# Patient Record
Sex: Male | Born: 1935 | Race: White | Hispanic: No | Marital: Married | Smoking: Former smoker
Health system: Southern US, Community
[De-identification: ages and names within clinical notes are randomized; demographics above are authoritative.]

## PROBLEM LIST (undated history)

## (undated) DIAGNOSIS — E7800 Pure hypercholesterolemia, unspecified: Secondary | ICD-10-CM

## (undated) DIAGNOSIS — IMO0002 Reserved for concepts with insufficient information to code with codable children: Secondary | ICD-10-CM

## (undated) DIAGNOSIS — R011 Cardiac murmur, unspecified: Secondary | ICD-10-CM

## (undated) DIAGNOSIS — M109 Gout, unspecified: Secondary | ICD-10-CM

## (undated) DIAGNOSIS — I10 Essential (primary) hypertension: Secondary | ICD-10-CM

## (undated) DIAGNOSIS — I714 Abdominal aortic aneurysm, without rupture, unspecified: Secondary | ICD-10-CM

## (undated) DIAGNOSIS — I4891 Unspecified atrial fibrillation: Secondary | ICD-10-CM

## (undated) DIAGNOSIS — I4892 Unspecified atrial flutter: Secondary | ICD-10-CM

## (undated) DIAGNOSIS — D126 Benign neoplasm of colon, unspecified: Secondary | ICD-10-CM

## (undated) DIAGNOSIS — R768 Other specified abnormal immunological findings in serum: Secondary | ICD-10-CM

## (undated) DIAGNOSIS — K529 Noninfective gastroenteritis and colitis, unspecified: Secondary | ICD-10-CM

## (undated) DIAGNOSIS — Z8711 Personal history of peptic ulcer disease: Secondary | ICD-10-CM

## (undated) DIAGNOSIS — Z8719 Personal history of other diseases of the digestive system: Secondary | ICD-10-CM

## (undated) HISTORY — DX: Abdominal aortic aneurysm, without rupture, unspecified: I71.40

## (undated) HISTORY — DX: Pure hypercholesterolemia, unspecified: E78.00

## (undated) HISTORY — PX: HEMORRHOID SURGERY: SHX153

## (undated) HISTORY — DX: Gout, unspecified: M10.9

## (undated) HISTORY — PX: OTHER SURGICAL HISTORY: SHX169

## (undated) HISTORY — DX: Unspecified atrial fibrillation: I48.91

## (undated) HISTORY — DX: Other specified abnormal immunological findings in serum: R76.8

## (undated) HISTORY — DX: Reserved for concepts with insufficient information to code with codable children: IMO0002

## (undated) HISTORY — DX: Unspecified atrial flutter: I48.92

## (undated) HISTORY — DX: Essential (primary) hypertension: I10

## (undated) HISTORY — DX: Personal history of peptic ulcer disease: Z87.11

## (undated) HISTORY — DX: Cardiac murmur, unspecified: R01.1

## (undated) HISTORY — DX: Benign neoplasm of colon, unspecified: D12.6

---
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
## 1998-02-24 DIAGNOSIS — D126 Benign neoplasm of colon, unspecified: Secondary | ICD-10-CM

## 1998-02-24 HISTORY — DX: Benign neoplasm of colon, unspecified: D12.6

## 2010-07-23 ENCOUNTER — Telehealth: Payer: Self-pay

## 2010-07-23 NOTE — Telephone Encounter (Signed)
Pt called back to schedule colonoscopy. He has hx of polyps. Appt made to see Lorenza Burton, NP on 07/29/2010 @ 11:00 AM. Pt was asked to bring his insurance cards and  Medications/list and arrive 15 min early.

## 2010-07-26 HISTORY — PX: COLONOSCOPY: SHX174

## 2010-07-29 ENCOUNTER — Ambulatory Visit (INDEPENDENT_AMBULATORY_CARE_PROVIDER_SITE_OTHER): Payer: Medicare Other | Admitting: Urgent Care

## 2010-07-29 ENCOUNTER — Encounter: Payer: Self-pay | Admitting: Urgent Care

## 2010-07-29 VITALS — BP 120/68 | HR 55 | Temp 97.8°F | Ht 69.0 in | Wt 194.2 lb

## 2010-07-29 DIAGNOSIS — Z86010 Personal history of colonic polyps: Secondary | ICD-10-CM | POA: Insufficient documentation

## 2010-07-29 DIAGNOSIS — D126 Benign neoplasm of colon, unspecified: Secondary | ICD-10-CM

## 2010-07-29 NOTE — Assessment & Plan Note (Deleted)
Juan Quinn is a 77 y.o. male w/ hx adenomatous colon polyps found 12 years ago.  Due for surveillance colonoscopy.  Denies any GI concerns today.  Colonoscopy w/ Dr Rourk in the near future.     I have discussed risks & benefits which include, but are not limited to, bleeding, infection, perforation & drug reaction.  The patient agrees with this plan & written consent will be obtained.    

## 2010-07-29 NOTE — Assessment & Plan Note (Signed)
Juan Quinn is a 77 y.o. male w/ hx adenomatous colon polyps found 12 years ago.  Due for surveillance colonoscopy.  Denies any GI concerns today.  Colonoscopy w/ Dr Jena Gauss in the near future.     I have discussed risks & benefits which include, but are not limited to, bleeding, infection, perforation & drug reaction.  The patient agrees with this plan & written consent will be obtained.

## 2010-07-29 NOTE — Progress Notes (Signed)
Cc to PCP 

## 2010-07-29 NOTE — Progress Notes (Signed)
Primary Care Physician:  Isabella Stalling, MD Primary Gastroenterologist:  Dr. Jena Gauss  Chief Complaint  Patient presents with  . Colon Cancer Screening    hx polyps    HPI:  Juan Quinn is a 77 y.o. male here as a referral from Dr. Janna Arch for colonoscopy.  12 yr ago dx colon polyps & had 3-4 polyps removed.  Has had all normal colonoscopies since then about every 5 yr in [place], [place] Dr. Rubye Oaks (surgeon).  Denies abd pain, nausea, vomiting, diarrhea, or constipation.  Wt stable.  Appetite ok.  Denies heartburn or indigestion.     Past Medical History  Diagnosis Date  . HTN (hypertension)   . Hypercholesteremia   . Squamous cell carcinoma   . Adenomatous colon polyp 2000    Last colonoscopy [facility] 03/24/03 Dr. Jen Mow normal except 1 benign polyp. states 3-4 polyps removed 12  yrs ago    Past Surgical History  Procedure Date  . Shoulder   . Hemorrhoid surgery     Current Outpatient Prescriptions  Medication Sig Dispense Refill  . allopurinol (ZYLOPRIM) 100 MG tablet Take 100 mg by mouth daily.        Marland Kitchen aspirin 81 MG tablet Take 81 mg by mouth daily.        Marland Kitchen atenolol (TENORMIN) 25 MG tablet Take 25 mg by mouth daily.        . Cholecalciferol (VITAMIN D3) 1000 UNITS CAPS Take 1 capsule by mouth daily.        . Cyanocobalamin (B-12 PO) Take 500 mcg by mouth daily.        Marland Kitchen docusate sodium (COLACE) 100 MG capsule Take 100 mg by mouth 2 (two) times daily.        . fish oil-omega-3 fatty acids 1000 MG capsule Take 1 g by mouth daily.        Marland Kitchen lisinopril (PRINIVIL,ZESTRIL) 10 MG tablet Take 10 mg by mouth daily.        . simvastatin (ZOCOR) 40 MG tablet Take 40 mg by mouth at bedtime.          Allergies as of 07/29/2010  . (No Known Allergies)    Family History  Problem Relation Age of Onset  . Pancreatic cancer Sister   . Leukemia Brother   . Lung cancer Sister   . Stroke Mother   . Stroke Father     History   Social History  . Marital Status: Married   Spouse Name: N/A    Number of Children: 4  . Years of Education: N/A   Occupational History  . retired     Airline pilot   Social History Main Topics  . Smoking status: Former Smoker -- 2.0 packs/day for 50 years    Types: Cigarettes    Quit date: 07/28/2000  . Smokeless tobacco: Former Neurosurgeon    Quit date: 02/25/1999  . Alcohol Use: Yes     couple glasses wine/week, occ beer o rmixed drink  . Drug Use: 2 per week  . Sexually Active: Not on file   Other Topics Concern  . Not on file   Social History Narrative   Lives w/ wife    Review of Systems: Gen: Denies any fever, chills, sweats, anorexia, fatigue, weakness, malaise, weight loss, and sleep disorder CV: Denies chest pain, angina, palpitations, syncope, orthopnea, PND, peripheral edema, and claudication. Resp: Denies dyspnea at rest, dyspnea with exercise, cough, sputum, wheezing, coughing up blood, and pleurisy. GI: Denies vomiting blood, jaundice, and  fecal incontinence.   Denies dysphagia or odynophagia. GU : Denies urinary burning, blood in urine, urinary frequency, urinary hesitancy, nocturnal urination, and urinary incontinence. MS: Denies joint pain, limitation of movement, and swelling, stiffness, low back pain, extremity pain. Denies muscle weakness, cramps, atrophy.  Derm: Denies rash, itching, dry skin, hives, moles, warts, or unhealing ulcers.  Psych: Denies depression, anxiety, memory loss, suicidal ideation, hallucinations, paranoia, and confusion. Heme: Denies bruising, bleeding, and enlarged lymph nodes.  Physical Exam: BP 120/68  Pulse 55  Temp(Src) 97.8 F (36.6 C) (Temporal)  Ht 5\' 9"  (1.753 m)  Wt 194 lb 3.2 oz (88.089 kg)  BMI 28.68 kg/m2 General:   Alert,  Well-developed, well-nourished, pleasant and cooperative in NAD Head:  Normocephalic and atraumatic. Eyes:  Sclera clear, no icterus.   Conjunctiva pink. Ears:  Normal auditory acuity. Nose:  No deformity, discharge,  or lesions. Mouth:  No deformity  or lesions, dentition normal. Neck:  Supple; no masses or thyromegaly. Lungs:  Clear throughout to auscultation.   No wheezes, crackles, or rhonchi. No acute distress. Heart:  Regular rate and rhythm; no murmurs, clicks, rubs,  or gallops. Abdomen:  Soft, nontender and nondistended. No masses, hepatosplenomegaly or hernias noted. Normal bowel sounds, without guarding, and without rebound.   Rectal:  Deferred until time of colonoscopy.   Msk:  Symmetrical without gross deformities. Normal posture. Pulses:  Normal pulses noted. Extremities:  Without clubbing or edema. Neurologic:  Alert and  oriented x4;  grossly normal neurologically. Skin:  Intact without significant lesions or rashes. Cervical Nodes:  No significant cervical adenopathy. Psych:  Alert and cooperative. Normal mood and affect.

## 2010-08-07 ENCOUNTER — Encounter: Payer: Medicare Other | Admitting: Internal Medicine

## 2010-08-07 ENCOUNTER — Ambulatory Visit (HOSPITAL_COMMUNITY)
Admission: RE | Admit: 2010-08-07 | Discharge: 2010-08-07 | Disposition: A | Discharge status: Home / Self Care | Payer: Medicare Other | Source: Ambulatory Visit | Attending: Internal Medicine | Admitting: Internal Medicine

## 2010-08-07 ENCOUNTER — Other Ambulatory Visit: Payer: Self-pay | Admitting: Internal Medicine

## 2010-08-07 DIAGNOSIS — K5730 Diverticulosis of large intestine without perforation or abscess without bleeding: Secondary | ICD-10-CM | POA: Insufficient documentation

## 2010-08-07 DIAGNOSIS — Z860100 Personal history of colon polyps, unspecified: Secondary | ICD-10-CM

## 2010-08-07 DIAGNOSIS — D126 Benign neoplasm of colon, unspecified: Secondary | ICD-10-CM | POA: Insufficient documentation

## 2010-08-07 DIAGNOSIS — Z7982 Long term (current) use of aspirin: Secondary | ICD-10-CM | POA: Insufficient documentation

## 2010-08-07 DIAGNOSIS — Z1211 Encounter for screening for malignant neoplasm of colon: Secondary | ICD-10-CM

## 2010-08-07 DIAGNOSIS — I10 Essential (primary) hypertension: Secondary | ICD-10-CM | POA: Insufficient documentation

## 2010-08-07 DIAGNOSIS — Z79899 Other long term (current) drug therapy: Secondary | ICD-10-CM | POA: Insufficient documentation

## 2010-08-07 DIAGNOSIS — E785 Hyperlipidemia, unspecified: Secondary | ICD-10-CM | POA: Insufficient documentation

## 2010-08-07 DIAGNOSIS — Z09 Encounter for follow-up examination after completed treatment for conditions other than malignant neoplasm: Secondary | ICD-10-CM | POA: Insufficient documentation

## 2010-08-09 ENCOUNTER — Encounter: Payer: Self-pay | Admitting: Internal Medicine

## 2010-08-11 ENCOUNTER — Encounter: Payer: Self-pay | Admitting: Internal Medicine

## 2010-09-09 NOTE — Op Note (Signed)
  NAMEKORDELL, JAFRI                   ACCOUNT NO.:  [id number]  MEDICAL RECORD NO.:  [id number]  LOCATION:  [facility]                          FACILITY:  [facility]  PHYSICIAN:  R. Roetta Sessions, MD FACP FACGDATE OF BIRTH:  1930/09/23  DATE OF PROCEDURE:  08/07/2010 DATE OF DISCHARGE:                              OPERATIVE REPORT   PROCEDURE:  Colonoscopy with biopsy and snare polypectomy.  HISTORY:  A 77 year old gentleman referred by Dr. Janna Arch for colonoscopy.  The patient has had a personal history of colonic polyps. He has had multiple colonoscopies elsewhere (particularly [place]).  Last exam was about 5 years ago.  Colonoscopy is now being done as surveillance maneuver.  Currently, no lower GI tract symptoms. Risks, benefits, limitations, alternatives, imponderables have been discussed, questions answered.  Please see the documentation medical record.  PROCEDURE NOTE:  O2 saturation, blood pressure, pulse, and respirations were monitored throughout the entirety of the procedure.  CONSCIOUS SEDATION:  Versed 4 mg IV and Demerol 50 mg IV in divided doses.  INSTRUMENT:  Pentax video chip system.  FINDINGS:  Digital rectal exam revealed no abnormalities.  Endoscopic findings, prep was adequate.  Colon:  Colonic mucosa surveyed from the rectosigmoid junction through the left transverse right colon to the appendiceal orifice, ileocecal valve/cecum.  These structures were well seen and photographed for the record.  From this level, scope was slowly withdrawn and all previously mentioned mucosal surfaces were again seen. The fascia and long redundant colon requiring external abdominal pressure to reach the cecum.  The patient had pancolonic diverticula. The patient had 2 diminutive polyps in the cecum which were cold biopsied/removed.  At the hepatic flexure, descending segments over 5 mm polyps and soft stricture cold snared.  Remaining colonic mucosa appeared normal.  Scope was  pulled down in the rectum where thorough examination of the rectal mucosa including retroflex view of the anal verge demonstrated no abnormalities.  The patient tolerated the procedure well.  Cecal withdrawal time 15 minutes.  IMPRESSION: 1. Normal rectum. 2. Redundant elongated colon, pancolonic diverticular disease.  Cecal,     hepatic flexure, and descending colon polyps removed as described     above.  RECOMMENDATIONS: 1. Polyp and diverticulosis literature provided to Mr. Kollman. 2. Follow up on path. 3. Further recommendations to follow.     Jonathon Bellows, MD [facility]     RMR/MEDQ  D:  08/07/2010  T:  08/07/2010  Job:  [id number]  cc:   Melvyn Novas, MD Fax: [phone]  Electronically Signed by Lorrin Goodell M.D. on 09/09/2010 09:12:18 AM

## 2010-10-01 ENCOUNTER — Emergency Department (HOSPITAL_COMMUNITY): Payer: Medicare Other

## 2010-10-01 ENCOUNTER — Encounter (HOSPITAL_COMMUNITY): Payer: Self-pay | Admitting: *Deleted

## 2010-10-01 ENCOUNTER — Emergency Department (HOSPITAL_COMMUNITY)
Admission: EM | Admit: 2010-10-01 | Discharge: 2010-10-02 | Disposition: A | Discharge status: Home / Self Care | Payer: Medicare Other | Attending: Emergency Medicine | Admitting: Emergency Medicine

## 2010-10-01 DIAGNOSIS — Z859 Personal history of malignant neoplasm, unspecified: Secondary | ICD-10-CM | POA: Insufficient documentation

## 2010-10-01 DIAGNOSIS — E7800 Pure hypercholesterolemia, unspecified: Secondary | ICD-10-CM | POA: Insufficient documentation

## 2010-10-01 DIAGNOSIS — Z860100 Personal history of colon polyps, unspecified: Secondary | ICD-10-CM | POA: Insufficient documentation

## 2010-10-01 DIAGNOSIS — Y92009 Unspecified place in unspecified non-institutional (private) residence as the place of occurrence of the external cause: Secondary | ICD-10-CM | POA: Insufficient documentation

## 2010-10-01 DIAGNOSIS — S43016A Anterior dislocation of unspecified humerus, initial encounter: Secondary | ICD-10-CM | POA: Insufficient documentation

## 2010-10-01 DIAGNOSIS — I10 Essential (primary) hypertension: Secondary | ICD-10-CM | POA: Insufficient documentation

## 2010-10-01 DIAGNOSIS — Z87891 Personal history of nicotine dependence: Secondary | ICD-10-CM | POA: Insufficient documentation

## 2010-10-01 DIAGNOSIS — W010XXA Fall on same level from slipping, tripping and stumbling without subsequent striking against object, initial encounter: Secondary | ICD-10-CM | POA: Insufficient documentation

## 2010-10-01 DIAGNOSIS — S43005A Unspecified dislocation of left shoulder joint, initial encounter: Secondary | ICD-10-CM

## 2010-10-01 MED ORDER — ONDANSETRON HCL 4 MG/2ML IJ SOLN
4.0000 mg | Freq: Once | INTRAMUSCULAR | Status: AC
  Administered 2010-10-01: 4 mg via INTRAVENOUS

## 2010-10-01 MED ORDER — ETOMIDATE 2 MG/ML IV SOLN: 10.0000 mg | Freq: Once | INTRAVENOUS | Status: DC

## 2010-10-01 MED ORDER — ONDANSETRON HCL 4 MG/2ML IJ SOLN
INTRAMUSCULAR | Status: AC
  Administered 2010-10-01: 4 mg
  Filled 2010-10-01: qty 2

## 2010-10-01 MED ORDER — HYDROMORPHONE HCL 1 MG/ML IJ SOLN
INTRAMUSCULAR | Status: AC
  Administered 2010-10-01: 1 mg
  Filled 2010-10-01: qty 1

## 2010-10-01 MED ORDER — ETOMIDATE 2 MG/ML IV SOLN
INTRAVENOUS | Status: AC
  Administered 2010-10-01: 10 mg
  Filled 2010-10-01: qty 10

## 2010-10-01 MED ORDER — HYDROMORPHONE HCL 1 MG/ML IJ SOLN
1.0000 mg | Freq: Once | INTRAMUSCULAR | Status: AC
  Administered 2010-10-01: 1 mg via INTRAVENOUS
  Filled 2010-10-01: qty 1

## 2010-10-01 NOTE — ED Provider Notes (Signed)
History     CSN: [id number] Arrival date & time: 10/01/2010 10:05 PM  Chief Complaint  Patient presents with  . Illegal value: [    poss. dislocated shoulder   HPI Comments: Patient is a 77 year old male who presents with left shoulder injury approximately 1.5 hours prior to arrival. He states that he was pushing his garbage can when he tripped and fell on an outstretched hand with a straight elbow causing acute onset pain in his left shoulder. Pain is constant, moderate, worse with movement and associated with mild tingling of the fingers. He states that he has had this happen one time in the past. He denies other injuries at this time. He has had no medications prior to arrival  The history is provided by the patient and the spouse.    Past Medical History  Diagnosis Date  . HTN (hypertension)   . Hypercholesteremia   . Squamous cell carcinoma   . Adenomatous colon polyp 2000    Last colonoscopy [facility] 03/24/03 Dr. Jen Mow normal except 1 benign polyp. states 3-4 polyps removed 12  yrs ago    Past Surgical History  Procedure Date  . Shoulder   . Hemorrhoid surgery     Family History  Problem Relation Age of Onset  . Pancreatic cancer Sister   . Leukemia Brother   . Lung cancer Sister   . Stroke Mother   . Stroke Father     History  Substance Use Topics  . Smoking status: Former Smoker -- 2.0 packs/day for 50 years    Types: Cigarettes    Quit date: 07/28/2000  . Smokeless tobacco: Former Neurosurgeon    Quit date: 02/25/1999  . Alcohol Use: Yes     couple glasses wine/week, occ beer o rmixed drink      Review of Systems  Constitutional: Negative for fever and chills.  HENT: Negative for sore throat and neck pain.   Eyes: Negative for visual disturbance.  Respiratory: Negative for cough and shortness of breath.   Cardiovascular: Negative for chest pain.  Gastrointestinal: Negative for nausea, vomiting, abdominal pain and diarrhea.  Genitourinary: Negative for  dysuria and frequency.  Musculoskeletal: Negative for back pain.       Shoulder pain  Skin: Negative for rash.  Neurological: Positive for numbness. Negative for weakness and headaches.  Hematological: Negative for adenopathy.  Psychiatric/Behavioral: Negative for behavioral problems.    Physical Exam  BP 144/61  Pulse 50  Temp(Src) 98.2 F (36.8 C) (Oral)  Resp 20  Ht 5\' 9"  (1.753 m)  Wt 190 lb (86.183 kg)  BMI 28.06 kg/m2  SpO2 95%  Physical Exam  Nursing note and vitals reviewed. Constitutional: He appears well-developed and well-nourished. No distress.  HENT:  Head: Normocephalic and atraumatic.  Mouth/Throat: Oropharynx is clear and moist. No oropharyngeal exudate.  Eyes: Conjunctivae and EOM are normal. Pupils are equal, round, and reactive to light. Right eye exhibits no discharge. Left eye exhibits no discharge. No scleral icterus.  Neck: Normal range of motion. Neck supple. No JVD present. No thyromegaly present.  Cardiovascular: Normal rate, regular rhythm, normal heart sounds and intact distal pulses.  Exam reveals no gallop and no friction rub.   No murmur heard. Pulmonary/Chest: Effort normal and breath sounds normal. No respiratory distress. He has no wheezes. He has no rales.  Abdominal: Soft. Bowel sounds are normal. He exhibits no distension and no mass. There is no tenderness.  Musculoskeletal: Normal range of motion. He exhibits tenderness. He  exhibits no edema.       Left shoulder deformity with decreased range of motion of the left arm. Normal pulses at the left radial artery, normal grip the left hand. No other extremity injuries.  Lymphadenopathy:    He has no cervical adenopathy.  Neurological: He is alert. Coordination normal.       Sensation intact the left hand at this time.  Skin: Skin is warm and dry. No rash noted. No erythema.  Psychiatric: He has a normal mood and affect. His behavior is normal.    ED Course  Reduction of  dislocation Date/Time: 10/01/2010 11:34 PM Performed by: Eber Hong D Authorized by: Eber Hong D Consent: Verbal consent obtained. Written consent obtained. Risks and benefits: risks, benefits and alternatives were discussed Consent given by: patient Patient understanding: patient states understanding of the procedure being performed Patient consent: the patient's understanding of the procedure matches consent given Procedure consent: procedure consent matches procedure scheduled Relevant documents: relevant documents present and verified Test results: test results available and properly labeled Imaging studies: imaging studies available Patient identity confirmed: verbally with patient and arm band Time out: Immediately prior to procedure a "time out" was called to verify the correct patient, procedure, equipment, support staff and site/side marked as required. Patient sedated: yes Sedation type: moderate (conscious) sedation Sedatives: etomidate Sedation start date/time: 10/01/2010 11:20 PM Sedation end date/time: 10/01/2010 11:35 PM Vitals: Vital signs were monitored during sedation. Patient tolerance: Patient tolerated the procedure well with no immediate complications. Comments: Dislocation of left shoulder reduced with traction countertraction and manipulation of the left shoulder. The shoulder was relocated easily and without any discomfort. Patient arose from sedation at his baseline mental status. Post reduction films ordered.    MDM Apparent left shoulder dislocation, will confirm with x-ray, opiate analgesia was given intravenously. Patient moved from hallway into examination room from likely relocation procedure.  Dg Shoulder Left  10/01/2010  *RADIOLOGY REPORT*  Clinical Data: Larey Seat.  Left shoulder pain.  LEFT SHOULDER - 2+ VIEW  Comparison: None  Findings: There is an anterior subcoracoid dislocation of the left humeral head.  No definite associated fracture.  The left lung  apex is clear.  IMPRESSION: Anterior subcoracoid dislocation of the humeral head.  Original Report Authenticated By: P. Loralie Champagne, M.D.    Postreduction films reveal normal alignment without fracture. I have personally placed a shoulder immobilizer the patient tolerated without difficulty. Sensation is completely worn off the patient is ready for discharge.  Vida Roller, MD 10/02/10 [phone]

## 2010-10-01 NOTE — ED Notes (Signed)
Patient thinks he may have dislocated left shoulder, unable to move arm

## 2010-10-02 MED ORDER — ONDANSETRON HCL 4 MG/2ML IJ SOLN
INTRAMUSCULAR | Status: AC
  Administered 2010-10-02: 4 mg
  Filled 2010-10-02: qty 2

## 2010-10-02 MED ORDER — HYDROCODONE-ACETAMINOPHEN 5-500 MG PO TABS: 1.0000 | ORAL_TABLET | Freq: Four times a day (QID) | ORAL | Status: AC | PRN

## 2012-12-14 ENCOUNTER — Other Ambulatory Visit (HOSPITAL_COMMUNITY): Payer: Self-pay | Admitting: *Deleted

## 2012-12-14 ENCOUNTER — Other Ambulatory Visit (HOSPITAL_COMMUNITY): Payer: Self-pay | Admitting: Family Medicine

## 2012-12-14 DIAGNOSIS — N644 Mastodynia: Secondary | ICD-10-CM

## 2012-12-29 ENCOUNTER — Ambulatory Visit (HOSPITAL_COMMUNITY)
Admission: RE | Admit: 2012-12-29 | Discharge: 2012-12-29 | Disposition: A | Discharge status: Home / Self Care | Payer: Medicare Other | Source: Ambulatory Visit | Attending: Family Medicine | Admitting: Family Medicine

## 2012-12-29 DIAGNOSIS — N644 Mastodynia: Secondary | ICD-10-CM

## 2012-12-29 DIAGNOSIS — N62 Hypertrophy of breast: Secondary | ICD-10-CM | POA: Insufficient documentation

## 2013-02-24 DIAGNOSIS — R768 Other specified abnormal immunological findings in serum: Secondary | ICD-10-CM

## 2013-02-24 HISTORY — DX: Other specified abnormal immunological findings in serum: R76.8

## 2013-05-23 ENCOUNTER — Inpatient Hospital Stay (HOSPITAL_COMMUNITY)
Admission: EM | Admit: 2013-05-23 | Discharge: 2013-05-27 | DRG: 378 | Disposition: A | Discharge status: Home / Self Care | Payer: Medicare Other | Attending: Family Medicine | Admitting: Family Medicine

## 2013-05-23 ENCOUNTER — Encounter (HOSPITAL_COMMUNITY): Payer: Self-pay | Admitting: Emergency Medicine

## 2013-05-23 ENCOUNTER — Encounter (HOSPITAL_COMMUNITY)
Admission: EM | Disposition: A | Discharge status: Home / Self Care | Payer: Self-pay | Source: Home / Self Care | Attending: Family Medicine

## 2013-05-23 DIAGNOSIS — K279 Peptic ulcer, site unspecified, unspecified as acute or chronic, without hemorrhage or perforation: Secondary | ICD-10-CM

## 2013-05-23 DIAGNOSIS — Z66 Do not resuscitate: Secondary | ICD-10-CM | POA: Diagnosis present

## 2013-05-23 DIAGNOSIS — T3995XA Adverse effect of unspecified nonopioid analgesic, antipyretic and antirheumatic, initial encounter: Secondary | ICD-10-CM | POA: Diagnosis present

## 2013-05-23 DIAGNOSIS — I959 Hypotension, unspecified: Secondary | ICD-10-CM | POA: Diagnosis present

## 2013-05-23 DIAGNOSIS — Z79899 Other long term (current) drug therapy: Secondary | ICD-10-CM

## 2013-05-23 DIAGNOSIS — K259 Gastric ulcer, unspecified as acute or chronic, without hemorrhage or perforation: Secondary | ICD-10-CM | POA: Diagnosis present

## 2013-05-23 DIAGNOSIS — K254 Chronic or unspecified gastric ulcer with hemorrhage: Principal | ICD-10-CM | POA: Diagnosis present

## 2013-05-23 DIAGNOSIS — Z7982 Long term (current) use of aspirin: Secondary | ICD-10-CM

## 2013-05-23 DIAGNOSIS — E7800 Pure hypercholesterolemia, unspecified: Secondary | ICD-10-CM | POA: Diagnosis present

## 2013-05-23 DIAGNOSIS — K922 Gastrointestinal hemorrhage, unspecified: Secondary | ICD-10-CM

## 2013-05-23 DIAGNOSIS — K920 Hematemesis: Secondary | ICD-10-CM | POA: Diagnosis present

## 2013-05-23 DIAGNOSIS — N400 Enlarged prostate without lower urinary tract symptoms: Secondary | ICD-10-CM | POA: Diagnosis present

## 2013-05-23 DIAGNOSIS — D126 Benign neoplasm of colon, unspecified: Secondary | ICD-10-CM

## 2013-05-23 DIAGNOSIS — I10 Essential (primary) hypertension: Secondary | ICD-10-CM | POA: Diagnosis present

## 2013-05-23 DIAGNOSIS — Z806 Family history of leukemia: Secondary | ICD-10-CM

## 2013-05-23 DIAGNOSIS — Z801 Family history of malignant neoplasm of trachea, bronchus and lung: Secondary | ICD-10-CM

## 2013-05-23 DIAGNOSIS — K449 Diaphragmatic hernia without obstruction or gangrene: Secondary | ICD-10-CM | POA: Diagnosis present

## 2013-05-23 DIAGNOSIS — E785 Hyperlipidemia, unspecified: Secondary | ICD-10-CM | POA: Diagnosis present

## 2013-05-23 DIAGNOSIS — Z87891 Personal history of nicotine dependence: Secondary | ICD-10-CM

## 2013-05-23 DIAGNOSIS — Z800 Family history of malignant neoplasm of digestive organs: Secondary | ICD-10-CM

## 2013-05-23 DIAGNOSIS — Z823 Family history of stroke: Secondary | ICD-10-CM

## 2013-05-23 DIAGNOSIS — D62 Acute posthemorrhagic anemia: Secondary | ICD-10-CM | POA: Diagnosis present

## 2013-05-23 HISTORY — PX: ESOPHAGOGASTRODUODENOSCOPY: SHX5428

## 2013-05-23 LAB — COMPREHENSIVE METABOLIC PANEL
ALT: 7 U/L (ref 0–53)
AST: 12 U/L (ref 0–37)
Albumin: 2.6 g/dL — ABNORMAL LOW (ref 3.5–5.2)
Alkaline Phosphatase: 35 U/L — ABNORMAL LOW (ref 39–117)
BUN: 57 mg/dL — ABNORMAL HIGH (ref 6–23)
CALCIUM: 8.7 mg/dL (ref 8.4–10.5)
CO2: 26 mEq/L (ref 19–32)
CREATININE: 1.18 mg/dL (ref 0.50–1.35)
Chloride: 109 mEq/L (ref 96–112)
GFR calc non Af Amer: 56 mL/min — ABNORMAL LOW (ref >90)
GFR, EST AFRICAN AMERICAN: 65 mL/min — AB (ref >90)
GLUCOSE: 179 mg/dL — AB (ref 70–99)
Potassium: 5 mEq/L (ref 3.7–5.3)
SODIUM: 144 meq/L (ref 137–147)
TOTAL PROTEIN: 4.8 g/dL — AB (ref 6.0–8.3)
Total Bilirubin: 0.3 mg/dL (ref 0.3–1.2)

## 2013-05-23 LAB — CBC WITH DIFFERENTIAL/PLATELET
BASOS ABS: 0 10*3/uL (ref 0.0–0.1)
Basophils Relative: 0 % (ref 0–1)
EOS ABS: 0.2 10*3/uL (ref 0.0–0.7)
EOS PCT: 2 % (ref 0–5)
HCT: 27.4 % — ABNORMAL LOW (ref 39.0–52.0)
Hemoglobin: 9.5 g/dL — ABNORMAL LOW (ref 13.0–17.0)
LYMPHS ABS: 2.2 10*3/uL (ref 0.7–4.0)
Lymphocytes Relative: 25 % (ref 12–46)
MCH: 36.1 pg — AB (ref 26.0–34.0)
MCHC: 34.7 g/dL (ref 30.0–36.0)
MCV: 104.2 fL — AB (ref 78.0–100.0)
MONO ABS: 0.4 10*3/uL (ref 0.1–1.0)
Monocytes Relative: 4 % (ref 3–12)
Neutro Abs: 5.9 10*3/uL (ref 1.7–7.7)
Neutrophils Relative %: 69 % (ref 43–77)
PLATELETS: 119 10*3/uL — AB (ref 150–400)
RBC: 2.63 MIL/uL — ABNORMAL LOW (ref 4.22–5.81)
RDW: 12.7 % (ref 11.5–15.5)
WBC: 8.6 10*3/uL (ref 4.0–10.5)

## 2013-05-23 LAB — CBC
HEMATOCRIT: 26.2 % — AB (ref 39.0–52.0)
HEMATOCRIT: 25.8 % — AB (ref 39.0–52.0)
HEMOGLOBIN: 9.1 g/dL — AB (ref 13.0–17.0)
Hemoglobin: 9 g/dL — ABNORMAL LOW (ref 13.0–17.0)
MCH: 36 pg — ABNORMAL HIGH (ref 26.0–34.0)
MCH: 34.1 pg — AB (ref 26.0–34.0)
MCHC: 34.7 g/dL (ref 30.0–36.0)
MCHC: 34.9 g/dL (ref 30.0–36.0)
MCV: 103.6 fL — ABNORMAL HIGH (ref 78.0–100.0)
MCV: 97.7 fL (ref 78.0–100.0)
Platelets: 116 10*3/uL — ABNORMAL LOW (ref 150–400)
Platelets: 100 10*3/uL — ABNORMAL LOW (ref 150–400)
RBC: 2.53 MIL/uL — ABNORMAL LOW (ref 4.22–5.81)
RBC: 2.64 MIL/uL — AB (ref 4.22–5.81)
RDW: 12.7 % (ref 11.5–15.5)
RDW: 17.2 % — ABNORMAL HIGH (ref 11.5–15.5)
WBC: 9.8 10*3/uL (ref 4.0–10.5)
WBC: 7.8 10*3/uL (ref 4.0–10.5)

## 2013-05-23 LAB — LIPASE, BLOOD: LIPASE: 32 U/L (ref 11–59)

## 2013-05-23 LAB — APTT: APTT: 26 s (ref 24–37)

## 2013-05-23 LAB — LACTIC ACID, PLASMA: Lactic Acid, Venous: 4.2 mmol/L — ABNORMAL HIGH (ref 0.5–2.2)

## 2013-05-23 LAB — MRSA PCR SCREENING: MRSA by PCR: NEGATIVE

## 2013-05-23 LAB — PROTIME-INR
INR: 1.17 (ref 0.00–1.49)
PROTHROMBIN TIME: 14.7 s (ref 11.6–15.2)

## 2013-05-23 LAB — ABO/RH: ABO/RH(D): A POS

## 2013-05-23 LAB — POC OCCULT BLOOD, ED: FECAL OCCULT BLD: POSITIVE — AB

## 2013-05-23 SURGERY — EGD (ESOPHAGOGASTRODUODENOSCOPY)
Anesthesia: Moderate Sedation

## 2013-05-23 MED ORDER — METOCLOPRAMIDE HCL 5 MG/ML IJ SOLN
INTRAMUSCULAR | Status: AC
  Filled 2013-05-23: qty 2

## 2013-05-23 MED ORDER — SODIUM CHLORIDE 0.9 % IV SOLN
INTRAVENOUS | Status: AC
  Administered 2013-05-23: 12:00:00 via INTRAVENOUS

## 2013-05-23 MED ORDER — PANTOPRAZOLE SODIUM 40 MG IV SOLR
40.0000 mg | Freq: Once | INTRAVENOUS | Status: AC
  Administered 2013-05-23: 40 mg via INTRAVENOUS

## 2013-05-23 MED ORDER — MIDAZOLAM HCL 5 MG/5ML IJ SOLN
INTRAMUSCULAR | Status: AC
  Filled 2013-05-23: qty 5

## 2013-05-23 MED ORDER — SODIUM CHLORIDE 0.9 % IV SOLN
INTRAVENOUS | Status: DC
Start: 2013-05-23 — End: 2013-05-27
  Administered 2013-05-24 (×2): via INTRAVENOUS

## 2013-05-23 MED ORDER — SODIUM CHLORIDE 0.9 % IV SOLN
8.0000 mg/h | INTRAVENOUS | Status: DC
  Filled 2013-05-23 (×3): qty 80

## 2013-05-23 MED ORDER — PANTOPRAZOLE SODIUM 40 MG IV SOLR: 40.0000 mg | Freq: Two times a day (BID) | INTRAVENOUS | Status: DC

## 2013-05-23 MED ORDER — IPRATROPIUM BROMIDE 0.03 % NA SOLN
2.0000 | Freq: Three times a day (TID) | NASAL | Status: DC
  Administered 2013-05-24 – 2013-05-26 (×5): 2 via NASAL
  Filled 2013-05-23: qty 15

## 2013-05-23 MED ORDER — LIDOCAINE VISCOUS 2 % MT SOLN
OROMUCOSAL | Status: AC
  Filled 2013-05-23: qty 15

## 2013-05-23 MED ORDER — PANTOPRAZOLE SODIUM 40 MG IV SOLR
40.0000 mg | Freq: Once | INTRAVENOUS | Status: AC
  Administered 2013-05-23: 40 mg via INTRAVENOUS
  Filled 2013-05-23: qty 40

## 2013-05-23 MED ORDER — SODIUM CHLORIDE 0.9 % IV BOLUS (SEPSIS)
1000.0000 mL | Freq: Once | INTRAVENOUS | Status: AC
  Administered 2013-05-23: 1000 mL via INTRAVENOUS

## 2013-05-23 MED ORDER — ALLOPURINOL 100 MG PO TABS
100.0000 mg | ORAL_TABLET | Freq: Every day | ORAL | Status: DC
  Administered 2013-05-23 – 2013-05-27 (×5): 100 mg via ORAL
  Filled 2013-05-23 (×6): qty 1

## 2013-05-23 MED ORDER — PANTOPRAZOLE SODIUM 40 MG IV SOLR
40.0000 mg | Freq: Once | INTRAVENOUS | Status: AC
  Filled 2013-05-23: qty 40

## 2013-05-23 MED ORDER — TAMSULOSIN HCL 0.4 MG PO CAPS
0.4000 mg | ORAL_CAPSULE | Freq: Every day | ORAL | Status: DC
  Administered 2013-05-23 – 2013-05-27 (×5): 0.4 mg via ORAL
  Filled 2013-05-23 (×5): qty 1

## 2013-05-23 MED ORDER — ACETAMINOPHEN 325 MG PO TABS
650.0000 mg | ORAL_TABLET | Freq: Four times a day (QID) | ORAL | Status: DC | PRN
Start: 2013-05-23 — End: 2013-05-27

## 2013-05-23 MED ORDER — MIDAZOLAM HCL 5 MG/5ML IJ SOLN
INTRAMUSCULAR | Status: DC | PRN
  Administered 2013-05-23 (×4): 1 mg via INTRAVENOUS
  Administered 2013-05-23: 2 mg via INTRAVENOUS
  Administered 2013-05-23 (×5): 1 mg via INTRAVENOUS

## 2013-05-23 MED ORDER — MIDAZOLAM HCL 5 MG/5ML IJ SOLN
INTRAMUSCULAR | Status: AC
  Filled 2013-05-23: qty 10

## 2013-05-23 MED ORDER — STERILE WATER FOR IRRIGATION IR SOLN
Status: DC | PRN
  Administered 2013-05-23: 14:00:00

## 2013-05-23 MED ORDER — METOCLOPRAMIDE HCL 5 MG/ML IJ SOLN
10.0000 mg | Freq: Once | INTRAMUSCULAR | Status: AC
  Administered 2013-05-24: 10 mg via INTRAVENOUS
  Filled 2013-05-23: qty 2

## 2013-05-23 MED ORDER — MEPERIDINE HCL 100 MG/ML IJ SOLN
INTRAMUSCULAR | Status: AC
  Filled 2013-05-23: qty 2

## 2013-05-23 MED ORDER — ONDANSETRON HCL 4 MG/2ML IJ SOLN
4.0000 mg | Freq: Once | INTRAMUSCULAR | Status: AC
  Administered 2013-05-23: 4 mg via INTRAVENOUS
  Filled 2013-05-23: qty 2

## 2013-05-23 MED ORDER — ONDANSETRON HCL 4 MG/2ML IJ SOLN
INTRAMUSCULAR | Status: AC
  Filled 2013-05-23: qty 2

## 2013-05-23 MED ORDER — METOCLOPRAMIDE HCL 5 MG/ML IJ SOLN
10.0000 mg | Freq: Once | INTRAMUSCULAR | Status: AC
  Administered 2013-05-23: 10 mg via INTRAVENOUS

## 2013-05-23 MED ORDER — ONDANSETRON HCL 4 MG/2ML IJ SOLN
INTRAMUSCULAR | Status: DC | PRN
  Administered 2013-05-23: 4 mg via INTRAVENOUS

## 2013-05-23 MED ORDER — SODIUM CHLORIDE 0.9 % IV SOLN
8.0000 mg/h | INTRAVENOUS | Status: DC
  Administered 2013-05-23 – 2013-05-24 (×3): 8 mg/h via INTRAVENOUS
  Filled 2013-05-23 (×5): qty 80

## 2013-05-23 MED ORDER — MEPERIDINE HCL 100 MG/ML IJ SOLN
INTRAMUSCULAR | Status: DC | PRN
  Administered 2013-05-23: 50 mg via INTRAVENOUS
  Administered 2013-05-23 (×2): 25 mg via INTRAVENOUS

## 2013-05-23 MED ORDER — LIDOCAINE VISCOUS 2 % MT SOLN
OROMUCOSAL | Status: DC | PRN
  Administered 2013-05-23: 3 mL via OROMUCOSAL

## 2013-05-23 MED ORDER — TERAZOSIN HCL 5 MG PO CAPS
5.0000 mg | ORAL_CAPSULE | Freq: Once | ORAL | Status: DC
  Filled 2013-05-23: qty 1

## 2013-05-23 MED ORDER — SODIUM CHLORIDE 0.9 % IJ SOLN
INTRAMUSCULAR | Status: AC
Start: 2013-05-23 — End: 2013-05-24
  Filled 2013-05-23: qty 10

## 2013-05-23 MED ORDER — ONDANSETRON HCL 4 MG/2ML IJ SOLN: 4.0000 mg | Freq: Four times a day (QID) | INTRAMUSCULAR | Status: DC | PRN

## 2013-05-23 MED ORDER — PANTOPRAZOLE SODIUM 40 MG IV SOLR: 40.0000 mg | Freq: Once | INTRAVENOUS | Status: DC

## 2013-05-23 MED ORDER — SODIUM CHLORIDE 0.9 % IV SOLN
INTRAVENOUS | Status: DC
  Administered 2013-05-23: 14:00:00 via INTRAVENOUS

## 2013-05-23 MED ORDER — SIMVASTATIN 20 MG PO TABS
20.0000 mg | ORAL_TABLET | Freq: Every day | ORAL | Status: DC
  Administered 2013-05-23 – 2013-05-26 (×4): 20 mg via ORAL
  Filled 2013-05-23 (×4): qty 1

## 2013-05-23 NOTE — ED Notes (Signed)
Patient arrives via EMS from home with c/o gi bleeding. Started this morning. Patient denies recent illness. Patient arrives saturated in bright red blood. Report nausea/vomiting.

## 2013-05-23 NOTE — Consult Note (Addendum)
Referring Provider: Dr. Karle Starch Dr. Primary Care Physician:  Dr. Cindie Laroche Primary Gastroenterologist:  Dr. Gala Romney   Date of Admission: 05/23/13 Date of Consultation: 05/23/13  Reason for Consultation:  Hematemesis  HPI:  Juan Quinn is an 80 year old male presenting to the ED via EMS from home secondary to acute onset of hematemeis. Hgb 9.5 on admission. Unknown baseline. No prior history of liver disease or prior EGD. No hx of PUD. Wife at bedside. Patient hemodynamically stable now; hypotensive on admission but now improved after NS boluses. Husband woke up this morning feeling clammy, nauseated. Went to bathroom and had large amount of bright red emesis. No abdominal pain, GERD, dysphagia, loss of appetite, or any other precipitating issues. Had episode of dark black stool on stretcher before leaving house with EMS. Takes baby aspirin daily. 2-3 Naprosyn tablets last Thursday. Took Ibuprofen sporadically while in [place] a few weeks ago. Feels improved since admission but still weak. No Goodys or BC powders. No PPI.    Past Medical History  Diagnosis Date  . HTN (hypertension)   . Hypercholesteremia   . Squamous cell carcinoma   . Adenomatous colon polyp 2000    Last colonoscopy [facility] 03/24/03 Dr. Rachelle Hora normal except 1 benign polyp. states 3-4 polyps removed 12  yrs ago    Past Surgical History  Procedure Laterality Date  . Shoulder    . Hemorrhoid surgery    . Colonoscopy  June 2012    Dr. Gala Romney: adenomatous polyps, due for surveillance if health permits in 1093    Prior to Admission medications   Medication Sig Start Date End Date Taking? Authorizing Provider  allopurinol (ZYLOPRIM) 100 MG tablet Take 100 mg by mouth daily.     Yes Historical Provider, MD  aspirin 81 MG tablet Take 81 mg by mouth daily.    Yes Historical Provider, MD  atenolol (TENORMIN) 25 MG tablet Take 25 mg by mouth daily.     Yes Historical Provider, MD  Cholecalciferol (VITAMIN D3) 1000 UNITS  /APS Take 1 capsule by mouth daily.     Yes Historical Provider, MD  Cyanocobalamin (B-12 PO) Take 500 mcg by mouth daily.     Yes Historical Provider, MD  fish oil-omega-3 fatty acids 1000 MG capsule Take 1 g by mouth daily.     Yes Historical Provider, MD  HYDROcodone-acetaminophen (NORCO/VICODIN) 5-325 MG per tablet Take 1 tablet by mouth daily. 05/18/13  Yes Historical Provider, MD  ipratropium (ATROVENT) 0.06 % nasal spray Place 2-3 sprays into both nostrils daily.  04/29/13  Yes Historical Provider, MD  lisinopril (PRINIVIL,ZESTRIL) 10 MG tablet Take 10 mg by mouth daily.     Yes Historical Provider, MD  naproxen (NAPROSYN) 500 MG tablet Take 1-2 tablets by mouth daily as needed for mild pain.  05/18/13  Yes Historical Provider, MD  simvastatin (ZOCOR) 40 MG tablet Take 20 mg by mouth every morning.    Yes Historical Provider, MD  tamsulosin (FLOMAX) 0.4 MG CAPS capsule Take 1 capsule by mouth daily. 04/08/13  Yes Historical Provider, MD  terazosin (HYTRIN) 5 MG capsule Take 5 mg by mouth once.   Yes Historical Provider, MD    No current facility-administered medications for this encounter.   Current Outpatient Prescriptions  Medication Sig Dispense Refill  . allopurinol (ZYLOPRIM) 100 MG tablet Take 100 mg by mouth daily.        Marland Kitchen aspirin 81 MG tablet Take 81 mg by mouth daily.       Marland Kitchen  atenolol (TENORMIN) 25 MG tablet Take 25 mg by mouth daily.        . Cholecalciferol (VITAMIN D3) 1000 UNITS CAPS Take 1 capsule by mouth daily.        . Cyanocobalamin (B-12 PO) Take 500 mcg by mouth daily.        . fish oil-omega-3 fatty acids 1000 MG capsule Take 1 g by mouth daily.        Marland Kitchen HYDROcodone-acetaminophen (NORCO/VICODIN) 5-325 MG per tablet Take 1 tablet by mouth daily.      Marland Kitchen ipratropium (ATROVENT) 0.06 % nasal spray Place 2-3 sprays into both nostrils daily.       Marland Kitchen lisinopril (PRINIVIL,ZESTRIL) 10 MG tablet Take 10 mg by mouth daily.        . naproxen (NAPROSYN) 500 MG tablet Take 1-2  tablets by mouth daily as needed for mild pain.       . simvastatin (ZOCOR) 40 MG tablet Take 20 mg by mouth every morning.       . tamsulosin (FLOMAX) 0.4 MG CAPS capsule Take 1 capsule by mouth daily.      Marland Kitchen terazosin (HYTRIN) 5 MG capsule Take 5 mg by mouth once.        Allergies as of 05/23/2013  . (No Known Allergies)    Family History  Problem Relation Age of Onset  . Pancreatic cancer Sister   . Leukemia Brother   . Lung cancer Sister   . Stroke Mother   . Stroke Father   . Colon cancer Neg Hx     History   Social History  . Marital Status: Married    Spouse Name: N/A    Number of Children: 4  . Years of Education: N/A   Occupational History  . retired     Press photographer   Social History Main Topics  . Smoking status: Former Smoker -- 2.00 packs/day for 50 years    Types: Cigarettes    Quit date: 07/28/2000  . Smokeless tobacco: Former Systems developer    Quit date: 02/25/1999  . Alcohol Use: Yes     Comment: couple glasses wine/week, occ beer or rmixed drink  . Drug Use: No  . Sexual Activity: Not on file   Other Topics Concern  . Not on file   Social History Narrative   Lives w/ wife          Review of Systems: As mentioned in HPI  Physical Exam: Vital signs in last 24 hours: Temp:  [97.5 F (36.4 C)] 97.5 F (36.4 C) (03/30 0823) Pulse Rate:  [79-98] 79 (03/30 1030) Resp:  [17-20] 19 (03/30 1030) BP: (93-102)/(43-58) 102/48 mmHg (03/30 1030) SpO2:  [98 %-100 %] 100 % (03/30 1030) Weight:  [210 lb (95.255 kg)] 210 lb (95.255 kg) (03/30 6010)   General:   Alert,  Well-developed, well-nourished, pleasant and cooperative in NAD Head:  Normocephalic and atraumatic. Eyes:  Sclera clear, no icterus.   Conjunctiva pink. Ears:  Normal auditory acuity. Nose:  No deformity, discharge,  or lesions. Mouth:  No deformity or lesions, edentulous Neck:  Supple; no masses or thyromegaly. Lungs:  Clear throughout to auscultation.   No wheezes, crackles, or rhonchi. No acute  distress. Heart:  S1 S2 present; no murmurs, clicks, rubs,  or gallops. Abdomen:  Soft, nontender and nondistended. No masses, hepatosplenomegaly or hernias noted. Normal bowel sounds, without guarding, and without rebound.   Rectal:  Deferred  Msk:  Symmetrical without gross deformities. Normal posture. Extremities:  Without clubbing or edema. Neurologic:  Alert and  oriented x4;  grossly normal neurologically. Skin:  Intact without significant lesions or rashes. Cervical Nodes:  No significant cervical adenopathy. Psych:  Alert and cooperative. Normal mood and affect.  Intake/Output from previous day:   Intake/Output this shift:    Lab Results:  Recent Labs  05/23/13 0830  WBC 8.6  HGB 9.5*  HCT 27.4*  PLT 119*   BMET  Recent Labs  05/23/13 0830  NA 144  K 5.0  CL 109  CO2 26  GLUCOSE 179*  BUN 57*  CREATININE 1.18  CALCIUM 8.7   LFT  Recent Labs  05/23/13 0830  PROT 4.8*  ALBUMIN 2.6*  AST 12  ALT 7  ALKPHOS 35*  BILITOT 0.3   PT/INR  Recent Labs  05/23/13 0830  LABPROT 14.7  INR 1.17   Impression: 80 year old male who appears much younger than stated age, presenting with acute episodes of large volume painless hematemesis and 1 episode of melena in the setting of sporadic NSAID use over the past few months. No prior known PUD or EGD, and he denies any known liver disease. Unknown baseline Hgb but admitting Hgb 9.5; hemodynamically improved since admission with fluid resuscitation and supportive measures. No further hematemesis since presentation to ED.   Discussed with both husband and wife the need for upper endoscopy for further evaluation. Both are in agreement and understand the risks and benefits. Need to keep 2 units PRBCs on hold, remain NPO, start Protonix IV BID. EGD later today with Dr. Gala Romney. Serial H/H.  Plan: NPO PPI IV EGD with Dr. Gala Romney this afternoon Supportive measures Keep 2 units PRBCs on hold Serial H/H   Orvil Feil,  ANP-BC [facility]     LOS: 0 days    05/23/2013, 11:05 AM  Attending note:  Patient seen and examined in short stay. NSAID gastropathy/peptic ulcer disease likely. Agree with need for EGD. The risks, benefits, limitations, alternatives and imponderables have been reviewed with the patient. Potential for , biopsy, etc. have also been reviewed.  Questions have been answered. All parties agreeable.

## 2013-05-23 NOTE — Progress Notes (Signed)
Blood transfusion  Completed at 1430.  Pt with no complications

## 2013-05-23 NOTE — ED Provider Notes (Signed)
CSN: [id number]     Arrival date & time    History  This chart was scribed for Juan Santilli B. Karle Starch, MD by Roe Coombs, ED Scribe. The patient was seen in room APA02/APA02. Patient's care was started at 8:13 AM.    Chief Complaint  Patient presents with  . GI Bleeding     The history is provided by the patient. No language interpreter was used.    HPI Comments: Juan Quinn is a 80 y.o. male who presents to the Emergency Department via EMS from home complaining of sudden onset hematemesis that began less than 1 hour ago while at home. He states that he saw bright red blood in the emesis contents. Patient reports associated nausea prior to the episodes of emesis which continues now. He did not try any treatments at home. Per nursing staff, patient had bright red blood around his mouth upon arrival in the ED. He denies any recent illnesses. He has no prior history of GI bleeds. He denies abdominal pain or any other symptoms at this time. He has a medical history of HTN, hypercholesteremia, squamous cell carcinoma and colon polyp. He denies any alcohol use.  PCP - Cindie Laroche   Past Medical History  Diagnosis Date  . HTN (hypertension)   . Hypercholesteremia   . Squamous cell carcinoma   . Adenomatous colon polyp 2000    Last colonoscopy [facility] 03/24/03 Dr. Rachelle Hora normal except 1 benign polyp. states 3-4 polyps removed 12  yrs ago   Past Surgical History  Procedure Laterality Date  . Shoulder    . Hemorrhoid surgery     Family History  Problem Relation Age of Onset  . Pancreatic cancer Sister   . Leukemia Brother   . Lung cancer Sister   . Stroke Mother   . Stroke Father    History  Substance Use Topics  . Smoking status: Former Smoker -- 2.00 packs/day for 50 years    Types: Cigarettes    Quit date: 07/28/2000  . Smokeless tobacco: Former Systems developer    Quit date: 02/25/1999  . Alcohol Use: Yes     Comment: couple glasses wine/week, occ beer o rmixed drink    Review of  Systems   A complete 10 system review of systems was obtained and all systems are negative except as noted in the HPI and PMH.     Allergies  Review of patient's allergies indicates no known allergies.  Home Medications   Current Outpatient Rx  Name  Route  Sig  Dispense  Refill  . allopurinol (ZYLOPRIM) 100 MG tablet   Oral   Take 100 mg by mouth daily.           Marland Kitchen aspirin 81 MG tablet   Oral   Take 81 mg by mouth every other day.          Marland Kitchen atenolol (TENORMIN) 25 MG tablet   Oral   Take 25 mg by mouth daily.           . Cholecalciferol (VITAMIN D3) 1000 UNITS CAPS   Oral   Take 1 capsule by mouth daily.           . Cyanocobalamin (B-12 PO)   Oral   Take 500 mcg by mouth daily.           Marland Kitchen docusate sodium (COLACE) 100 MG capsule   Oral   Take 100 mg by mouth 2 (two) times daily.           Marland Kitchen  fish oil-omega-3 fatty acids 1000 MG capsule   Oral   Take 1 g by mouth daily.           Marland Kitchen lisinopril (PRINIVIL,ZESTRIL) 10 MG tablet   Oral   Take 10 mg by mouth daily.           . simvastatin (ZOCOR) 40 MG tablet   Oral   Take 20 mg by mouth every morning.           Triage Vitals: BP 97/58  Pulse 86  Resp 17  Ht 5\' 9"  (1.753 m)  Wt 210 lb (95.255 kg)  BMI 31.00 kg/m2  SpO2 98% Physical Exam  Nursing note and vitals reviewed. Constitutional: He is oriented to person, place, and time. He appears well-developed and well-nourished.  HENT:  Head: Normocephalic and atraumatic.  Eyes: EOM are normal. Pupils are equal, round, and reactive to light.  Neck: Normal range of motion. Neck supple.  Cardiovascular: Normal rate, normal heart sounds and intact distal pulses.   Pulmonary/Chest: Effort normal and breath sounds normal.  Abdominal: Bowel sounds are normal. He exhibits no distension. There is no tenderness.  Musculoskeletal: Normal range of motion. He exhibits no edema and no tenderness.  Neurological: He is alert and oriented to person, place,  and time. He has normal strength. No cranial nerve deficit or sensory deficit.  Skin: Skin is warm and dry. No rash noted.  Psychiatric: He has a normal mood and affect.    ED Course  Procedures (including critical care time) DIAGNOSTIC STUDIES: Oxygen Saturation is 98% on room air, normal by my interpretation.    COORDINATION OF CARE: 8:16 AM- Will order IV fluids, Zofran, Protonix and labs. Patient informed of current plan for treatment and evaluation and agrees with plan at this time.   9:51 AM - Discussed lab results with patient. Hgb is low at 9.5 and patient is also hypotensive. Recommended admission and patient is in agreement with this. Will order consult to hospitalist.  10:37 AM - Spoke with Dr. Domenic Polite, hospitalist on call, who agrees to admit patient.   Labs Review Labs Reviewed  CBC WITH DIFFERENTIAL - Abnormal; Notable for the following:    RBC 2.63 (*)    Hemoglobin 9.5 (*)    HCT 27.4 (*)    MCV 104.2 (*)    MCH 36.1 (*)    Platelets 119 (*)    All other components within normal limits  COMPREHENSIVE METABOLIC PANEL - Abnormal; Notable for the following:    Glucose, Bld 179 (*)    BUN 57 (*)    Total Protein 4.8 (*)    Albumin 2.6 (*)    Alkaline Phosphatase 35 (*)    GFR calc non Af Amer 56 (*)    GFR calc Af Amer 65 (*)    All other components within normal limits  LACTIC ACID, PLASMA - Abnormal; Notable for the following:    Lactic Acid, Venous 4.2 (*)    All other components within normal limits  CBC - Abnormal; Notable for the following:    RBC 2.53 (*)    Hemoglobin 9.1 (*)    HCT 26.2 (*)    MCV 103.6 (*)    MCH 36.0 (*)    Platelets 116 (*)    All other components within normal limits  POC OCCULT BLOOD, ED - Abnormal; Notable for the following:    Fecal Occult Bld POSITIVE (*)    All other components within normal limits  MRSA PCR SCREENING  LIPASE, BLOOD  PROTIME-INR  APTT  CBC  TYPE AND SCREEN  ABO/RH  PREPARE RBC (CROSSMATCH)     Imaging Review No results found.   EKG Interpretation None      MDM   Final diagnoses:  Upper GI bleed    Upper GI bleeding has stopped for now. Mild hypotension but mentating well. Anemia not yet in need of transfusion. Admit for further eval. Dr. Gala Romney consulted for GI.   I personally performed the services described in this documentation, which was scribed in my presence. The recorded information has been reviewed and is accurate.      Alainna Stawicki B. Karle Starch, MD 05/23/13 [phone]

## 2013-05-23 NOTE — Op Note (Signed)
[facility] [address] [place], [address]   ENDOSCOPY PROCEDURE REPORT  PATIENT: Juan Quinn, Juan Quinn  MR#: [id number] BIRTHDATE: [birth year] , 67  yrs. old GENDER: Male ENDOSCOPIST: R.  Garfield Cornea, MD FACP [facility] REFERRED BY:     Hospitalist PROCEDURE DATE:  05/23/2013 PROCEDURE:     diagnostic EGD  INDICATIONS:      Hematemesis;  INFORMED CONSENT:   The risks, benefits, limitations, alternatives and imponderables have been discussed.  The potential for biopsy, esophogeal dilation, etc. have also been reviewed.  Questions have been answered.  All parties agreeable.  Please see the history and physical in the medical record for more information.  MEDICATIONS:     Demerol 100 mg IV and Versed 11 mg IV. Zofran 4 mg IV Reglan 10 mg IV viscous Xylocaine gel  DESCRIPTION OF PROCEDURE:   The VA-[id number] ([id number]) and [id number] ([id number])  endoscope was introduced through the mouth and advanced to the second portion of the duodenum without difficulty or limitations.  The mucosal surfaces were surveyed very carefully during advancement of the scope and upon withdrawal.  Retroflexion view of the proximal stomach and esophagogastric junction was performed.      FINDINGS:   Grossly normal esophagus. Proximal stomach full of old blood/clot food debris. Antrum seen fairly well and mucosa appeared normal pylorus patent and easily traversed examination bulb and second portion revealed no gross abnormalities. Attention turned to the proximal stomach. I spent a long period of time with the diagnostic and therapeutic scope alongs with the Biovac trying to get the clot and food debris out the proximal stomach. Using gravity, 2 scopes, the bio back. Eventually got most of this debris out of the stomach. During this lengthy procedure, I noted there was no active bleeding seen. And I did not identify a mucosal lesion which would explain bleeding.   However, visualization  was limited.  THERAPEUTIC / DIAGNOSTIC MANEUVERS PERFORMED:  none   COMPLICATIONS:  None  IMPRESSION:  Incomplete EGD secondary to much clot and food debris in the stomach  RECOMMENDATIONS:  IV PPI infusion. Sips of clear liquids. IV Reglan. Repeat EGD tomorrow.  I have discussed plans with family members.    _______________________________ R. Garfield Cornea, MD FACP [facility] eSigned:  R. Garfield Cornea, MD FACP [facility] 05/23/2013 4:46 PM     CC:  PATIENT NAME:  Juan Quinn, Juan Quinn MR#: [id number]

## 2013-05-23 NOTE — H&P (Signed)
[facility] History and Physical  Juan Quinn JSR:[id number] DOB: 11/04/1930 DOA: 05/23/2013  Referring physician: EDP PCP: [name], MD   Chief Complaint: vomiting blood  HPI: Juan Quinn is a 80 y.o. male with PMH of HTN, dyslipidemia presents to the ER today with above complaints. He woke up nauseous and this was followed by 3 episodes of vomiting, bright red blood and called EMS and came to the ER. Denies ETOH use. Used Few ibuprofen last week and some Naproxen this week for a pulled back muscle. In ER, hb 9.5, BP in 90s on admission, TR[facility] consulted for further management   Review of Systems:  Constitutional:  No weight loss, night sweats, Fevers, chills, fatigue.  HEENT:  No headaches, Difficulty swallowing,Tooth/dental problems,Sore throat,  No sneezing, itching, ear ache, nasal congestion, post nasal drip,  Cardio-vascular:  No chest pain, Orthopnea, PND, swelling in lower extremities, anasarca, dizziness, palpitations  GI:  No heartburn, indigestion, abdominal pain, nausea, vomiting, diarrhea, change in bowel habits, loss of appetite  Resp:  No shortness of breath with exertion or at rest. No excess mucus, no productive cough, No non-productive cough, No coughing up of blood.No change in color of mucus.No wheezing.No chest wall deformity  Skin:  no rash or lesions.  GU:  no dysuria, change in color of urine, no urgency or frequency. No flank pain.  Musculoskeletal:  No joint pain or swelling. No decreased range of motion. No back pain.  Psych:  No change in mood or affect. No depression or anxiety. No memory loss.   Past Medical History  Diagnosis Date  . HTN (hypertension)   . Hypercholesteremia   . Squamous cell carcinoma   . Adenomatous colon polyp 2000    Last colonoscopy [facility] 03/24/03 Dr. Rachelle Hora normal except 1 benign polyp. states 3-4 polyps removed 12  yrs ago   Past Surgical History  Procedure Laterality Date  . Shoulder    .  Hemorrhoid surgery    . Colonoscopy  June 2012    Dr. Gala Romney: adenomatous polyps, due for surveillance if health permits in 9244   Social History:  reports that he quit smoking about 12 years ago. His smoking use included Cigarettes. He has a 100 pack-year smoking history. He quit smokeless tobacco use about 14 years ago. He reports that he drinks alcohol. He reports that he does not use illicit drugs.  No Known Allergies  Family History  Problem Relation Age of Onset  . Pancreatic cancer Sister   . Leukemia Brother   . Lung cancer Sister   . Stroke Mother   . Stroke Father   . Colon cancer Neg Hx      Prior to Admission medications   Medication Sig Start Date End Date Taking? Authorizing Provider  allopurinol (ZYLOPRIM) 100 MG tablet Take 100 mg by mouth daily.     Yes Historical Provider, MD  aspirin 81 MG tablet Take 81 mg by mouth daily.    Yes Historical Provider, MD  atenolol (TENORMIN) 25 MG tablet Take 25 mg by mouth daily.     Yes Historical Provider, MD  Cholecalciferol (VITAMIN D3) 1000 UNITS CAPS Take 1 capsule by mouth daily.     Yes Historical Provider, MD  Cyanocobalamin (B-12 PO) Take 500 mcg by mouth daily.     Yes Historical Provider, MD  fish oil-omega-3 fatty acids 1000 MG capsule Take 1 g by mouth daily.     Yes Historical Provider, MD  HYDROcodone-acetaminophen (NORCO/VICODIN) 5-325 MG per  tablet Take 1 tablet by mouth daily. 05/18/13  Yes Historical Provider, MD  ipratropium (ATROVENT) 0.06 % nasal spray Place 2-3 sprays into both nostrils daily.  04/29/13  Yes Historical Provider, MD  lisinopril (PRINIVIL,ZESTRIL) 10 MG tablet Take 10 mg by mouth daily.     Yes Historical Provider, MD  naproxen (NAPROSYN) 500 MG tablet Take 1-2 tablets by mouth daily as needed for mild pain.  05/18/13  Yes Historical Provider, MD  simvastatin (ZOCOR) 40 MG tablet Take 20 mg by mouth every morning.    Yes Historical Provider, MD  tamsulosin (FLOMAX) 0.4 MG CAPS capsule Take 1 capsule  by mouth daily. 04/08/13  Yes Historical Provider, MD  terazosin (HYTRIN) 5 MG capsule Take 5 mg by mouth once.   Yes Historical Provider, MD   Physical Exam: Filed Vitals:   05/23/13 1030  BP: 102/48  Pulse: 79  Temp:   Resp: 19    BP 102/48  Pulse 79  Temp(Src) 97.5 F (36.4 C) (Rectal)  Resp 19  Ht 5\' 9"  (1.753 m)  Wt 95.255 kg (210 lb)  BMI 31.00 kg/m2  SpO2 100%  General:  Appears comfortable, anxious, no distress Eyes: PERRL, normal lids, irises & conjunctiva ENT: grossly normal hearing, lips & tongue Neck: no LAD, masses or thyromegaly Cardiovascular: RRR, no m/r/g. No LE edema. Respiratory: CTA bilaterally, no w/r/r. Normal respiratory effort. Abdomen: soft, ntnd Skin: no rash or induration seen on limited exam Musculoskeletal: grossly normal tone BUE/BLE Psychiatric: grossly normal mood and affect, speech fluent and appropriate Neurologic: grossly non-focal.          Labs on Admission:  Basic Metabolic Panel:  Recent Labs Lab 05/23/13 0830  NA 144  K 5.0  CL 109  CO2 26  GLUCOSE 179*  BUN 57*  CREATININE 1.18  CALCIUM 8.7   Liver Function Tests:  Recent Labs Lab 05/23/13 0830  AST 12  ALT 7  ALKPHOS 35*  BILITOT 0.3  PROT 4.8*  ALBUMIN 2.6*    Recent Labs Lab 05/23/13 0830  LIPASE 32   No results found for this basename: AMMONIA,  in the last 168 hours CBC:  Recent Labs Lab 05/23/13 0830  WBC 8.6  NEUTROABS 5.9  HGB 9.5*  HCT 27.4*  MCV 104.2*  PLT 119*   Cardiac Enzymes: No results found for this basename: CKTOTAL, CKMB, CKMBINDEX, TROPONINI,  in the last 168 hours  BNP (last 3 results) No results found for this basename: PROBNP,  in the last 8760 hours CBG: No results found for this basename: GLUCAP,  in the last 168 hours  Radiological Exams on Admission: No results found.   Assessment/Plan    Upper GI bleed -suspect gastritis vs PUD from NSAID use -IV PPI -Hold ASA -EGD per Gi today  Acute blood loss  anemia -due to 1, monitor CBC Q12 -transfuse 1 unit PRBC now    HTN (hypertension) -BP soft, resume atenolol/ACE as tolerated    Hyperlipidemia -resume statin      BPH -continue Alpha-blocker  DVT proph: SCds  Code Status: DNR Family Communication:d/w wife at bedside Disposition Plan: admit to SDU  Time spent: 21min  Juan Quinn [facility] Pager [phone]

## 2013-05-24 ENCOUNTER — Encounter (HOSPITAL_COMMUNITY)
Admission: EM | Disposition: A | Discharge status: Home / Self Care | Payer: Self-pay | Source: Home / Self Care | Attending: Family Medicine

## 2013-05-24 ENCOUNTER — Encounter (HOSPITAL_COMMUNITY): Payer: Self-pay | Admitting: *Deleted

## 2013-05-24 DIAGNOSIS — K279 Peptic ulcer, site unspecified, unspecified as acute or chronic, without hemorrhage or perforation: Secondary | ICD-10-CM

## 2013-05-24 HISTORY — PX: ESOPHAGOGASTRODUODENOSCOPY: SHX5428

## 2013-05-24 LAB — CBC
HCT: 25.2 % — ABNORMAL LOW (ref 39.0–52.0)
HCT: 24.4 % — ABNORMAL LOW (ref 39.0–52.0)
Hemoglobin: 8.9 g/dL — ABNORMAL LOW (ref 13.0–17.0)
Hemoglobin: 8.2 g/dL — ABNORMAL LOW (ref 13.0–17.0)
MCH: 34.9 pg — ABNORMAL HIGH (ref 26.0–34.0)
MCH: 33.1 pg (ref 26.0–34.0)
MCHC: 35.3 g/dL (ref 30.0–36.0)
MCHC: 33.6 g/dL (ref 30.0–36.0)
MCV: 98.8 fL (ref 78.0–100.0)
MCV: 98.4 fL (ref 78.0–100.0)
Platelets: 111 10*3/uL — ABNORMAL LOW (ref 150–400)
RBC: 2.55 MIL/uL — ABNORMAL LOW (ref 4.22–5.81)
RBC: 2.48 MIL/uL — ABNORMAL LOW (ref 4.22–5.81)
RDW: 16.9 % — ABNORMAL HIGH (ref 11.5–15.5)
RDW: 16.4 % — AB (ref 11.5–15.5)
WBC: 6.7 10*3/uL (ref 4.0–10.5)
WBC: 6.1 10*3/uL (ref 4.0–10.5)

## 2013-05-24 LAB — BASIC METABOLIC PANEL
BUN: 44 mg/dL — ABNORMAL HIGH (ref 6–23)
CO2: 23 mEq/L (ref 19–32)
Calcium: 8.1 mg/dL — ABNORMAL LOW (ref 8.4–10.5)
Chloride: 112 mEq/L (ref 96–112)
Creatinine, Ser: 0.9 mg/dL (ref 0.50–1.35)
GFR calc Af Amer: >90 mL/min (ref >90)
GFR, EST NON AFRICAN AMERICAN: 78 mL/min — AB (ref >90)
Glucose, Bld: 94 mg/dL (ref 70–99)
Potassium: 3.9 mEq/L (ref 3.7–5.3)
Sodium: 144 mEq/L (ref 137–147)

## 2013-05-24 SURGERY — EGD (ESOPHAGOGASTRODUODENOSCOPY)
Anesthesia: Moderate Sedation

## 2013-05-24 MED ORDER — MIDAZOLAM HCL 5 MG/5ML IJ SOLN
INTRAMUSCULAR | Status: DC | PRN
  Administered 2013-05-24: 2 mg via INTRAVENOUS

## 2013-05-24 MED ORDER — LISINOPRIL 10 MG PO TABS
10.0000 mg | ORAL_TABLET | Freq: Every day | ORAL | Status: DC
  Administered 2013-05-26 – 2013-05-27 (×2): 10 mg via ORAL
  Filled 2013-05-24 (×4): qty 1

## 2013-05-24 MED ORDER — LIDOCAINE VISCOUS 2 % MT SOLN
OROMUCOSAL | Status: DC | PRN
  Administered 2013-05-24: 4 mL via OROMUCOSAL

## 2013-05-24 MED ORDER — MEPERIDINE HCL 100 MG/ML IJ SOLN
INTRAMUSCULAR | Status: DC | PRN
  Administered 2013-05-24: 25 mg via INTRAVENOUS

## 2013-05-24 MED ORDER — MEPERIDINE HCL 100 MG/ML IJ SOLN
INTRAMUSCULAR | Status: AC
  Filled 2013-05-24: qty 2

## 2013-05-24 MED ORDER — ONDANSETRON HCL 4 MG/2ML IJ SOLN
INTRAMUSCULAR | Status: DC | PRN
  Administered 2013-05-24: 4 mg via INTRAVENOUS

## 2013-05-24 MED ORDER — ONDANSETRON HCL 4 MG/2ML IJ SOLN
INTRAMUSCULAR | Status: AC
  Filled 2013-05-24: qty 2

## 2013-05-24 MED ORDER — STERILE WATER FOR IRRIGATION IR SOLN
Status: DC | PRN
  Administered 2013-05-24: 13:00:00

## 2013-05-24 MED ORDER — ATENOLOL 25 MG PO TABS
25.0000 mg | ORAL_TABLET | Freq: Every day | ORAL | Status: DC
  Administered 2013-05-24 – 2013-05-27 (×4): 25 mg via ORAL
  Filled 2013-05-24 (×4): qty 1

## 2013-05-24 MED ORDER — SUCRALFATE 1 GM/10ML PO SUSP
1.0000 g | Freq: Three times a day (TID) | ORAL | Status: DC
  Administered 2013-05-24 – 2013-05-27 (×11): 1 g via ORAL
  Filled 2013-05-24 (×11): qty 10

## 2013-05-24 MED ORDER — LIDOCAINE VISCOUS 2 % MT SOLN
OROMUCOSAL | Status: AC
  Filled 2013-05-24: qty 15

## 2013-05-24 MED ORDER — MIDAZOLAM HCL 5 MG/5ML IJ SOLN
INTRAMUSCULAR | Status: AC
  Filled 2013-05-24: qty 10

## 2013-05-24 NOTE — Progress Notes (Signed)
Patient sitting up in chair, no complaints currently. Wife at bedside. Aware of repeat EGD needed for today. NPO.

## 2013-05-24 NOTE — Op Note (Signed)
[facility] [address] [place], [address]   ENDOSCOPY PROCEDURE REPORT  PATIENT: Juan Quinn, Juan Quinn  MR#: [id number] BIRTHDATE: 05/08/1930 , 72  yrs. old GENDER: Male ENDOSCOPIST: R.  Garfield Cornea, MD FACP [facility] REFERRED BY:  Conni Slipper, M.D. PROCEDURE DATE:  05/24/2013 PROCEDURE:     Diagnostic EGD  INDICATIONS:     Hemodynamically significant upper GI bleed; incomplete EGD yesterday  INFORMED CONSENT:   The risks, benefits, limitations, alternatives and imponderables have been discussed.  The potential for biopsy, esophogeal dilation, etc. have also been reviewed.  Questions have been answered.  All parties agreeable.  Please see the history and physical in the medical record for more information.  MEDICATIONS:   Versed 2 mg IV and Demerol 25 mg IV ; Zofran 4 mg IV. Xylocaine gel orally  DESCRIPTION OF PROCEDURE:   The [id number] ([id number])  endoscope was introduced through the mouth and advanced to the second portion of the duodenum without difficulty or limitations.  The mucosal surfaces were surveyed very carefully during advancement of the scope and upon withdrawal.  Retroflexion view of the proximal stomach and esophagogastric junction was performed.      FINDINGS: Normal esophagus. Stomach empty today. Patient has a small hiatal hernia. Some proximal gastric excoriations. In the antrum, the patient had 3 stellate shaped prepyloric gastric ulcers. The largest and deepest was approximately 1 cm in dimension. It had a clean base. No bleeding stigmata seen today. No infiltrating process seen. Patent pylorus. Normal first and second portion of the duodenum.  THERAPEUTIC / DIAGNOSTIC MANEUVERS PERFORMED:  None   COMPLICATIONS:  None  IMPRESSION:   Prepyloric gastric ulcerations-likely source of hematemesis-likely NSAID-related. We need to rule out H. pylori.  RECOMMENDATIONS:   Absolutely refrain from taking any form of nonsteroidal agents for the  next 12 weeks (this includes even a baby aspirin daily).  Twice a day PPI therapy. Add Carafate for 10 days. Check H. pylori serologies. Office followup in 12 weeks to set up a repeat EGD to document ulcer healing. My findings and recommendations have been discussed with the patient's wife at the bedside in the endoscopy unit today. Clear liquids the remainder of today;  plan to advance diet tomorrow morning and switch his acid suppression therapy to the oral route.    _______________________________ R. Garfield Cornea, MD FACP [facility] eSigned:  R. Garfield Cornea, MD FACP [facility] 05/24/2013 1:47 PM     CC:  PATIENT NAME:  Dorion, Petillo MR#: [id number]

## 2013-05-24 NOTE — Progress Notes (Signed)
UR chart review completed.  

## 2013-05-24 NOTE — Progress Notes (Signed)
Subjective: Patient was admitted yesterday due to GI bleeding, He had EGD yesterday but the stomach of full of blood, He is planned for repeat EGD today.  Objective: Vital signs in last 24 hours: Temp:  [97.5 F (36.4 C)-98.7 F (37.1 C)] 98.4 F (36.9 C) (03/31 0400) Pulse Rate:  [69-105] 69 (03/31 0600) Resp:  [13-24] 16 (03/31 0600) BP: (75-137)/(33-118) 89/52 mmHg (03/31 0600) SpO2:  [90 %-100 %] 91 % (03/31 0600) Weight:  [97.1 kg (214 lb 1.1 oz)] 97.1 kg (214 lb 1.1 oz) (03/31 0500) Weight change:  Last BM Date: 05/23/13  Intake/Output from previous day: 03/30 0701 - 03/31 0700 In: 2451.3 [I.V.:2451.3] Out: 1250 [Urine:1250]  PHYSICAL EXAM General appearance: alert and no distress Resp: clear to auscultation bilaterally Cardio: S1, S2 normal GI: soft, non-tender; bowel sounds normal; no masses,  no organomegaly Extremities: extremities normal, atraumatic, no cyanosis or edema  Lab Results:    @labtest @ ABGS No results found for this basename: PHART, PCO2, PO2ART, TCO2, HCO3,  in the last 72 hours CULTURES Recent Results (from the past 240 hour(s))  MRSA PCR SCREENING     Status: None   Collection Time    05/23/13 12:00 PM      Result Value Ref Range Status   MRSA by PCR NEGATIVE  NEGATIVE Final   Comment:            The GeneXpert MRSA Assay (FDA     approved for NASAL specimens     only), is one component of a     comprehensive MRSA colonization     surveillance program. It is not     intended to diagnose MRSA     infection nor to guide or     monitor treatment for     MRSA infections.   Studies/Results: No results found.  Medications: I have reviewed the patient's current medications.  Assesment: Principal Problem:   Upper GI bleed Active Problems:   HTN (hypertension)   Hyperlipidemia   Anemia due to acute blood loss   Hematemesis    Plan: Medications reviewed Will continue to monitor CBC As per GI plan.    LOS: 1 day    Armondo Cech 05/24/2013, 8:21 AM

## 2013-05-24 NOTE — Progress Notes (Signed)
Pt heart rate changed to sinus tachycardia rate 150's. Dr. Legrand Rams paged to make aware. Home medications Atenolol and Lisinopril re-ordered. Dr. Gala Romney Made aware as well since pt is scheduled for EGD this evening. Will cont to monitor.

## 2013-05-25 ENCOUNTER — Encounter (HOSPITAL_COMMUNITY): Payer: Self-pay | Admitting: Internal Medicine

## 2013-05-25 DIAGNOSIS — D62 Acute posthemorrhagic anemia: Secondary | ICD-10-CM

## 2013-05-25 LAB — CBC
HCT: 21.3 % — ABNORMAL LOW (ref 39.0–52.0)
HEMATOCRIT: 25.7 % — AB (ref 39.0–52.0)
Hemoglobin: 8.8 g/dL — ABNORMAL LOW (ref 13.0–17.0)
Hemoglobin: 7.5 g/dL — ABNORMAL LOW (ref 13.0–17.0)
MCH: 34.4 pg — AB (ref 26.0–34.0)
MCH: 34.7 pg — AB (ref 26.0–34.0)
MCHC: 34.2 g/dL (ref 30.0–36.0)
MCHC: 35.2 g/dL (ref 30.0–36.0)
MCV: 100.4 fL — AB (ref 78.0–100.0)
MCV: 98.6 fL (ref 78.0–100.0)
Platelets: 114 10*3/uL — ABNORMAL LOW (ref 150–400)
Platelets: 101 10*3/uL — ABNORMAL LOW (ref 150–400)
RBC: 2.56 MIL/uL — ABNORMAL LOW (ref 4.22–5.81)
RBC: 2.16 MIL/uL — ABNORMAL LOW (ref 4.22–5.81)
RDW: 16 % — AB (ref 11.5–15.5)
RDW: 15.7 % — AB (ref 11.5–15.5)
WBC: 5.7 10*3/uL (ref 4.0–10.5)
WBC: 4.9 10*3/uL (ref 4.0–10.5)

## 2013-05-25 LAB — HEMOGLOBIN AND HEMATOCRIT, BLOOD
HCT: 25.8 % — ABNORMAL LOW (ref 39.0–52.0)
Hemoglobin: 8.7 g/dL — ABNORMAL LOW (ref 13.0–17.0)

## 2013-05-25 MED ORDER — PANTOPRAZOLE SODIUM 40 MG PO TBEC
40.0000 mg | DELAYED_RELEASE_TABLET | Freq: Two times a day (BID) | ORAL | Status: DC
  Administered 2013-05-25 – 2013-05-27 (×4): 40 mg via ORAL
  Filled 2013-05-25 (×4): qty 1

## 2013-05-25 NOTE — Progress Notes (Signed)
Report called to RN for room 304  Family updated.

## 2013-05-25 NOTE — Progress Notes (Signed)
    Subjective: Denies abdominal pain, N/V. Wants to go home. No overt signs of bleeding. Tolerating liquid diet. Would like to advance diet.   Objective: Vital signs in last 24 hours: Temp:  [97.8 F (36.6 C)-99.2 F (37.3 C)] 98 F (36.7 C) (04/01 0755) Pulse Rate:  [42-157] 50 (04/01 0800) Resp:  [12-28] 13 (04/01 0800) BP: (79-132)/(33-68) 114/49 mmHg (04/01 0800) SpO2:  [88 %-100 %] 98 % (04/01 0800) Weight:  [214 lb (97.07 kg)-215 lb 9.8 oz (97.8 kg)] 215 lb 9.8 oz (97.8 kg) (04/01 0500) Last BM Date: 05/23/13 General:   Alert and oriented, pleasant Head:  Normocephalic and atraumatic. Abdomen:  Bowel sounds present, soft, non-tender, non-distended. No HSM or hernias noted. No rebound or guarding. No masses appreciated  Extremities:  Without clubbing or edema. Neurologic:  Alert and  oriented x4;  grossly normal neurologically.  Intake/Output from previous day: 03/31 0701 - 04/01 0700 In: 2400 [I.V.:2400] Out: 3900 [Urine:3900] Intake/Output this shift: Total I/O In: 200 [I.V.:200] Out: 475 [Urine:475]  Lab Results:  Recent Labs  05/23/13 2257 05/24/13 1137 05/24/13 2303  WBC 7.8 6.7 6.1  HGB 9.0* 8.9* 8.2*  HCT 25.8* 25.2* 24.4*  PLT 100* 111* CONSISTENT WITH PREVIOUS RESULT   BMET  Recent Labs  05/23/13 0830 05/24/13 0431  NA 144 144  K 5.0 3.9  CL 109 112  CO2 26 23  GLUCOSE 179* 94  BUN 57* 44*  CREATININE 1.18 0.90  CALCIUM 8.7 8.1*   LFT  Recent Labs  05/23/13 0830  PROT 4.8*  ALBUMIN 2.6*  AST 12  ALT 7  ALKPHOS 35*  BILITOT 0.3   PT/INR  Recent Labs  05/23/13 0830  LABPROT 14.7  INR 1.17   Assessment: 80 year old male with UGI bleed and acute blood loss anemia secondary to prepyloric gastric ulcerations in the setting of NSAID use. Needs H.pylori serology and treatment if positive. Clinically stable; will recheck H/H again this morning and advance diet. If he does well with this, recommend discharge this afternoon.     Plan: D/C Protonix drip Start Protonix 40 mg po BID Carafate for 10 days H.pylori serology Absolute avoidance of NSAIDs to include baby aspirin for 12 weeks  Repeat EGD in 12 weeks; our office to arrange Hopeful discharge this afternoon. I have discussed this with Mikki Santee, primary RN for patient.   Orvil Feil, ANP-BC [facility]    LOS: 2 days    05/25/2013, 9:09 AM   Attending note:  Patient seen and examined in the ICU. Agree with above assessment and plan. We'll follow up on H. pylori serologies. Discussed importance of avoidance of all NSAIDs with patient and family members today.

## 2013-05-25 NOTE — Care Management Note (Addendum)
    Page 1 of 1   05/27/2013     9:07:52 AM   CARE MANAGEMENT NOTE 05/27/2013  Patient:  MARSHAUN, LORTIE   Account Number:  [id number]  Date Initiated:  05/25/2013  Documentation initiated by:  Theophilus Kinds  Subjective/Objective Assessment:   Pt admitted from home with gi bleeding. Pt lives with his wife and will return home at discharge. Pt is independent with ADL's.     Action/Plan:   No CM needs noted.   Anticipated DC Date:  05/26/2013   Anticipated DC Plan:  [place]  CM consult      Choice offered to / List presented to:             Status of service:  Completed, signed off Medicare Important Message given?  YES (If response is "NO", the following Medicare IM given date fields will be blank) Date Medicare IM given:  05/27/2013 Date Additional Medicare IM given:    Discharge Disposition:  HOME/SELF CARE  Per UR Regulation:    If discussed at Long Length of Stay Meetings, dates discussed:    Comments:  05/27/13 [place], RN BSN CM Pt discharged home today. No CM needs noted.  05/25/13 [place], RN BSN CM

## 2013-05-25 NOTE — Progress Notes (Signed)
Subjective: Patient feels better today. No hematemesis,m nausea or vomiting. He had a repeat EDG which showed prepyloric gastric ulcerations.  Objective: Vital signs in last 24 hours: Temp:  [97.8 F (36.6 C)-99.2 F (37.3 C)] 98 F (36.7 C) (04/01 0755) Pulse Rate:  [42-157] 59 (04/01 0600) Resp:  [13-28] 16 (04/01 0600) BP: (79-132)/(33-68) 132/55 mmHg (04/01 0600) SpO2:  [88 %-100 %] 93 % (04/01 0600) Weight:  [97.07 kg (214 lb)-97.8 kg (215 lb 9.8 oz)] 97.8 kg (215 lb 9.8 oz) (04/01 0500) Weight change: 1.814 kg (4 lb) Last BM Date: 05/23/13  Intake/Output from previous day: 03/31 0701 - 04/01 0700 In: 2400 [I.V.:2400] Out: 3900 [Urine:3900]  PHYSICAL EXAM General appearance: alert and no distress Resp: clear to auscultation bilaterally Cardio: S1, S2 normal GI: soft, non-tender; bowel sounds normal; no masses,  no organomegaly Extremities: extremities normal, atraumatic, no cyanosis or edema  Lab Results:    @labtest @ [facility] No results found for this basename: PHART, PCO2, PO2ART, TCO2, HCO3,  in the last 72 hours CULTURES Recent Results (from the past 240 hour(s))  MRSA PCR SCREENING     Status: None   Collection Time    05/23/13 12:00 PM      Result Value Ref Range Status   MRSA by PCR NEGATIVE  NEGATIVE Final   Comment:            The GeneXpert MRSA Assay (FDA     approved for NASAL specimens     only), is one component of a     comprehensive MRSA colonization     surveillance program. It is not     intended to diagnose MRSA     infection nor to guide or     monitor treatment for     MRSA infections.   Studies/Results: No results found.  Medications: I have reviewed the patient's current medications.  Assesment: Principal Problem:   Upper GI bleed Active Problems:   HTN (hypertension)   Hyperlipidemia   Anemia due to acute blood loss   Hematemesis    Plan: Medications reviewed Will continue to monitor CBC Continue BID PPI    LOS: 2  days   Ricardo Schubach 05/25/2013, 7:57 AM

## 2013-05-26 ENCOUNTER — Other Ambulatory Visit: Payer: Self-pay

## 2013-05-26 ENCOUNTER — Telehealth: Payer: Self-pay | Admitting: Gastroenterology

## 2013-05-26 ENCOUNTER — Encounter: Payer: Self-pay | Admitting: Gastroenterology

## 2013-05-26 ENCOUNTER — Other Ambulatory Visit: Payer: Self-pay | Admitting: Gastroenterology

## 2013-05-26 DIAGNOSIS — D62 Acute posthemorrhagic anemia: Secondary | ICD-10-CM

## 2013-05-26 LAB — CBC
HCT: 27.3 % — ABNORMAL LOW (ref 39.0–52.0)
HEMATOCRIT: 22.6 % — AB (ref 39.0–52.0)
HEMATOCRIT: 26.4 % — AB (ref 39.0–52.0)
Hemoglobin: 7.7 g/dL — ABNORMAL LOW (ref 13.0–17.0)
Hemoglobin: 9.3 g/dL — ABNORMAL LOW (ref 13.0–17.0)
Hemoglobin: 9.4 g/dL — ABNORMAL LOW (ref 13.0–17.0)
MCH: 33.6 pg (ref 26.0–34.0)
MCH: 33.8 pg (ref 26.0–34.0)
MCH: 32.8 pg (ref 26.0–34.0)
MCHC: 34.1 g/dL (ref 30.0–36.0)
MCHC: 35.2 g/dL (ref 30.0–36.0)
MCHC: 34.4 g/dL (ref 30.0–36.0)
MCV: 98.7 fL (ref 78.0–100.0)
MCV: 96 fL (ref 78.0–100.0)
MCV: 95.1 fL (ref 78.0–100.0)
PLATELETS: 91 10*3/uL — AB (ref 150–400)
Platelets: 100 10*3/uL — ABNORMAL LOW (ref 150–400)
Platelets: 96 10*3/uL — ABNORMAL LOW (ref 150–400)
RBC: 2.29 MIL/uL — AB (ref 4.22–5.81)
RBC: 2.75 MIL/uL — ABNORMAL LOW (ref 4.22–5.81)
RBC: 2.87 MIL/uL — ABNORMAL LOW (ref 4.22–5.81)
RDW: 15.7 % — ABNORMAL HIGH (ref 11.5–15.5)
RDW: 16.5 % — ABNORMAL HIGH (ref 11.5–15.5)
RDW: 16.6 % — AB (ref 11.5–15.5)
WBC: 4.9 10*3/uL (ref 4.0–10.5)
WBC: 5.5 10*3/uL (ref 4.0–10.5)
WBC: 5.8 10*3/uL (ref 4.0–10.5)

## 2013-05-26 LAB — PREPARE RBC (CROSSMATCH)

## 2013-05-26 MED ORDER — DIPHENHYDRAMINE HCL 25 MG PO CAPS
25.0000 mg | ORAL_CAPSULE | Freq: Four times a day (QID) | ORAL | Status: DC | PRN
  Administered 2013-05-26 (×2): 25 mg via ORAL
  Filled 2013-05-26 (×2): qty 1

## 2013-05-26 NOTE — Progress Notes (Signed)
REVIEWED.  

## 2013-05-26 NOTE — Progress Notes (Signed)
    Subjective: BM yesterday, no melena. No hematemesis. Wants to go home. Tolerating diet. No nausea, abdominal pain. 2 units PRBCs ordered.   Objective: Vital signs in last 24 hours: Temp:  [98.1 F (36.7 C)-99 F (37.2 C)] 99 F (37.2 C) (04/02 0531) Pulse Rate:  [48-67] 67 (04/02 0531) Resp:  [15-23] 20 (04/02 0531) BP: (96-116)/(32-53) 109/50 mmHg (04/02 0550) SpO2:  [94 %-100 %] 97 % (04/02 0531) Last BM Date: 05/23/13 General:   Alert and oriented, pleasant Head:  Normocephalic and atraumatic. Eyes:  No icterus, sclera clear. Conjuctiva pink.  Abdomen:  Bowel sounds present, soft, non-tender, non-distended. Sitting up in chair, exam limited.  Extremities:  Without clubbing or edema. Neurologic:  Alert and  oriented x4;  grossly normal neurologically. Psych:  Alert and cooperative. Normal mood and affect.  Intake/Output from previous day: 04/01 0701 - 04/02 0700 In: 1856.3 [I.V.:1856.3] Out: 826 [Urine:825; Stool:1] Intake/Output this shift:    Lab Results:  Recent Labs  05/25/13 1055 05/25/13 2305 05/26/13 0553  WBC 5.7 4.9 4.9  HGB 8.8*  8.7* 7.5* 7.7*  HCT 25.7*  25.8* 21.3* 22.6*  PLT 114* 101* 100*   BMET  Recent Labs  05/23/13 0830 05/24/13 0431  NA 144 144  K 5.0 3.9  CL 109 112  CO2 26 23  GLUCOSE 179* 94  BUN 57* 44*  CREATININE 1.18 0.90  CALCIUM 8.7 8.1*   LFT  Recent Labs  05/23/13 0830  PROT 4.8*  ALBUMIN 2.6*  AST 12  ALT 7  ALKPHOS 35*  BILITOT 0.3   PT/INR  Recent Labs  05/23/13 0830  LABPROT 14.7  INR 1.17   Assessment: 80 year old male with UGI bleed and acute blood loss anemia secondary to prepyloric gastric ulcerations in the setting of NSAID use. H.pylori serology in process. Drift in Hgb noted without further evidence of overt GI bleeding. Likely multifactorial. Tolerating diet and clinically improved from admission. Hopeful discharge later today after transfusions, with repeat CBC early next week.    Plan: Protonix BID Carafate for total of 10 days Follow-up on H.pylori serology Absolute avoidance of NSAIDs, aspirin for 12 weeks Repeat EGD in 12 weeks After transfusion, hopeful discharge home with close outpatient follow-up. We can arrange for repeat CBC early next week.   Orvil Feil, ANP-BC [facility]    LOS: 3 days    05/26/2013, 8:08 AM

## 2013-05-26 NOTE — Progress Notes (Signed)
05/26/13 1804 Late entry for 1415. Patient c/o itching and rash to back this afternoon about 1200 prior to starting blood transfusion. Stated "started yesterday". Redness noted to upper back. Paged Dr. Legrand Rams x 2 to notify. Received call back at 1415. Order received benadryl 25 mg po every 6 hours PRN itching. Benadryl given as ordered. Donavan Foil, RN

## 2013-05-26 NOTE — Telephone Encounter (Signed)
Please have patient follow-up in about 8 weeks to set up repeat EGD for surveillance

## 2013-05-26 NOTE — Progress Notes (Unsigned)
   Let's have a repeat CBC next Tuesday at [facility]. Patient does not need to be seen in the office, but we can have blood work done as outpatient to make sure Hgb stable.

## 2013-05-26 NOTE — Progress Notes (Signed)
Lab orders have been mailed to the pt. 

## 2013-05-26 NOTE — Progress Notes (Signed)
Subjective: Patient feels better. No nausea or vomiting. His stool was dark yesterday. He had no bowel movement today. His his H/H has dropped to 7.5/21.   Objective: Vital signs in last 24 hours: Temp:  [98.1 F (36.7 C)-99 F (37.2 C)] 99 F (37.2 C) (04/02 0531) Pulse Rate:  [48-67] 67 (04/02 0531) Resp:  [15-23] 20 (04/02 0531) BP: (96-116)/(32-53) 109/50 mmHg (04/02 0550) SpO2:  [94 %-100 %] 97 % (04/02 0531) Weight change:  Last BM Date: 05/23/13  Intake/Output from previous day: 04/01 0701 - 04/02 0700 In: 1856.3 [I.V.:1856.3] Out: 826 [Urine:825; Stool:1]  PHYSICAL EXAM General appearance: alert and no distress Resp: clear to auscultation bilaterally Cardio: S1, S2 normal GI: soft, non-tender; bowel sounds normal; no masses,  no organomegaly Extremities: extremities normal, atraumatic, no cyanosis or edema  Lab Results:    @labtest @ [facility] No results found for this basename: PHART, PCO2, PO2ART, TCO2, HCO3,  in the last 72 hours CULTURES Recent Results (from the past 240 hour(s))  MRSA PCR SCREENING     Status: None   Collection Time    05/23/13 12:00 PM      Result Value Ref Range Status   MRSA by PCR NEGATIVE  NEGATIVE Final   Comment:            The GeneXpert MRSA Assay (FDA     approved for NASAL specimens     only), is one component of a     comprehensive MRSA colonization     surveillance program. It is not     intended to diagnose MRSA     infection nor to guide or     monitor treatment for     MRSA infections.   Studies/Results: No results found.  Medications: I have reviewed the patient's current medications.  Assesment: Principal Problem:   Upper GI bleed Active Problems:   HTN (hypertension)   Hyperlipidemia   Anemia due to acute blood loss   Hematemesis    Plan: Medications reviewed Type cross match and transfuse 2 units of packed RBC. Will repeat H/H and if stabilize  Will discharge home.    LOS: 3 days    Harm Jou 05/26/2013, 8:06 AM

## 2013-05-26 NOTE — Progress Notes (Addendum)
05/26/13 1559 Notified Dr. Legrand Rams, 2nd unit of blood transfusing as ordered. Patient would like to be discharged home after transfusion complete if hemoglobin stable. Wife at bedside, states would like him to be discharged. MD aware blood transfusion started at lunch instead of earlier this morning per patient request to wait until after shower and wife arrived. Stated okay. Check H&H 2 hours after blood transfusion complete per MD request. Nursing to notify MD of result. Donavan Foil, RN

## 2013-05-27 LAB — TYPE AND SCREEN
ABO/RH(D): A POS
Antibody Screen: NEGATIVE
UNIT DIVISION: 0
UNIT DIVISION: 0
Unit division: 0

## 2013-05-27 LAB — H. PYLORI ANTIBODY, IGG: H Pylori IgG: >8 {ISR} — ABNORMAL HIGH

## 2013-05-27 MED ORDER — PANTOPRAZOLE SODIUM 40 MG PO TBEC
40.0000 mg | DELAYED_RELEASE_TABLET | Freq: Two times a day (BID) | ORAL | Status: DC
Start: 2013-05-27 — End: 2013-07-26

## 2013-05-27 MED ORDER — SUCRALFATE 1 GM/10ML PO SUSP: 1.0000 g | Freq: Three times a day (TID) | ORAL | Status: DC

## 2013-05-27 MED ORDER — FERROUS GLUCONATE 324 (38 FE) MG PO TABS: 324.0000 mg | ORAL_TABLET | Freq: Every day | ORAL | Status: DC

## 2013-05-27 NOTE — Progress Notes (Signed)
UR chart review completed.  

## 2013-05-27 NOTE — Discharge Summary (Signed)
Physician Discharge Summary  Patient ID: Juan Quinn MRN: [id number] DOB/AGE: [birth year] 80 y.o. Primary Care Physician:DONDIEGO,RICHARD M, MD Admit date: 05/23/2013 Discharge date: 05/27/2013    Discharge Diagnoses:  Principal Problem:   Upper GI bleed Active Problems:   HTN (hypertension)   Hyperlipidemia   Anemia due to acute blood loss   Hematemesis     Medication List    STOP taking these medications       aspirin 81 MG tablet     naproxen 500 MG tablet  Commonly known as:  NAPROSYN      TAKE these medications       allopurinol 100 MG tablet  Commonly known as:  ZYLOPRIM  Take 100 mg by mouth daily.     atenolol 25 MG tablet  Commonly known as:  TENORMIN  Take 25 mg by mouth daily.     B-12 PO  Take 500 mcg by mouth daily.     ferrous gluconate 324 MG tablet  Commonly known as:  FERGON  Take 1 tablet (324 mg total) by mouth daily with breakfast.     fish oil-omega-3 fatty acids 1000 MG capsule  Take 1 g by mouth daily.     HYDROcodone-acetaminophen 5-325 MG per tablet  Commonly known as:  NORCO/VICODIN  Take 1 tablet by mouth daily.     ipratropium 0.06 % nasal spray  Commonly known as:  ATROVENT  Place 2-3 sprays into both nostrils daily.     lisinopril 10 MG tablet  Commonly known as:  PRINIVIL,ZESTRIL  Take 10 mg by mouth daily.     pantoprazole 40 MG tablet  Commonly known as:  PROTONIX  Take 1 tablet (40 mg total) by mouth 2 (two) times daily before a meal.     simvastatin 40 MG tablet  Commonly known as:  ZOCOR  Take 20 mg by mouth every morning.     sucralfate 1 GM/10ML suspension  Commonly known as:  CARAFATE  Take 10 mLs (1 g total) by mouth 4 (four) times daily -  with meals and at bedtime.     tamsulosin 0.4 MG Caps capsule  Commonly known as:  FLOMAX  Take 1 capsule by mouth daily.     terazosin 5 MG capsule  Commonly known as:  HYTRIN  Take 5 mg by mouth once.     Vitamin D3 1000 UNITS Caps  Take 1 capsule by mouth daily.         Discharged Condition: improved    Consults: GI  Significant Diagnostic Studies: No results found.  Lab Results: Basic Metabolic Panel: No results found for this basename: NA, K, CL, CO2, GLUCOSE, BUN, CREATININE, CALCIUM, MG, PHOS,  in the last 72 hours Liver Function Tests: No results found for this basename: AST, ALT, ALKPHOS, BILITOT, PROT, ALBUMIN,  in the last 72 hours   CBC:  Recent Labs  05/26/13 1945 05/26/13 2312  WBC 5.5 5.8  HGB 9.3* 9.4*  HCT 26.4* 27.3*  MCV 96.0 95.1  PLT 96* 91*    Recent Results (from the past 240 hour(s))  MRSA PCR SCREENING     Status: None   Collection Time    05/23/13 12:00 PM      Result Value Ref Range Status   MRSA by PCR NEGATIVE  NEGATIVE Final   Comment:            The GeneXpert MRSA Assay (FDA     approved for NASAL specimens     only),  is one component of a     comprehensive MRSA colonization     surveillance program. It is not     intended to diagnose MRSA     infection nor to guide or     monitor treatment for     MRSA infections.     Hospital Course:  This is an 80 years old male patient who was admitted due to gI bleed. GI consult was done and EGD was done which showed prepyloric ulcer. He is started on protonox and carafate. Patient was transfused 2 units of PRBC. Advised to avoid NSAIDS. Patient is discharged in stable condition.  Discharge Exam: Blood pressure 119/55, pulse 56, temperature 98.6 F (37 C), temperature source Oral, resp. rate 18, height 5\' 9"  (1.753 m), weight 97.8 kg (215 lb 9.8 oz), SpO2 92.00%.   Disposition:  home        Follow-up Information   Follow up with [name], MD In 1 week.   Specialty:  Internal Medicine   Contact information:   [employer] [place] [address] [phone]       Signed: Quandarius Nill   05/27/2013, 7:55 AM

## 2013-05-27 NOTE — Progress Notes (Signed)
Patient discharged with instructions, prescriptions, and carenotes. He verbalized understanding via teach back method.  The patient left the floor with staff via w/c in stable condition. 

## 2013-05-30 ENCOUNTER — Encounter: Payer: Self-pay | Admitting: Gastroenterology

## 2013-05-30 NOTE — Telephone Encounter (Signed)
Pt is aware of OV on 6/1 at 10 with AS and appt card mailed

## 2013-06-01 ENCOUNTER — Telehealth: Payer: Self-pay

## 2013-06-01 ENCOUNTER — Other Ambulatory Visit: Payer: Self-pay | Admitting: Gastroenterology

## 2013-06-01 MED ORDER — AMOXICILL-CLARITHRO-LANSOPRAZ PO MISC: Freq: Two times a day (BID) | ORAL | Status: DC

## 2013-06-01 NOTE — Progress Notes (Signed)
Quick Note:  ADDENDUM: HOLD [place].  I have sent prescription to pharmacy. ______

## 2013-06-01 NOTE — Telephone Encounter (Signed)
error 

## 2013-07-19 ENCOUNTER — Encounter: Payer: Self-pay | Admitting: Gastroenterology

## 2013-07-25 ENCOUNTER — Ambulatory Visit: Payer: Medicare Other | Admitting: Gastroenterology

## 2013-07-26 ENCOUNTER — Encounter (INDEPENDENT_AMBULATORY_CARE_PROVIDER_SITE_OTHER): Payer: Self-pay

## 2013-07-26 ENCOUNTER — Encounter: Payer: Self-pay | Admitting: Gastroenterology

## 2013-07-26 ENCOUNTER — Ambulatory Visit (INDEPENDENT_AMBULATORY_CARE_PROVIDER_SITE_OTHER): Payer: Medicare Other | Admitting: Gastroenterology

## 2013-07-26 VITALS — BP 149/71 | HR 52 | Temp 97.3°F | Resp 20 | Wt 206.4 lb

## 2013-07-26 DIAGNOSIS — R7689 Other specified abnormal immunological findings in serum: Secondary | ICD-10-CM | POA: Insufficient documentation

## 2013-07-26 DIAGNOSIS — R768 Other specified abnormal immunological findings in serum: Secondary | ICD-10-CM | POA: Insufficient documentation

## 2013-07-26 DIAGNOSIS — R894 Abnormal immunological findings in specimens from other organs, systems and tissues: Secondary | ICD-10-CM

## 2013-07-26 DIAGNOSIS — K279 Peptic ulcer, site unspecified, unspecified as acute or chronic, without hemorrhage or perforation: Secondary | ICD-10-CM

## 2013-07-26 MED ORDER — PANTOPRAZOLE SODIUM 40 MG PO TBEC: 40.0000 mg | DELAYED_RELEASE_TABLET | Freq: Two times a day (BID) | ORAL | Status: DC

## 2013-07-26 NOTE — Assessment & Plan Note (Signed)
80 year old male with PUD, +H.pylori antibody with Prevpac treatment, presenting with need for surveillance EGD now. No upper or lower GI concerning symptoms, and he is without any evidence of overt GI bleeding. Hgb has since returned to baseline since hospitalization. Instructed he may stop iron, continue Protonix BID, and continue to avoid Aspirin until after next EGD is completed.   Proceed with upper endoscopy in the near future with Dr. Gala Romney. The risks, benefits, and alternatives have been discussed in detail with patient. They have stated understanding and desire to proceed.  Protonix BID Continuation of 81 mg ASA to be determined after EGD. This has been on hold since prior EGD due to PUD.

## 2013-07-26 NOTE — Patient Instructions (Signed)
You may stop iron. Stop Carafate.  Continue Protonix twice a day, 30 minutes before breakfast and dinner.   We have scheduled you for an upper endoscopy with Dr. Gala Romney to make sure the ulcer is healed.

## 2013-07-26 NOTE — Assessment & Plan Note (Signed)
Treated with Prevpac. Surveillance EGD due now.

## 2013-07-26 NOTE — Progress Notes (Signed)
     Referring Provider: Dondiego, Richard M, MD Primary Care Physician:  DONDIEGO,RICHARD M, MD Primary GI: Dr. Rourk   Chief Complaint  Patient presents with  . Follow-up    8 week check-up (repeat egd)    HPI:   Juan Quinn is a pleasant 80-year-old male appearing younger than stated age, with a history of UGI bleed and acute blood loss anemia secondary to prepyloric gastric ulcerations in the setting of NSAID use. Hospitalized in  March 2015 with EGD at that time. Subsequent H.pylori serology positive, and he was treated with Prevpac. Completed successfully. Hgb now back to 13.9 recently. No concerning upper or lower GI symptoms concerning. Without any evidence of overt GI bleeding. Needs refill on Protonix.   Past Medical History  Diagnosis Date  . HTN (hypertension)   . Hypercholesteremia   . Squamous cell carcinoma   . Adenomatous colon polyp 2000    Surveillance due 2017  . Helicobacter pylori antibody positive 2015    treatment with Prevpac    Past Surgical History  Procedure Laterality Date  . Shoulder    . Hemorrhoid surgery    . Colonoscopy  June 2012    Dr. Rourk: adenomatous polyps, due for surveillance if health permits in 2017  . Esophagogastroduodenoscopy N/A 05/24/2013    Dr. Rourk:Prepyloric gastric ulcerations-likely source of hematemesis-likely NSAID-related. H.PYLORI SEROLOGY POSITIVE, TREATED WITH PREVPAC  . Esophagogastroduodenoscopy N/A 05/23/2013    RMR:Incomplete EGD secondary to much clot and food debris in the stomach    Current Outpatient Prescriptions  Medication Sig Dispense Refill  . allopurinol (ZYLOPRIM) 100 MG tablet Take 100 mg by mouth daily.        . atenolol (TENORMIN) 25 MG tablet Take 25 mg by mouth daily.        . Cholecalciferol (VITAMIN D3) 1000 UNITS CAPS Take 1 capsule by mouth daily.        . Cyanocobalamin (B-12 PO) Take 500 mcg by mouth daily.        . ferrous gluconate (FERGON) 324 MG tablet Take 1 tablet (324 mg total)  by mouth daily with breakfast.  30 tablet  3  . fish oil-omega-3 fatty acids 1000 MG capsule Take 1 g by mouth daily.        . HYDROcodone-acetaminophen (NORCO/VICODIN) 5-325 MG per tablet Take 1 tablet by mouth daily.      . ipratropium (ATROVENT) 0.06 % nasal spray Place 2-3 sprays into both nostrils daily.       . lisinopril (PRINIVIL,ZESTRIL) 10 MG tablet Take 10 mg by mouth daily.        . pantoprazole (PROTONIX) 40 MG tablet Take 1 tablet (40 mg total) by mouth 2 (two) times daily before a meal.  60 tablet  5  . simvastatin (ZOCOR) 40 MG tablet Take 20 mg by mouth every morning.       . tamsulosin (FLOMAX) 0.4 MG CAPS capsule Take 1 capsule by mouth daily.      . terazosin (HYTRIN) 5 MG capsule Take 5 mg by mouth once.       No current facility-administered medications for this visit.    Allergies as of 07/26/2013  . (No Known Allergies)    Family History  Problem Relation Age of Onset  . Pancreatic cancer Sister   . Leukemia Brother   . Lung cancer Sister   . Stroke Mother   . Stroke Father   . Colon cancer Neg Hx     History     Social History  . Marital Status: Married    Spouse Name: N/A    Number of Children: 4  . Years of Education: N/A   Occupational History  . retired     sales   Social History Main Topics  . Smoking status: Former Smoker -- 2.00 packs/day for 50 years    Types: Cigarettes    Quit date: 07/28/2000  . Smokeless tobacco: Former User    Quit date: 02/25/1999  . Alcohol Use: Yes     Comment: couple glasses wine/week, occ beer or rmixed drink  . Drug Use: No  . Sexual Activity: None   Other Topics Concern  . None   Social History Narrative   Lives w/ wife          Review of Systems: As mentioned in HPI.   Physical Exam: BP 149/71  Pulse 52  Temp(Src) 97.3 F (36.3 C) (Oral)  Resp 20  Wt 206 lb 6.4 oz (93.622 kg) General:   Alert and oriented. No distress noted. Pleasant and cooperative.  Head:  Normocephalic and  atraumatic. Eyes:  Conjuctiva clear without scleral icterus. Mouth:  Oral mucosa pink and moist. Good dentition. No lesions. Neck:  Supple, without mass or thyromegaly. Heart:  S1, S2 present without murmurs, rubs, or gallops. Regular rate and rhythm. Abdomen:  +BS, soft, non-tender and non-distended. No rebound or guarding. No HSM or masses noted. Msk:  Symmetrical without gross deformities. Normal posture. Extremities:  Without edema. Neurologic:  Alert and  oriented x4;  grossly normal neurologically. Skin:  Intact without significant lesions or rashes. Cervical Nodes:  No significant cervical adenopathy. Psych:  Alert and cooperative. Normal mood and affect.  Outside recent Hbg 13.9. , 

## 2013-07-27 NOTE — Progress Notes (Signed)
cc'd to pcp 

## 2013-08-01 ENCOUNTER — Encounter (HOSPITAL_COMMUNITY): Payer: Self-pay | Admitting: Pharmacy Technician

## 2013-08-16 ENCOUNTER — Encounter (HOSPITAL_COMMUNITY)
Admission: RE | Disposition: A | Discharge status: Home / Self Care | Payer: Self-pay | Source: Ambulatory Visit | Attending: Internal Medicine

## 2013-08-16 ENCOUNTER — Ambulatory Visit (HOSPITAL_COMMUNITY)
Admission: RE | Admit: 2013-08-16 | Discharge: 2013-08-16 | Disposition: A | Discharge status: Home / Self Care | Payer: Medicare Other | Source: Ambulatory Visit | Attending: Internal Medicine | Admitting: Internal Medicine

## 2013-08-16 ENCOUNTER — Other Ambulatory Visit: Payer: Self-pay | Admitting: *Deleted

## 2013-08-16 ENCOUNTER — Encounter (HOSPITAL_COMMUNITY): Payer: Self-pay | Admitting: *Deleted

## 2013-08-16 DIAGNOSIS — E7800 Pure hypercholesterolemia, unspecified: Secondary | ICD-10-CM | POA: Diagnosis not present

## 2013-08-16 DIAGNOSIS — I10 Essential (primary) hypertension: Secondary | ICD-10-CM | POA: Diagnosis not present

## 2013-08-16 DIAGNOSIS — R768 Other specified abnormal immunological findings in serum: Secondary | ICD-10-CM

## 2013-08-16 DIAGNOSIS — Z09 Encounter for follow-up examination after completed treatment for conditions other than malignant neoplasm: Secondary | ICD-10-CM | POA: Diagnosis not present

## 2013-08-16 DIAGNOSIS — K254 Chronic or unspecified gastric ulcer with hemorrhage: Secondary | ICD-10-CM | POA: Diagnosis not present

## 2013-08-16 DIAGNOSIS — Z87891 Personal history of nicotine dependence: Secondary | ICD-10-CM | POA: Insufficient documentation

## 2013-08-16 DIAGNOSIS — Z79899 Other long term (current) drug therapy: Secondary | ICD-10-CM | POA: Diagnosis not present

## 2013-08-16 DIAGNOSIS — K279 Peptic ulcer, site unspecified, unspecified as acute or chronic, without hemorrhage or perforation: Secondary | ICD-10-CM

## 2013-08-16 HISTORY — PX: ESOPHAGOGASTRODUODENOSCOPY: SHX5428

## 2013-08-16 LAB — HEMOGLOBIN
HCT: 41 %
HEMOGLOBIN OTHER: 13.9

## 2013-08-16 SURGERY — EGD (ESOPHAGOGASTRODUODENOSCOPY)
Anesthesia: Moderate Sedation

## 2013-08-16 MED ORDER — ONDANSETRON HCL 4 MG/2ML IJ SOLN
INTRAMUSCULAR | Status: AC
  Filled 2013-08-16: qty 2

## 2013-08-16 MED ORDER — MIDAZOLAM HCL 5 MG/5ML IJ SOLN
INTRAMUSCULAR | Status: AC
  Filled 2013-08-16: qty 10

## 2013-08-16 MED ORDER — SODIUM CHLORIDE 0.9 % IV SOLN
INTRAVENOUS | Status: DC
  Administered 2013-08-16: 10:00:00 via INTRAVENOUS

## 2013-08-16 MED ORDER — MIDAZOLAM HCL 5 MG/5ML IJ SOLN
INTRAMUSCULAR | Status: DC | PRN
  Administered 2013-08-16: 2 mg via INTRAVENOUS

## 2013-08-16 MED ORDER — MEPERIDINE HCL 100 MG/ML IJ SOLN
INTRAMUSCULAR | Status: DC | PRN
  Administered 2013-08-16: 50 mg via INTRAVENOUS

## 2013-08-16 MED ORDER — ONDANSETRON HCL 4 MG/2ML IJ SOLN
INTRAMUSCULAR | Status: DC | PRN
  Administered 2013-08-16: 4 mg via INTRAVENOUS

## 2013-08-16 MED ORDER — LIDOCAINE VISCOUS 2 % MT SOLN
OROMUCOSAL | Status: AC
  Filled 2013-08-16: qty 15

## 2013-08-16 MED ORDER — STERILE WATER FOR IRRIGATION IR SOLN
Status: DC | PRN
  Administered 2013-08-16: 11:00:00

## 2013-08-16 MED ORDER — SODIUM CHLORIDE 0.9 % IV SOLN: INTRAVENOUS | Status: DC

## 2013-08-16 MED ORDER — MEPERIDINE HCL 100 MG/ML IJ SOLN
INTRAMUSCULAR | Status: AC
  Filled 2013-08-16: qty 2

## 2013-08-16 NOTE — Interval H&P Note (Signed)
History and Physical Interval Note:  08/16/2013 10:48 AM  Juan Quinn  has presented today for surgery, with the diagnosis of PUD H PYLORI  The various methods of treatment have been discussed with the patient and family. After consideration of risks, benefits and other options for treatment, the patient has consented to  Procedure(s) with comments: ESOPHAGOGASTRODUODENOSCOPY (EGD) (N/A) - 10:30 as a surgical intervention .  The patient's history has been reviewed, patient examined, no change in status, stable for surgery.  I have reviewed the patient's chart and labs.  Questions were answered to the patient's satisfaction.    No change. EGD per plan.The risks, benefits, limitations, alternatives and imponderables have been reviewed with the patient. Potential for esophageal dilation, biopsy, etc. have also been reviewed.  Questions have been answered. All parties agreeable. Manus Rudd

## 2013-08-16 NOTE — H&P (View-Only) (Signed)
Referring Provider: [name], MD Primary Care Physician:  [name], MD Primary GI: Dr. Gala Romney   Chief Complaint  Patient presents with  . Follow-up    8 week check-up (repeat egd)    HPI:   [place] Ice is a pleasant 80 year old male appearing younger than stated age, with a history of UGI bleed and acute blood loss anemia secondary to prepyloric gastric ulcerations in the setting of NSAID use. Hospitalized in  March 2015 with EGD at that time. Subsequent H.pylori serology positive, and he was treated with Prevpac. Completed successfully. Hgb now back to 13.9 recently. No concerning upper or lower GI symptoms concerning. Without any evidence of overt GI bleeding. Needs refill on Protonix.   Past Medical History  Diagnosis Date  . HTN (hypertension)   . Hypercholesteremia   . Squamous cell carcinoma   . Adenomatous colon polyp 2000    Surveillance due 2017  . Helicobacter pylori antibody positive 2015    treatment with Prevpac    Past Surgical History  Procedure Laterality Date  . Shoulder    . Hemorrhoid surgery    . Colonoscopy  June 2012    Dr. Gala Romney: adenomatous polyps, due for surveillance if health permits in [id number]  . Esophagogastroduodenoscopy N/A 05/24/2013    Dr. Rourk:Prepyloric gastric ulcerations-likely source of hematemesis-likely NSAID-related. H.PYLORI SEROLOGY POSITIVE, TREATED WITH PREVPAC  . Esophagogastroduodenoscopy N/A 05/23/2013    QIO:[id number] EGD secondary to much clot and food debris in the stomach    Current Outpatient Prescriptions  Medication Sig Dispense Refill  . allopurinol (ZYLOPRIM) 100 MG tablet Take 100 mg by mouth daily.        Marland Kitchen atenolol (TENORMIN) 25 MG tablet Take 25 mg by mouth daily.        . Cholecalciferol (VITAMIN D3) 1000 UNITS CAPS Take 1 capsule by mouth daily.        . Cyanocobalamin (B-12 PO) Take 500 mcg by mouth daily.        . ferrous gluconate (FERGON) 324 MG tablet Take 1 tablet (324 mg total)  by mouth daily with breakfast.  30 tablet  3  . fish oil-omega-3 fatty acids 1000 MG capsule Take 1 g by mouth daily.        Marland Kitchen HYDROcodone-acetaminophen (NORCO/VICODIN) 5-325 MG per tablet Take 1 tablet by mouth daily.      Marland Kitchen ipratropium (ATROVENT) 0.06 % nasal spray Place 2-3 sprays into both nostrils daily.       Marland Kitchen lisinopril (PRINIVIL,ZESTRIL) 10 MG tablet Take 10 mg by mouth daily.        . pantoprazole (PROTONIX) 40 MG tablet Take 1 tablet (40 mg total) by mouth 2 (two) times daily before a meal.  60 tablet  5  . simvastatin (ZOCOR) 40 MG tablet Take 20 mg by mouth every morning.       . tamsulosin (FLOMAX) 0.4 MG CAPS capsule Take 1 capsule by mouth daily.      Marland Kitchen terazosin (HYTRIN) 5 MG capsule Take 5 mg by mouth once.       No current facility-administered medications for this visit.    Allergies as of 07/26/2013  . (No Known Allergies)    Family History  Problem Relation Age of Onset  . Pancreatic cancer Sister   . Leukemia Brother   . Lung cancer Sister   . Stroke Mother   . Stroke Father   . Colon cancer Neg Hx     History  Social History  . Marital Status: Married    Spouse Name: N/A    Number of Children: 4  . Years of Education: N/A   Occupational History  . retired     Press photographer   Social History Main Topics  . Smoking status: Former Smoker -- 2.00 packs/day for 50 years    Types: Cigarettes    Quit date: 07/28/2000  . Smokeless tobacco: Former Systems developer    Quit date: 02/25/1999  . Alcohol Use: Yes     Comment: couple glasses wine/week, occ beer or rmixed drink  . Drug Use: No  . Sexual Activity: None   Other Topics Concern  . None   Social History Narrative   Lives w/ wife          Review of Systems: As mentioned in HPI.   Physical Exam: BP 149/71  Pulse 52  Temp(Src) 97.3 F (36.3 C) (Oral)  Resp 20  Wt 206 lb 6.4 oz (93.622 kg) General:   Alert and oriented. No distress noted. Pleasant and cooperative.  Head:  Normocephalic and  atraumatic. Eyes:  Conjuctiva clear without scleral icterus. Mouth:  Oral mucosa pink and moist. Good dentition. No lesions. Neck:  Supple, without mass or thyromegaly. Heart:  S1, S2 present without murmurs, rubs, or gallops. Regular rate and rhythm. Abdomen:  +BS, soft, non-tender and non-distended. No rebound or guarding. No HSM or masses noted. Msk:  Symmetrical without gross deformities. Normal posture. Extremities:  Without edema. Neurologic:  Alert and  oriented x4;  grossly normal neurologically. Skin:  Intact without significant lesions or rashes. Cervical Nodes:  No significant cervical adenopathy. Psych:  Alert and cooperative. Normal mood and affect.  Outside recent Hbg 13.9. ,

## 2013-08-16 NOTE — Op Note (Signed)
[facility] [address] [place], [address]   ENDOSCOPY PROCEDURE REPORT  PATIENT: Juan Quinn, Juan Quinn  MR#: [id number] BIRTHDATE: Mar 11, 1930 , 42  yrs. old GENDER: Male ENDOSCOPIST: R.  Garfield Cornea, MD FACP FACG REFERRED BY:  Lucia Gaskins, M.D. PROCEDURE DATE:  08/16/2013 PROCEDURE:     Diagnostic EGD  INDICATIONS:    Followup bleeding gastric ulcer disease; history of H. pylori-treated; history of NSAID exposure  INFORMED CONSENT:   The risks, benefits, limitations, alternatives and imponderables have been discussed.  The potential for biopsy, esophogeal dilation, etc. have also been reviewed.  Questions have been answered.  All parties agreeable.  Please see the history and physical in the medical record for more information.  MEDICATIONS: Versed 2 mg IV and Demerol 50 mg IV. Zofran 4 mg IV. Xylocaine gel orally  DESCRIPTION OF PROCEDURE:   The EG-[id number] ([id number])  endoscope was introduced through the mouth and advanced to the second portion of the duodenum without difficulty or limitations.  The mucosal surfaces were surveyed very carefully during advancement of the scope and upon withdrawal.  Retroflexion view of the proximal stomach and esophagogastric junction was performed.      FINDINGS: Normal esophagus. Stomach empty. deformity of the antrum. Scar present. Previously noted ulcers completely healed. Patent pylorus. Normal first and second portion of the duodenum.   THERAPEUTIC / DIAGNOSTIC MANEUVERS PERFORMED:  none   COMPLICATIONS:  None  IMPRESSION:  Previously noted  gastric ulcers completely healed. Antral scar and deformity as outlined above  RECOMMENDATIONS:  Continue to avoid all forms of aspirin/nonsteroidals. May stop acid suppression therapy as long as the offending agents avoided   _______________________________ R. Garfield Cornea, MD FACP [facility] eSigned:  R. Garfield Cornea, MD FACP [facility] 08/16/2013 11:13 AM     CC:

## 2013-08-16 NOTE — Discharge Instructions (Signed)
EGD Discharge instructions Please read the instructions outlined below and refer to this sheet in the next few weeks. These discharge instructions provide you with general information on caring for yourself after you leave the hospital. Your doctor may also give you specific instructions. While your treatment has been planned according to the most current medical practices available, unavoidable complications occasionally occur. If you have any problems or questions after discharge, please call your doctor. ACTIVITY  You may resume your regular activity but move at a slower pace for the next 24 hours.   Take frequent rest periods for the next 24 hours.   Walking will help expel (get rid of) the air and reduce the bloated feeling in your abdomen.   No driving for 24 hours (because of the anesthesia (medicine) used during the test).   You may shower.   Do not sign any important legal documents or operate any machinery for 24 hours (because of the anesthesia used during the test).  NUTRITION  Drink plenty of fluids.   You may resume your normal diet.   Begin with a light meal and progress to your normal diet.   Avoid alcoholic beverages for 24 hours or as instructed by your caregiver.  MEDICATIONS  You may resume your normal medications unless your caregiver tells you otherwise.  WHAT YOU CAN EXPECT TODAY  You may experience abdominal discomfort such as a feeling of fullness or gas pains.  FOLLOW-UP  Your doctor will discuss the results of your test with you.  SEEK IMMEDIATE MEDICAL ATTENTION IF ANY OF THE FOLLOWING OCCUR:  Excessive nausea (feeling sick to your stomach) and/or vomiting.   Severe abdominal pain and distention (swelling).   Trouble swallowing.   Temperature over 101 F (37.8 C).   Rectal bleeding or vomiting of blood.    Would continue to avoid all forms of aspirin/nonsteroidal drugs  If you avoid aspirin and nonsteroidal drugs you may stop Protonix;

## 2013-08-19 ENCOUNTER — Encounter (HOSPITAL_COMMUNITY): Payer: Self-pay | Admitting: Internal Medicine

## 2014-10-26 ENCOUNTER — Other Ambulatory Visit: Payer: Self-pay | Admitting: Physician Assistant

## 2014-10-26 DIAGNOSIS — C4492 Squamous cell carcinoma of skin, unspecified: Secondary | ICD-10-CM

## 2014-10-26 DIAGNOSIS — C4491 Basal cell carcinoma of skin, unspecified: Secondary | ICD-10-CM

## 2014-10-26 HISTORY — DX: Squamous cell carcinoma of skin, unspecified: C44.92

## 2014-10-26 HISTORY — DX: Basal cell carcinoma of skin, unspecified: C44.91

## 2015-10-02 ENCOUNTER — Other Ambulatory Visit (HOSPITAL_COMMUNITY): Payer: Self-pay | Admitting: Family Medicine

## 2015-10-02 ENCOUNTER — Ambulatory Visit (HOSPITAL_COMMUNITY)
Admission: RE | Admit: 2015-10-02 | Discharge: 2015-10-02 | Disposition: A | Discharge status: Home / Self Care | Payer: Medicare HMO | Source: Ambulatory Visit | Attending: Family Medicine | Admitting: Family Medicine

## 2015-10-02 DIAGNOSIS — R053 Chronic cough: Secondary | ICD-10-CM

## 2015-10-02 DIAGNOSIS — J9811 Atelectasis: Secondary | ICD-10-CM | POA: Diagnosis not present

## 2015-10-02 DIAGNOSIS — I517 Cardiomegaly: Secondary | ICD-10-CM | POA: Insufficient documentation

## 2015-10-02 DIAGNOSIS — R05 Cough: Secondary | ICD-10-CM | POA: Insufficient documentation

## 2016-07-17 ENCOUNTER — Other Ambulatory Visit: Payer: Self-pay | Admitting: Physician Assistant

## 2016-07-17 DIAGNOSIS — C4491 Basal cell carcinoma of skin, unspecified: Secondary | ICD-10-CM

## 2016-07-17 HISTORY — DX: Basal cell carcinoma of skin, unspecified: C44.91

## 2016-08-14 ENCOUNTER — Other Ambulatory Visit: Payer: Self-pay | Admitting: Physician Assistant

## 2016-10-06 ENCOUNTER — Telehealth: Payer: Self-pay | Admitting: Internal Medicine

## 2016-10-06 ENCOUNTER — Other Ambulatory Visit (HOSPITAL_COMMUNITY): Payer: Self-pay | Admitting: Family Medicine

## 2016-10-06 DIAGNOSIS — R197 Diarrhea, unspecified: Secondary | ICD-10-CM

## 2016-10-06 NOTE — Telephone Encounter (Signed)
[phone]  DONDIEGOS OFFICE [phone]) CALLED AND STATED THIS PATIENT HAS HAD DIARRHEA FOR 4 WEEKS AND NEEDS TO BE SEEN IN A FEW DAYS HERE. I EXPLAINED TO HER THAT OUR NEXT IS IN October AND SHE SAID DONDIEGO SAID IT WAS URGENT.  PATIENT WAS IN THEIR OFFICE AT TIME OF CALL AND WAS GOING HOME, PLEASE CALL DR. DONDIEGO'S OFFICE

## 2016-10-06 NOTE — Telephone Encounter (Signed)
Routing to Stacey  

## 2016-10-07 ENCOUNTER — Encounter: Payer: Self-pay | Admitting: Gastroenterology

## 2016-10-07 ENCOUNTER — Telehealth: Payer: Self-pay

## 2016-10-07 ENCOUNTER — Ambulatory Visit (INDEPENDENT_AMBULATORY_CARE_PROVIDER_SITE_OTHER): Payer: Medicare HMO | Admitting: Gastroenterology

## 2016-10-07 VITALS — BP 134/70 | HR 61 | Temp 97.2°F | Ht 69.0 in | Wt 208.2 lb

## 2016-10-07 DIAGNOSIS — R197 Diarrhea, unspecified: Secondary | ICD-10-CM | POA: Diagnosis not present

## 2016-10-07 NOTE — Patient Instructions (Addendum)
Start taking a probiotic once daily.   We are seeing if we can add the stool studies to the lab.   Keep the appointment for the CT scan that is upcoming.   For now, stay hydrated and follow a low-fiber diet.   Do not take any more antibiotics until we know what is going on.   Low-Fiber Diet Fiber is found in fruits, vegetables, and whole grains. A low-fiber diet restricts fibrous foods that are not digested in the small intestine. A diet containing about 10-15 grams of fiber per day is considered low fiber. Low-fiber diets may be used to:  Promote healing and rest the bowel during intestinal flare-ups.  Prevent blockage of a partially obstructed or narrowed gastrointestinal tract.  Reduce fecal weight and volume.  Slow the movement of feces.  You may be on a low-fiber diet as a transitional diet following surgery, after an injury (trauma), or because of a short (acute) or lifelong (chronic) illness. Your health care provider will determine the length of time you need to stay on this diet. What do I need to know about a low-fiber diet? Always check the fiber content on the packaging's Nutrition Facts label, especially on foods from the grains list. Ask your dietitian if you have questions about specific foods that are related to your condition, especially if the food is not listed below. In general, a low-fiber food will have less than 2 g of fiber. What foods can I eat? Grains All breads and crackers made with white flour. Sweet rolls, doughnuts, waffles, pancakes, [place] toast, bagels. Pretzels, Melba toast, zwieback. Well-cooked cereals, such as cornmeal, farina, or cream cereals. Dry cereals that do not contain whole grains, fruit, or nuts, such as refined corn, wheat, rice, and oat cereals. Potatoes prepared any way without skins, plain pastas and noodles, refined white rice. Use white flour for baking and making sauces. Use allowed list of grains for casseroles, dumplings, and  puddings. Vegetables Strained tomato and vegetable juices. Fresh lettuce, cucumber, spinach. Well-cooked (no skin or pulp) or canned vegetables, such as asparagus, bean sprouts, beets, carrots, green beans, mushrooms, potatoes, pumpkin, spinach, yellow squash, tomato sauce/puree, turnips, yams, and zucchini. Keep servings limited to  cup. Fruits All fruit juices except prune juice. Cooked or canned fruits without skin and seeds, such as applesauce, apricots, cherries, fruit cocktail, grapefruit, grapes, mandarin oranges, melons, peaches, pears, pineapple, and plums. Fresh fruits without skin, such as apricots, avocados, bananas, melons, pineapple, nectarines, and peaches. Keep servings limited to  cup or 1 piece. Meat and Other Protein Sources Ground or well-cooked tender beef, ham, veal, lamb, pork, or poultry. Eggs, plain cheese. Fish, oysters, shrimp, lobster, and other seafood. Liver, organ meats. Smooth nut butters. Dairy All milk products and alternative dairy substitutes, such as soy, rice, almond, and coconut, not containing added whole nuts, seeds, or added fruit. Beverages Decaf coffee, fruit, and vegetable juices or smoothies (small amounts, with no pulp or skins, and with fruits from allowed list), sports drinks, herbal tea. Condiments Ketchup, mustard, vinegar, cream sauce, cheese sauce, cocoa powder. Spices in moderation, such as allspice, basil, bay leaves, celery powder or leaves, cinnamon, cumin powder, curry powder, ginger, mace, marjoram, onion or garlic powder, oregano, paprika, parsley flakes, ground pepper, rosemary, sage, savory, tarragon, thyme, and turmeric. Sweets and Desserts Plain cakes and cookies, pie made with allowed fruit, pudding, custard, cream pie. Gelatin, fruit, ice, sherbet, frozen ice pops. Ice cream, ice milk without nuts. Plain hard candy, honey, jelly,  molasses, syrup, sugar, chocolate syrup, gumdrops, marshmallows. Limit overall sugar intake. Fats and  Oil Margarine, butter, cream, mayonnaise, salad oils, plain salad dressings made from allowed foods. Choose healthy fats such as olive oil, canola oil, and omega-3 fatty acids (such as found in salmon or tuna) when possible. Other Bouillon, broth, or cream soups made from allowed foods. Any strained soup. Casseroles or mixed dishes made with allowed foods. The items listed above may not be a complete list of recommended foods or beverages. Contact your dietitian for more options. What foods are not recommended? Grains All whole wheat and whole grain breads and crackers. Multigrains, rye, bran seeds, nuts, or coconut. Cereals containing whole grains, multigrains, bran, coconut, nuts, raisins. Cooked or dry oatmeal, steel-cut oats. Coarse wheat cereals, granola. Cereals advertised as high fiber. Potato skins. Whole grain pasta, wild or brown rice. Popcorn. Coconut flour. Bran, buckwheat, corn bread, multigrains, rye, wheat germ. Vegetables Fresh, cooked or canned vegetables, such as artichokes, asparagus, beet greens, broccoli, Brussels sprouts, cabbage, celery, cauliflower, corn, eggplant, kale, legumes or beans, okra, peas, and tomatoes. Avoid large servings of any vegetables, especially raw vegetables. Fruits Fresh fruits, such as apples with or without skin, berries, cherries, figs, grapes, grapefruit, guavas, kiwis, mangoes, oranges, papayas, pears, persimmons, pineapple, and pomegranate. Prune juice and juices with pulp, stewed or dried prunes. Dried fruits, dates, raisins. Fruit seeds or skins. Avoid large servings of all fresh fruits. Meats and Other Protein Sources Tough, fibrous meats with gristle. Chunky nut butter. Cheese made with seeds, nuts, or other foods not recommended. Nuts, seeds, legumes (beans, including baked beans), dried peas, beans, lentils. Dairy Yogurt or cheese that contains nuts, seeds, or added fruit. Beverages Fruit juices with high pulp, prune juice. Caffeinated coffee  and teas. Condiments Coconut, maple syrup, pickles, olives. Sweets and Desserts Desserts, cookies, or candies that contain nuts or coconut, chunky peanut butter, dried fruits. Jams, preserves with seeds, marmalade. Large amounts of sugar and sweets. Any other dessert made with fruits from the not recommended list. Other Soups made from vegetables that are not recommended or that contain other foods not recommended. The items listed above may not be a complete list of foods and beverages to avoid. Contact your dietitian for more information. This information is not intended to replace advice given to you by your health care provider. Make sure you discuss any questions you have with your health care provider. Document Released: 08/02/2001 Document Revised: 07/19/2015 Document Reviewed: 01/03/2013 Elsevier Interactive Patient Education  2017 [employer].

## 2016-10-07 NOTE — Telephone Encounter (Signed)
Thank you :)

## 2016-10-07 NOTE — Telephone Encounter (Signed)
Juan Quinn, I spoke with [name] at [facility] and she said the pt needs to complete the yellow and orange top bottles with stool samples to complete the orders.  Pt's wife is aware.

## 2016-10-07 NOTE — Telephone Encounter (Signed)
Patient coming for appointment 10/07/16

## 2016-10-07 NOTE — Progress Notes (Signed)
    Primary Care Physician:  Dondiego, Richard, MD Primary Gastroenterologist:  Dr. Rourk   Chief Complaint  Patient presents with  . Diarrhea    x4 wks  . Abdominal Pain  . Nausea  . Weight Loss    lost approx 10 lbs in past month  . little appetite    HPI:   Juan Quinn is a 83 y.o. male presenting today at the request of Dr. DonDiego secondary to diarrhea. He was last seen in June 2015 with history of PUD in setting of NSAID use. Surveillance EGD in June 2015 with healed site of previously noted ulcers. Last colonoscopy in 2012 with adenomatous polyps and due for surveillance in 5 years if health permits.   CT scheduled for tomorrow. Started having loose stool on July 23rd. Having at least 3 episodes daily. Sunday had 4 watery bowel movements. Some mucus in stool. Watery stool about 530 am this morning. Yesterday had some mushy stool that was not liquid. Diffuse abdominal discomfort. Feels like he has bad gas and can't get rid of it. No fever. Stays cold. No rectal bleeding. Some weight loss of about 10 lbs since acute illness started. Appetite is not very good. Nothing tastes good. Some nausea, no vomiting. No recent antibiotics prior to illness. Was given Flagyl 2 weeks ago, with Cipro added last Monday. Has been on Flagyl for a total of 2 weeks and Cipro a total of 1 week. Dropped off stool to lab this morning.   Took some Lomotil and got better and then quit taking it.   Past Medical History:  Diagnosis Date  . Adenomatous colon polyp 2000   Surveillance due 2017  . Helicobacter pylori antibody positive 2015   treatment with Prevpac  . HTN (hypertension)   . Hypercholesteremia   . Squamous cell carcinoma     Past Surgical History:  Procedure Laterality Date  . COLONOSCOPY  June 2012   Dr. Rourk: adenomatous polyps, due for surveillance if health permits in 2017  . ESOPHAGOGASTRODUODENOSCOPY N/A 05/24/2013   Dr. Rourk:Prepyloric gastric ulcerations-likely source of  hematemesis-likely NSAID-related. H.PYLORI SEROLOGY POSITIVE, TREATED WITH PREVPAC  . ESOPHAGOGASTRODUODENOSCOPY N/A 05/23/2013   RMR:Incomplete EGD secondary to much clot and food debris in the stomach  . ESOPHAGOGASTRODUODENOSCOPY N/A 08/16/2013   Dr. Rourk: normal esophagus, stomach empty, deformity of antrum. scar present. Previously noted ulcers completed healed. Patent pylorus, normal D1 and D2  . HEMORRHOID SURGERY    . shoulder      Current Outpatient Prescriptions  Medication Sig Dispense Refill  . allopurinol (ZYLOPRIM) 100 MG tablet Take 300 mg by mouth daily.     . amLODipine (NORVASC) 5 MG tablet Take 5 mg by mouth daily.  3  . Cholecalciferol (VITAMIN D3) 1000 UNITS CAPS Take 1 capsule by mouth daily.      . ciprofloxacin (CIPRO) 500 MG tablet Take 500 mg by mouth 2 (two) times daily.  0  . Cyanocobalamin (B-12 PO) Take 1,000 mcg by mouth daily.     . fish oil-omega-3 fatty acids 1000 MG capsule Take 1 g by mouth daily.      . ipratropium (ATROVENT) 0.06 % nasal spray Place 2-3 sprays into both nostrils daily.     . LORazepam (ATIVAN) 1 MG tablet Take 1 mg by mouth at bedtime.  0  . metroNIDAZOLE (FLAGYL) 500 MG tablet Take 500 mg by mouth 3 (three) times daily.  0  . simvastatin (ZOCOR) 40 MG tablet Take 40   mg by mouth every morning.     . terazosin (HYTRIN) 5 MG capsule Take 5 mg by mouth once.     No current facility-administered medications for this visit.     Allergies as of 10/07/2016  . (No Known Allergies)    Family History  Problem Relation Age of Onset  . Stroke Mother   . Stroke Father   . Pancreatic cancer Sister   . Leukemia Brother   . Lung cancer Sister   . Colon cancer Neg Hx     Social History   Social History  . Marital status: Married    Spouse name: N/A  . Number of children: 4  . Years of education: N/A   Occupational History  . retired     sales   Social History Main Topics  . Smoking status: Former Smoker    Packs/day: 2.00     Years: 50.00    Types: Cigarettes    Quit date: 07/28/2000  . Smokeless tobacco: Former User    Quit date: 02/25/1999  . Alcohol use Yes     Comment: couple glasses wine/week, occ beer or rmixed drink  . Drug use: No  . Sexual activity: Not on file   Other Topics Concern  . Not on file   Social History Narrative   Lives w/ wife          Review of Systems: As mentioned in HPI   Physical Exam: BP 134/70   Pulse 61   Temp (!) 97.2 F (36.2 C) (Oral)   Ht 5' 9" (1.753 m)   Wt 208 lb 3.2 oz (94.4 kg)   BMI 30.75 kg/m  General:   Alert and oriented. Pleasant and cooperative. Well-nourished and well-developed.  Head:  Normocephalic and atraumatic. Eyes:  Without icterus, sclera clear and conjunctiva pink.  Ears:  Normal auditory acuity. Nose:  No deformity, discharge,  or lesions. Mouth:  No deformity or lesions, oral mucosa pink.  Lungs:  Clear to auscultation bilaterally. No wheezes, rales, or rhonchi. No distress.  Heart:  S1, S2 present without murmurs appreciated.  Abdomen:  +BS, soft, TTP lower abdomen and non-distended. No HSM noted. No guarding or rebound. No masses appreciated.  Rectal:  Deferred  Msk:  Symmetrical without gross deformities. Normal posture. Extremities:  Without  edema. Neurologic:  Alert and  oriented x4 Psych:  Alert and cooperative. Normal mood and affect.   

## 2016-10-08 ENCOUNTER — Ambulatory Visit (HOSPITAL_COMMUNITY)
Admission: RE | Admit: 2016-10-08 | Discharge: 2016-10-08 | Disposition: A | Discharge status: Home / Self Care | Payer: Medicare HMO | Source: Ambulatory Visit | Attending: Family Medicine | Admitting: Family Medicine

## 2016-10-08 DIAGNOSIS — K5730 Diverticulosis of large intestine without perforation or abscess without bleeding: Secondary | ICD-10-CM | POA: Insufficient documentation

## 2016-10-08 DIAGNOSIS — R197 Diarrhea, unspecified: Secondary | ICD-10-CM | POA: Insufficient documentation

## 2016-10-08 NOTE — Assessment & Plan Note (Addendum)
83 year old male presenting with several week history of loose stool, associated weight loss, poor appetite, vague abdominal pain. Evaluated by PCP and empirically started on Flagyl 2 weeks ago and added Cipro last week (total of Flagyl 2 weeks and Cipro 1 week). No improvement in symptoms. I called PCP's office to see what stool study had already been ordered, and it appears something for Cdiff was ordered. CT scheduled for tomorrow, which I have advised patient to keep this appointment due to his constellation of symptoms.   Although he has not had any recent antibiotic exposure prior to this bout of acute diarrheal illness, need to rule out CDI. I have ordered additional stool studies as well. Will add probiotic, discussed cleaning of house. I discussed that if stool studies and CT are negative, he will need a colonoscopy with colonic biopsies likely. Follow low residue diet for now. Stop antibiotics for now, as he has not had any improvement with this and would need vancomycin as first line treatment if dealing with CDI.

## 2016-10-08 NOTE — Telephone Encounter (Signed)
Can we find out what is going on with the stool studies? Looks like cdiff is not being processed.

## 2016-10-08 NOTE — Progress Notes (Signed)
cc'ed to pcp °

## 2016-10-09 ENCOUNTER — Telehealth: Payer: Self-pay

## 2016-10-09 ENCOUNTER — Telehealth: Payer: Self-pay | Admitting: Internal Medicine

## 2016-10-09 ENCOUNTER — Other Ambulatory Visit: Payer: Self-pay

## 2016-10-09 DIAGNOSIS — R197 Diarrhea, unspecified: Secondary | ICD-10-CM

## 2016-10-09 MED ORDER — PEG 3350-KCL-NA BICARB-NACL 420 G PO SOLR: 4000.0000 mL | ORAL | 0 refills | Status: DC

## 2016-10-09 NOTE — Progress Notes (Signed)
Called and spoke to pt's wife. Informed her of AB's recommendation. She was concerned for pt to have Colonoscopy at his age and stool results pending. Informed her that the stool test takes a few days and nurse would call her after we receive the results. She said he is still having diarrhea and abdominal pain. She is ok to go ahead and schedule the colonoscopy. Colonoscopy with RMR scheduled for 10/24/16 at 2:00pm. Instructions mailed. Orders entered.  She said he doesn't have the Lomotil and needs rx sent to [facility] in [place] (phone note was sent to AB).

## 2016-10-09 NOTE — Telephone Encounter (Signed)
I spoke with Caryn Section at [facility].  She said the stool culture and giardia are pending. She has the C Diff results from Dr. Lorriane Shire and will fax those to [place].

## 2016-10-09 NOTE — Telephone Encounter (Signed)
Please call patient as soon as his results from his tests are in, wife stated he is very sick with diarrhea

## 2016-10-09 NOTE — Telephone Encounter (Signed)
See addendum notification for OV 10/07/16. Pt's wife said he needs the Lom[facility]il rx [facility] to [facility] [place]. Routing to AB.

## 2016-10-09 NOTE — Progress Notes (Addendum)
Reviewed CT without contrast ordered by Dr. Cindie Laroche: no evidence of colitis or acute finding.   10/08/16: Cdiff toxin and antigen both negative on outside labs. BUN 13, Cr 0.92, LFTs normal. I ordered stool culture, which is pending.   Take Imodium as needed. Let's go ahead and get him scheduled for a colonoscopy with Dr. Gala Romney with random colonic biopsies. Bentyl sent to pharmacy. To ED if worsening.

## 2016-10-10 ENCOUNTER — Ambulatory Visit (HOSPITAL_COMMUNITY): Payer: PRIVATE HEALTH INSURANCE

## 2016-10-10 LAB — GIARDIA ANTIGEN

## 2016-10-10 MED ORDER — DICYCLOMINE HCL 10 MG PO CAPS: 10.0000 mg | ORAL_CAPSULE | Freq: Three times a day (TID) | ORAL | 3 refills | Status: DC

## 2016-10-10 NOTE — Telephone Encounter (Signed)
I sent in Bentyl to take before meals and bedtime. Imodium prn. Wife states he has persistent loose stool. I informed to seek medical care if worsening despite these supportive measures. Watch for signs/symptoms of dehydration.

## 2016-10-10 NOTE — Telephone Encounter (Signed)
Tretha Sciara spoke with the pts wife yesterday. See addendum to ov note.

## 2016-10-10 NOTE — Addendum Note (Signed)
Addended by: Annitta Needs on: 10/10/2016 12:38 PM   Modules accepted: Orders

## 2016-10-11 LAB — STOOL CULTURE

## 2016-10-14 ENCOUNTER — Telehealth: Payer: Self-pay

## 2016-10-14 NOTE — Telephone Encounter (Signed)
Pt's wife called office. She would like to talk to AB about her husband's recent results. [phone].

## 2016-10-15 NOTE — Telephone Encounter (Signed)
Wife wants to know if he has IBS.   On 8/19 had semi-normal stool, gas, then diarrhea. On 8/20 had small amount of mushy and liquid stool. Yesterday with liquid stool, small amount.   Frequency has improved. Down to a few times a day of stool occurrences. Overall feels slightly improved.   I have asked him to increase Bentyl to TID. Monitor for side effects as discussed. Keep plans for colonoscopy.

## 2016-10-21 ENCOUNTER — Telehealth: Payer: Self-pay | Admitting: Internal Medicine

## 2016-10-21 NOTE — Telephone Encounter (Signed)
Pt's wife called asking to speak with AB. I told her AB was with patients and I could take a message. She said that the patient is scheduled for a colonoscopy on Friday with RMR. Pt has had diarrhea since July 23 and has lost 15 pounds. She is concerned about how he will be with drinking the prep. Please advise and call her at [phone]

## 2016-10-22 ENCOUNTER — Telehealth: Payer: Self-pay | Admitting: Internal Medicine

## 2016-10-22 NOTE — Telephone Encounter (Signed)
Appetite is better and following the diet list. Stomach discomfort much improved. Bentyl QID. Still with diarrhea and losing weight. Urine clear. Keep plans for colonoscopy on Friday.

## 2016-10-22 NOTE — Telephone Encounter (Signed)
Pt's wife called asking to speak with AB. I told her AB was with patients and I could take a message. She said that the patient is scheduled for a colonoscopy on Friday with RMR. Pt has had diarrhea since July 23 and has lost 15 pounds. She is concerned about how he will be with drinking the prep. Please advise and call her at [phone]

## 2016-10-22 NOTE — Telephone Encounter (Signed)
Addressed on 8/29.

## 2016-10-24 ENCOUNTER — Encounter (HOSPITAL_COMMUNITY)
Admission: RE | Disposition: A | Discharge status: Home / Self Care | Payer: Self-pay | Source: Ambulatory Visit | Attending: Internal Medicine

## 2016-10-24 ENCOUNTER — Encounter (HOSPITAL_COMMUNITY): Payer: Self-pay | Admitting: *Deleted

## 2016-10-24 ENCOUNTER — Ambulatory Visit (HOSPITAL_COMMUNITY)
Admission: RE | Admit: 2016-10-24 | Discharge: 2016-10-24 | Disposition: A | Discharge status: Home / Self Care | Payer: Medicare HMO | Source: Ambulatory Visit | Attending: Internal Medicine | Admitting: Internal Medicine

## 2016-10-24 DIAGNOSIS — Z79899 Other long term (current) drug therapy: Secondary | ICD-10-CM | POA: Diagnosis not present

## 2016-10-24 DIAGNOSIS — I10 Essential (primary) hypertension: Secondary | ICD-10-CM | POA: Insufficient documentation

## 2016-10-24 DIAGNOSIS — D122 Benign neoplasm of ascending colon: Secondary | ICD-10-CM | POA: Diagnosis not present

## 2016-10-24 DIAGNOSIS — Z8719 Personal history of other diseases of the digestive system: Secondary | ICD-10-CM | POA: Diagnosis not present

## 2016-10-24 DIAGNOSIS — D124 Benign neoplasm of descending colon: Secondary | ICD-10-CM | POA: Diagnosis not present

## 2016-10-24 DIAGNOSIS — K5730 Diverticulosis of large intestine without perforation or abscess without bleeding: Secondary | ICD-10-CM | POA: Insufficient documentation

## 2016-10-24 DIAGNOSIS — E7800 Pure hypercholesterolemia, unspecified: Secondary | ICD-10-CM | POA: Diagnosis not present

## 2016-10-24 DIAGNOSIS — K635 Polyp of colon: Secondary | ICD-10-CM | POA: Insufficient documentation

## 2016-10-24 DIAGNOSIS — Z87891 Personal history of nicotine dependence: Secondary | ICD-10-CM | POA: Diagnosis not present

## 2016-10-24 DIAGNOSIS — K648 Other hemorrhoids: Secondary | ICD-10-CM | POA: Diagnosis not present

## 2016-10-24 DIAGNOSIS — K529 Noninfective gastroenteritis and colitis, unspecified: Secondary | ICD-10-CM | POA: Insufficient documentation

## 2016-10-24 DIAGNOSIS — R197 Diarrhea, unspecified: Secondary | ICD-10-CM

## 2016-10-24 HISTORY — PX: COLONOSCOPY: SHX5424

## 2016-10-24 HISTORY — PX: BIOPSY: SHX5522

## 2016-10-24 HISTORY — PX: POLYPECTOMY: SHX5525

## 2016-10-24 LAB — C DIFFICILE QUICK SCREEN W PCR REFLEX
C Diff antigen: NEGATIVE
C Diff interpretation: NOT DETECTED
C Diff toxin: NEGATIVE

## 2016-10-24 SURGERY — COLONOSCOPY
Anesthesia: Moderate Sedation

## 2016-10-24 MED ORDER — STERILE WATER FOR IRRIGATION IR SOLN
Status: DC | PRN
  Administered 2016-10-24: 10:00:00

## 2016-10-24 MED ORDER — ONDANSETRON HCL 4 MG/2ML IJ SOLN
INTRAMUSCULAR | Status: AC
  Filled 2016-10-24: qty 2

## 2016-10-24 MED ORDER — MIDAZOLAM HCL 5 MG/5ML IJ SOLN
INTRAMUSCULAR | Status: AC
  Filled 2016-10-24: qty 10

## 2016-10-24 MED ORDER — MEPERIDINE HCL 100 MG/ML IJ SOLN
INTRAMUSCULAR | Status: DC | PRN
  Administered 2016-10-24 (×2): 25 mg via INTRAVENOUS

## 2016-10-24 MED ORDER — ONDANSETRON HCL 4 MG/2ML IJ SOLN
INTRAMUSCULAR | Status: DC | PRN
  Administered 2016-10-24: 4 mg via INTRAVENOUS

## 2016-10-24 MED ORDER — MIDAZOLAM HCL 5 MG/5ML IJ SOLN
INTRAMUSCULAR | Status: DC | PRN
  Administered 2016-10-24: 2 mg via INTRAVENOUS
  Administered 2016-10-24 (×3): 1 mg via INTRAVENOUS

## 2016-10-24 MED ORDER — SODIUM CHLORIDE 0.9 % IV SOLN
INTRAVENOUS | Status: DC
  Administered 2016-10-24: 1000 mL via INTRAVENOUS

## 2016-10-24 MED ORDER — MEPERIDINE HCL 100 MG/ML IJ SOLN
INTRAMUSCULAR | Status: AC
  Filled 2016-10-24: qty 2

## 2016-10-24 NOTE — Interval H&P Note (Signed)
History and Physical Interval Note:  10/24/2016 9:51 AM  Juan Quinn  has presented today for surgery, with the diagnosis of diarrhea of presumed infectious origin  The various methods of treatment have been discussed with the patient and family. After consideration of risks, benefits and other options for treatment, the patient has consented to  Procedure(s) with comments: COLONOSCOPY (N/A) - 2:00pm as a surgical intervention .  The patient's history has been reviewed, patient examined, no change in status, stable for surgery.  I have reviewed the patient's chart and labs.  Questions were answered to the patient's satisfaction.     Lattie Cervi  Diarrhea persists. Diagnostic colonoscopy for plan.  The risks, benefits, limitations, alternatives and imponderables have been reviewed with the patient. Questions have been answered. All parties are agreeable.

## 2016-10-24 NOTE — H&P (View-Only) (Signed)
Primary Care Physician:  Lucia Gaskins, MD Primary Gastroenterologist:  Dr. Gala Romney   Chief Complaint  Patient presents with  . Diarrhea    x4 wks  . Abdominal Pain  . Nausea  . Weight Loss    lost approx 10 lbs in past month  . little appetite    HPI:   Juan Quinn is a 83 y.o. male presenting today at the request of Dr. Cindie Laroche secondary to diarrhea. He was last seen in June 2015 with history of PUD in setting of NSAID use. Surveillance EGD in June 2015 with healed site of previously noted ulcers. Last colonoscopy in 2012 with adenomatous polyps and due for surveillance in 5 years if health permits.   CT scheduled for tomorrow. Started having loose stool on July 23rd. Having at least 3 episodes daily. Sunday had 4 watery bowel movements. Some mucus in stool. Watery stool about 530 am this morning. Yesterday had some mushy stool that was not liquid. Diffuse abdominal discomfort. Feels like he has bad gas and can't get rid of it. No fever. Stays cold. No rectal bleeding. Some weight loss of about 10 lbs since acute illness started. Appetite is not very good. Nothing tastes good. Some nausea, no vomiting. No recent antibiotics prior to illness. Was given Flagyl 2 weeks ago, with Cipro added last Monday. Has been on Flagyl for a total of 2 weeks and Cipro a total of 1 week. Dropped off stool to lab this morning.   Took some Lomotil and got better and then quit taking it.   Past Medical History:  Diagnosis Date  . Adenomatous colon polyp 2000   Surveillance due 2017  . Helicobacter pylori antibody positive 2015   treatment with Prevpac  . HTN (hypertension)   . Hypercholesteremia   . Squamous cell carcinoma     Past Surgical History:  Procedure Laterality Date  . COLONOSCOPY  June 2012   Dr. Gala Romney: adenomatous polyps, due for surveillance if health permits in 3235  . ESOPHAGOGASTRODUODENOSCOPY N/A 05/24/2013   Dr. Rourk:Prepyloric gastric ulcerations-likely source of  hematemesis-likely NSAID-related. H.PYLORI SEROLOGY POSITIVE, TREATED WITH PREVPAC  . ESOPHAGOGASTRODUODENOSCOPY N/A 05/23/2013   TDD:[id number] EGD secondary to much clot and food debris in the stomach  . ESOPHAGOGASTRODUODENOSCOPY N/A 08/16/2013   Dr. Gala Romney: normal esophagus, stomach empty, deformity of antrum. scar present. Previously noted ulcers completed healed. Patent pylorus, normal D1 and D2  . HEMORRHOID SURGERY    . shoulder      Current Outpatient Prescriptions  Medication Sig Dispense Refill  . allopurinol (ZYLOPRIM) 100 MG tablet Take 300 mg by mouth daily.     Marland Kitchen amLODipine (NORVASC) 5 MG tablet Take 5 mg by mouth daily.  3  . Cholecalciferol (VITAMIN D3) 1000 UNITS CAPS Take 1 capsule by mouth daily.      . ciprofloxacin (CIPRO) 500 MG tablet Take 500 mg by mouth 2 (two) times daily.  0  . Cyanocobalamin (B-12 PO) Take 1,000 mcg by mouth daily.     . fish oil-omega-3 fatty acids 1000 MG capsule Take 1 g by mouth daily.      Marland Kitchen ipratropium (ATROVENT) 0.06 % nasal spray Place 2-3 sprays into both nostrils daily.     Marland Kitchen LORazepam (ATIVAN) 1 MG tablet Take 1 mg by mouth at bedtime.  0  . metroNIDAZOLE (FLAGYL) 500 MG tablet Take 500 mg by mouth 3 (three) times daily.  0  . simvastatin (ZOCOR) 40 MG tablet Take 40  mg by mouth every morning.     . terazosin (HYTRIN) 5 MG capsule Take 5 mg by mouth once.     No current facility-administered medications for this visit.     Allergies as of 10/07/2016  . (No Known Allergies)    Family History  Problem Relation Age of Onset  . Stroke Mother   . Stroke Father   . Pancreatic cancer Sister   . Leukemia Brother   . Lung cancer Sister   . Colon cancer Neg Hx     Social History   Social History  . Marital status: Married    Spouse name: N/A  . Number of children: 4  . Years of education: N/A   Occupational History  . retired     Press photographer   Social History Main Topics  . Smoking status: Former Smoker    Packs/day: 2.00     Years: 50.00    Types: Cigarettes    Quit date: 07/28/2000  . Smokeless tobacco: Former Systems developer    Quit date: 02/25/1999  . Alcohol use Yes     Comment: couple glasses wine/week, occ beer or rmixed drink  . Drug use: No  . Sexual activity: Not on file   Other Topics Concern  . Not on file   Social History Narrative   Lives w/ wife          Review of Systems: As mentioned in HPI   Physical Exam: BP 134/70   Pulse 61   Temp (!) 97.2 F (36.2 C) (Oral)   Ht 5\' 9"  (1.753 m)   Wt 208 lb 3.2 oz (94.4 kg)   BMI 30.75 kg/m  General:   Alert and oriented. Pleasant and cooperative. Well-nourished and well-developed.  Head:  Normocephalic and atraumatic. Eyes:  Without icterus, sclera clear and conjunctiva pink.  Ears:  Normal auditory acuity. Nose:  No deformity, discharge,  or lesions. Mouth:  No deformity or lesions, oral mucosa pink.  Lungs:  Clear to auscultation bilaterally. No wheezes, rales, or rhonchi. No distress.  Heart:  S1, S2 present without murmurs appreciated.  Abdomen:  +BS, soft, TTP lower abdomen and non-distended. No HSM noted. No guarding or rebound. No masses appreciated.  Rectal:  Deferred  Msk:  Symmetrical without gross deformities. Normal posture. Extremities:  Without  edema. Neurologic:  Alert and  oriented x4 Psych:  Alert and cooperative. Normal mood and affect.

## 2016-10-24 NOTE — Op Note (Signed)
[facility] Patient Name: Juan Quinn Procedure Date: 10/24/2016 9:35 AM MRN: [id number] Date of Birth: [birth year] Attending MD: Norvel Richards , MD CSN: [id number] Age: 83 Admit Type: Outpatient Procedure:                Colonoscopy Indications:              Chronic diarrhea Providers:                Norvel Richards, MD, Jeanann Lewandowsky. Gwenlyn Perking RN, RN,                            Randa Spike, Technician Referring MD:              Medicines:                Midazolam 5 mg IV, Meperidine 50 mg IV, Ondansetron                            4 mg IV Complications:            No immediate complications. Estimated Blood Loss:     Estimated blood loss was minimal. Procedure:                Pre-Anesthesia Assessment:                           - Prior to the procedure, a History and Physical                            was performed, and patient medications and                            allergies were reviewed. The patient's tolerance of                            previous anesthesia was also reviewed. The risks                            and benefits of the procedure and the sedation                            options and risks were discussed with the patient.                            All questions were answered, and informed consent                            was obtained. Prior Anticoagulants: The patient has                            taken no previous anticoagulant or antiplatelet                            agents. ASA Grade Assessment: III - A patient with  severe systemic disease. After reviewing the risks                            and benefits, the patient was deemed in                            satisfactory condition to undergo the procedure.                           After obtaining informed consent, the colonoscope                            was passed under direct vision. Throughout the                            procedure, the patient's blood  pressure, pulse, and                            oxygen saturations were monitored continuously. The                            [id number]) scope was introduced through the                            anus and advanced to the the cecum, identified by                            appendiceal orifice and ileocecal valve. The                            colonoscopy was performed without difficulty. The                            patient tolerated the procedure well. The quality                            of the bowel preparation was adequate. The                            ileocecal valve, appendiceal orifice, and rectum                            were photographed. The entire colon was well                            visualized. Scope In: 10:02:34 AM Scope Out: 10:41:34 AM Scope Withdrawal Time: 0 hours 21 minutes 11 seconds  Total Procedure Duration: 0 hours 39 minutes 0 seconds  Findings:      The perianal and digital rectal examinations were normal.      Many small and large-mouthed diverticula were found in the entire colon.       Segmental biopsies of ascending and left colon taken for histologic       study. Stool sample submitted to the microbiology lab.      A  4 mm polyp was found in the descending colon. The polyp was       semi-pedunculated. The polyp was removed with a cold snare. Resection       and retrieval were complete. Estimated blood loss was minimal.      A 5 mm polyp was found in the ascending colon. The polyp was       semi-pedunculated. The polyp was removed with a cold snare. Resection       and retrieval were complete. Estimated blood loss was minimal.      A 12 mm polyp was found in the ascending colon. The polyp was       semi-pedunculated. The polyp was removed with a hot snare. Resection and       retrieval were complete. Estimated blood loss: none.      Internal hemorrhoids were found during retroflexion. The distal 5 cm of       terminal ileal mucosa  appeared normal.and      The exam was otherwise without abnormality on direct and retroflexion       views. Impression:               - Diverticulosis in the entire examined colon.                            segmental biopsies taken.                           - One 4 mm polyp in the descending colon, removed                            with a cold snare. Resected and retrieved.                           - One 5 mm polyp in the ascending colon, removed                            with a cold snare. Resected and retrieved.                           - One 12 mm polyp in the ascending colon, removed                            with a hot snare. Resected and retrieved.                           - Internal hemorrhoids.                           - The examination was otherwise normal on direct                            and retroflexion views. Moderate Sedation:      Moderate (conscious) sedation was administered by the endoscopy nurse       and supervised by the endoscopist. The following parameters were       monitored: oxygen saturation, heart rate, blood pressure, respiratory       rate, EKG, adequacy of  pulmonary ventilation, and response to care.       Total physician intraservice time was 47 minutes. Recommendation:           - Patient has a contact number available for                            emergencies. The signs and symptoms of potential                            delayed complications were discussed with the                            patient. Return to normal activities tomorrow.                            Written discharge instructions were provided to the                            patient.                           - Resume previous diet.                           - Continue present medications.                           - No repeat colonoscopy due to age.                           - Return to GI clinic (date not yet determined). Procedure Code(s):        --- Professional ---                            [phone], Colonoscopy, flexible; with removal of                            tumor(s), polyp(s), or other lesion(s) by snare                            technique                           45380, 59, Colonoscopy, flexible; with biopsy,                            single or multiple                           99152, Moderate sedation services provided by the                            same physician or other qualified health care                            professional performing the diagnostic or  therapeutic service that the sedation supports,                            requiring the presence of an independent trained                            observer to assist in the monitoring of the                            patient's level of consciousness and physiological                            status; initial 15 minutes of intraservice time,                            patient age 57 years or older                           [phone], Moderate sedation services; each additional                            15 minutes intraservice time                           [phone], Moderate sedation services; each additional                            15 minutes intraservice time Diagnosis Code(s):        --- Professional ---                           K64.8, Other hemorrhoids                           D12.4, Benign neoplasm of descending colon                           D12.2, Benign neoplasm of ascending colon                           K52.9, Noninfective gastroenteritis and colitis,                            unspecified                           K57.30, Diverticulosis of large intestine without                            perforation or abscess without bleeding CPT copyright 2016 American Medical Association. All rights reserved. The codes documented in this report are preliminary and upon coder review may  be revised to meet current compliance requirements. Cristopher Estimable.  Josiyah Tozzi, MD Norvel Richards, MD 10/24/2016 11:00:20 AM This report has been signed electronically. Number of Addenda: 0

## 2016-10-24 NOTE — Discharge Instructions (Addendum)
Colonoscopy Discharge Instructions  Read the instructions outlined below and refer to this sheet in the next few weeks. These discharge instructions provide you with general information on caring for yourself after you leave the hospital. Your doctor may also give you specific instructions. While your treatment has been planned according to the most current medical practices available, unavoidable complications occasionally occur. If you have any problems or questions after discharge, call Dr. Gala Romney at [phone]. ACTIVITY  You may resume your regular activity, but move at a slower pace for the next 24 hours.   Take frequent rest periods for the next 24 hours.   Walking will help get rid of the air and reduce the bloated feeling in your belly (abdomen).   No driving for 24 hours (because of the medicine (anesthesia) used during the test).    Do not sign any important legal documents or operate any machinery for 24 hours (because of the anesthesia used during the test).  NUTRITION  Drink plenty of fluids.   You may resume your normal diet as instructed by your doctor.   Begin with a light meal and progress to your normal diet. Heavy or fried foods are harder to digest and may make you feel sick to your stomach (nauseated).   Avoid alcoholic beverages for 24 hours or as instructed.  MEDICATIONS  You may resume your normal medications unless your doctor tells you otherwise.  WHAT YOU CAN EXPECT TODAY  Some feelings of bloating in the abdomen.   Passage of more gas than usual.   Spotting of blood in your stool or on the toilet paper.  IF YOU HAD POLYPS REMOVED DURING THE COLONOSCOPY:  No aspirin products for 7 days or as instructed.   No alcohol for 7 days or as instructed.   Eat a soft diet for the next 24 hours.  FINDING OUT THE RESULTS OF YOUR TEST Not all test results are available during your visit. If your test results are not back during the visit, make an appointment  with your caregiver to find out the results. Do not assume everything is normal if you have not heard from your caregiver or the medical facility. It is important for you to follow up on all of your test results.  SEEK IMMEDIATE MEDICAL ATTENTION IF:  You have more than a spotting of blood in your stool.   Your belly is swollen (abdominal distention).   You are nauseated or vomiting.   You have a temperature over 101.   You have abdominal pain or discomfort that is severe or gets worse throughout the day.    Colon polyp and diverticulosis information provided  Further recommendations to follow once I get all the lab results back next week.     Colon Polyps Polyps are tissue growths inside the body. Polyps can grow in many places, including the large intestine (colon). A polyp may be a round bump or a mushroom-shaped growth. You could have one polyp or several. Most colon polyps are noncancerous (benign). However, some colon polyps can become cancerous over time. What are the causes? The exact cause of colon polyps is not known. What increases the risk? This condition is more likely to develop in people who:  Have a family history of colon cancer or colon polyps.  Are older than 73 or older than 45 if they are African American.  Have inflammatory bowel disease, such as ulcerative colitis or Crohn disease.  Are overweight.  Smoke cigarettes.  Do  not get enough exercise.  Drink too much alcohol.  Eat a diet that is: ? High in fat and red meat. ? Low in fiber.  Had childhood cancer that was treated with abdominal radiation.  What are the signs or symptoms? Most polyps do not cause symptoms. If you have symptoms, they may include:  Blood coming from your rectum when having a bowel movement.  Blood in your stool.The stool may look dark red or black.  A change in bowel habits, such as constipation or diarrhea.  How is this diagnosed? This condition is diagnosed  with a colonoscopy. This is a procedure that uses a lighted, flexible scope to look at the inside of your colon. How is this treated? Treatment for this condition involves removing any polyps that are found. Those polyps will then be tested for cancer. If cancer is found, your health care provider will talk to you about options for colon cancer treatment. Follow these instructions at home: Diet  Eat plenty of fiber, such as fruits, vegetables, and whole grains.  Eat foods that are high in calcium and vitamin D, such as milk, cheese, yogurt, eggs, liver, fish, and broccoli.  Limit foods high in fat, red meats, and processed meats, such as hot dogs, sausage, bacon, and lunch meats.  Maintain a healthy weight, or lose weight if recommended by your health care provider. General instructions  Do not smoke cigarettes.  Do not drink alcohol excessively.  Keep all follow-up visits as told by your health care provider. This is important. This includes keeping regularly scheduled colonoscopies. Talk to your health care provider about when you need a colonoscopy.  Exercise every day or as told by your health care provider. Contact a health care provider if:  You have new or worsening bleeding during a bowel movement.  You have new or increased blood in your stool.  You have a change in bowel habits.  You unexpectedly lose weight. This information is not intended to replace advice given to you by your health care provider. Make sure you discuss any questions you have with your health care provider. Document Released: 11/07/2003 Document Revised: 07/19/2015 Document Reviewed: 01/01/2015 Elsevier Interactive Patient Education  [employer].    Diverticulosis Diverticulosis is a condition that develops when small pouches (diverticula) form in the wall of the large intestine (colon). The colon is where water is absorbed and stool is formed. The pouches form when the inside layer of the  colon pushes through weak spots in the outer layers of the colon. You may have a few pouches or many of them. What are the causes? The cause of this condition is not known. What increases the risk? The following factors may make you more likely to develop this condition:  Being older than age 54. Your risk for this condition increases with age. Diverticulosis is rare among people younger than age 45. By age 75, many people have it.  Eating a low-fiber diet.  Having frequent constipation.  Being overweight.  Not getting enough exercise.  Smoking.  Taking over-the-counter pain medicines, like aspirin and ibuprofen.  Having a family history of diverticulosis.  What are the signs or symptoms? In most people, there are no symptoms of this condition. If you do have symptoms, they may include:  Bloating.  Cramps in the abdomen.  Constipation or diarrhea.  Pain in the lower left side of the abdomen.  How is this diagnosed? This condition is most often diagnosed during an exam for  other colon problems. Because diverticulosis usually has no symptoms, it often cannot be diagnosed independently. This condition may be diagnosed by:  Using a flexible scope to examine the colon (colonoscopy).  Taking an X-ray of the colon after dye has been put into the colon (barium enema).  Doing a CT scan.  How is this treated? You may not need treatment for this condition if you have never developed an infection related to diverticulosis. If you have had an infection before, treatment may include:  Eating a high-fiber diet. This may include eating more fruits, vegetables, and grains.  Taking a fiber supplement.  Taking a live bacteria supplement (probiotic).  Taking medicine to relax your colon.  Taking antibiotic medicines.  Follow these instructions at home:  Drink 6-8 glasses of water or more each day to prevent constipation.  Try not to strain when you have a bowel movement.  If  you have had an infection before: ? Eat more fiber as directed by your health care provider or your diet and nutrition specialist (dietitian). ? Take a fiber supplement or probiotic, if your health care provider approves.  Take over-the-counter and prescription medicines only as told by your health care provider.  If you were prescribed an antibiotic, take it as told by your health care provider. Do not stop taking the antibiotic even if you start to feel better.  Keep all follow-up visits as told by your health care provider. This is important. Contact a health care provider if:  You have pain in your abdomen.  You have bloating.  You have cramps.  You have not had a bowel movement in 3 days. Get help right away if:  Your pain gets worse.  Your bloating becomes very bad.  You have a fever or chills, and your symptoms suddenly get worse.  You vomit.  You have bowel movements that are bloody or black.  You have bleeding from your rectum. Summary  Diverticulosis is a condition that develops when small pouches (diverticula) form in the wall of the large intestine (colon).  You may have a few pouches or many of them.  This condition is most often diagnosed during an exam for other colon problems.  If you have had an infection related to diverticulosis, treatment may include increasing the fiber in your diet, taking supplements, or taking medicines. This information is not intended to replace advice given to you by your health care provider. Make sure you discuss any questions you have with your health care provider. Document Released: 11/08/2003 Document Revised: 12/31/2015 Document Reviewed: 12/31/2015 Elsevier Interactive Patient Education  2017 [employer].

## 2016-10-25 LAB — GASTROINTESTINAL PANEL BY PCR, STOOL (REPLACES STOOL CULTURE)

## 2016-10-29 ENCOUNTER — Encounter: Payer: Self-pay | Admitting: Internal Medicine

## 2016-10-29 ENCOUNTER — Telehealth: Payer: Self-pay | Admitting: Internal Medicine

## 2016-10-29 ENCOUNTER — Telehealth: Payer: Self-pay

## 2016-10-29 ENCOUNTER — Encounter (HOSPITAL_COMMUNITY): Payer: Self-pay | Admitting: Internal Medicine

## 2016-10-29 NOTE — Telephone Encounter (Signed)
Letter mailed to the pt. 

## 2016-10-29 NOTE — Telephone Encounter (Signed)
Pt's wife called because she seen on Mychart that patient was scheduled to see EG on Friday 9/14. I told her that [facility]MR wanted to bring him in the office in 1-2 weeks to seen an extender. She said that they are hanging in limbo and doesn't know what is going on. Pt recently had colonoscopy and they are anxious for the results. I told her the nurse was not available, but I would put a message in for her to call her back. She agreed. [phone]

## 2016-10-29 NOTE — Telephone Encounter (Signed)
Imodium for diarrhea

## 2016-10-29 NOTE — Telephone Encounter (Signed)
Spoke with the pts wife, went over the results letter with her and explained all of the findings. She stated she understood everything and is aware of appt next week with EG.   pts wife wants to know if there is any medication he can take to help him until his ov?

## 2016-10-29 NOTE — Telephone Encounter (Signed)
OV made and wife is aware. She would like for the nurse to call her.

## 2016-10-29 NOTE — Telephone Encounter (Signed)
Per RMR-  Rourk, Cristopher Estimable, MD  Claudina Lick, LPN; Theadora Rama        Send letter to patient.  Send copy of letter with path to referring provider and PCP. Patient having a tough time with diarrhea last week. Spouse frustrated.   We need to get him back to the office in the next 1-2 weeks to see extender semi-urgently unless things change since last week.

## 2016-10-30 ENCOUNTER — Other Ambulatory Visit: Payer: Self-pay | Admitting: Gastroenterology

## 2016-10-30 ENCOUNTER — Other Ambulatory Visit: Payer: Self-pay

## 2016-10-30 DIAGNOSIS — R197 Diarrhea, unspecified: Secondary | ICD-10-CM

## 2016-10-30 NOTE — Telephone Encounter (Signed)
Juan Quinn:  In interim, let's check IgA and TTg, IgA, and TSH.   May trial Creon empirically. Not sure this would help or not. He has not improved with Bentyl. Creon 36,000 units, take 2 with meals and 1 with snacks. Can pick up samples.   Routing to [employer], as he will be seeing him next week.

## 2016-10-30 NOTE — Telephone Encounter (Signed)
Noted  

## 2016-10-30 NOTE — Telephone Encounter (Signed)
Update: still check labs.  If Imodium has provided relief before, do that. Otherwise, provide Creon samples.

## 2016-10-30 NOTE — Telephone Encounter (Signed)
pts wife- Juan Quinn is aware. Lab order done and samples with instructions are at the front desk.

## 2016-10-30 NOTE — Telephone Encounter (Signed)
pts wife- Joaquim Lai is aware.

## 2016-10-31 LAB — TISSUE TRANSGLUTAMINASE, IGA: (TTG) AB, IGA: 1 U/mL

## 2016-10-31 LAB — IGA: Immunoglobulin A: 170 mg/dL (ref 81–463)

## 2016-10-31 LAB — TSH: TSH: 1.93 mIU/L (ref 0.40–4.50)

## 2016-11-03 ENCOUNTER — Ambulatory Visit (INDEPENDENT_AMBULATORY_CARE_PROVIDER_SITE_OTHER): Payer: Medicare HMO | Admitting: Otolaryngology

## 2016-11-03 ENCOUNTER — Telehealth: Payer: Self-pay | Admitting: *Deleted

## 2016-11-03 ENCOUNTER — Encounter: Payer: Self-pay | Admitting: Internal Medicine

## 2016-11-03 DIAGNOSIS — H6123 Impacted cerumen, bilateral: Secondary | ICD-10-CM | POA: Diagnosis not present

## 2016-11-03 NOTE — Progress Notes (Signed)
[name]: your celiac serologies were normal. Your thyroid number (TSH) is normal. How are you doing?

## 2016-11-03 NOTE — Telephone Encounter (Signed)
Done

## 2016-11-03 NOTE — Telephone Encounter (Signed)
Juan Quinn, please send the patients results in mychart.

## 2016-11-03 NOTE — Telephone Encounter (Signed)
PATIENT CALLED CONCERNED THAT SHE GOT A NOTIFICATION FROM MY CHART AND THERE WERE NO RESULTS THERE, CONCERNED THE RESULTS TO SOMETHING WERE LOST.

## 2016-11-04 ENCOUNTER — Encounter: Payer: Self-pay | Admitting: Gastroenterology

## 2016-11-04 ENCOUNTER — Other Ambulatory Visit: Payer: Self-pay | Admitting: Physician Assistant

## 2016-11-06 ENCOUNTER — Encounter: Payer: Self-pay | Admitting: Gastroenterology

## 2016-11-06 ENCOUNTER — Other Ambulatory Visit: Payer: Self-pay

## 2016-11-06 ENCOUNTER — Ambulatory Visit (INDEPENDENT_AMBULATORY_CARE_PROVIDER_SITE_OTHER): Payer: Medicare HMO | Admitting: Gastroenterology

## 2016-11-06 VITALS — BP 121/60 | HR 71 | Temp 97.7°F | Ht 69.0 in | Wt 204.6 lb

## 2016-11-06 DIAGNOSIS — R197 Diarrhea, unspecified: Secondary | ICD-10-CM

## 2016-11-06 DIAGNOSIS — R634 Abnormal weight loss: Secondary | ICD-10-CM

## 2016-11-06 DIAGNOSIS — K529 Noninfective gastroenteritis and colitis, unspecified: Secondary | ICD-10-CM

## 2016-11-06 MED ORDER — PANCRELIPASE (LIP-PROT-AMYL) 36000-114000 UNITS PO CPEP: ORAL_CAPSULE | ORAL | 3 refills | Status: DC

## 2016-11-06 NOTE — Assessment & Plan Note (Signed)
83 year old gentleman with partially seven-week history of diarrhea. Interestingly his wife had 24-hour GI bug less than 2 days before patient became ill. She quickly got over her illness. He has had persistent diarrhea although less frequent. His abdominal pain is improved. Energy level and appetite slowly improving. They're frustrated that they don't know what has caused this. He will feel better. He plans to travel again in 2 months. Some improvement since starting Creon last week. Tried to provide reassurance. Multiple things have been ruled out including colon cancer, IBD, microscopic colitis, multiple different types of infection, celiac disease. Cannot exclude pancreatic insufficiency, postinfectious IBS, other etiology. Plan for 24-hour urinary 5 HIAA to evaluate for carcinoid. We'll increase his Creon to 3 capsules before meals and 2 before snacks. If no improvement and no answers, may consider treatment for bacterial overgrowth with the Xifaxan or Augmentin. Could consider 24-hour stool collection for osmotic gap. They will keep me posted on his symptoms.   Will not rule out possibility of getting a good IV contrast CT if ongoing diarrhea and weight loss.

## 2016-11-06 NOTE — Progress Notes (Signed)
Primary Care Physician: Lucia Gaskins, MD  Primary Gastroenterologist:  Garfield Cornea, MD   Chief Complaint  Patient presents with  . Diarrhea  . Gas    HPI: Juan Quinn is a 83 y.o. male here for follow-up of diarrhea. Initially seen 10/07/16. His workup thus far has included negative GI pathogen panel, negative C. difficile quick screen, negative stool culture, neg giardia ag. Neg cdiff by pcp. TSH normal. Celiac screen negative. Empirically given Flagyl for 2 weeks and Cipro for 1 week by PCP with no noted improvement.  CT and pelvis without contrast by PCP on 10/09/2016 showed 3.3 cm distal infrarenal aortic aneurysm requiring three-year follow-up ultrasound but otherwise nothing to explain his symptoms.  Colonoscopy showed diverticulosis, internal hemorrhoids, 3 polyps removed one from a ascending colon showed tubulovillous adenoma, random colon biopsies negative for microscopic colitis.  Weight is down from 209 to 204.6 since 10/24/16. Reported 10 pound weight loss prior to seeing him on August 31, total of 14 pounds now.  Symptoms have been associated with significant fatigue, abdominal cramping, gas. Diminished appetite.  Has tried lomotil. No longer on that. Has tried bentyl minimal improvement. Currently on creon 36,000 two with meals and 1 with snacks, started approximately 1 week ago. Imodium 2 mg every morning.  Patient states he does not have any solid stool, all are [place] 7. Frequency has improved, only 2-3 stools a day, previously up to 5 or 6. Less abdominal discomfort. Slightly improved appetite and energy level. Still quite frustrated. Patient reports that he and his wife both got sick within 48 hours of each other, diarrheal illness when this all started. She got better and he didn't. Last Travel was back in the spring. Denies any new medications prior to developing this illness Has been on restora for about a month, waiting for his second order.   Current  Outpatient Prescriptions  Medication Sig Dispense Refill  . allopurinol (ZYLOPRIM) 300 MG tablet Take 300 mg by mouth daily.    Marland Kitchen amLODipine (NORVASC) 5 MG tablet Take 5 mg by mouth daily.  3  . Cholecalciferol (VITAMIN D3) 1000 UNITS CAPS Take 1 capsule by mouth daily.      . fish oil-omega-3 fatty acids 1000 MG capsule Take 1 g by mouth daily.      Marland Kitchen ipratropium (ATROVENT) 0.06 % nasal spray Place 2 sprays into both nostrils daily as needed for rhinitis.     Marland Kitchen LORazepam (ATIVAN) 1 MG tablet Take 0.5 mg by mouth at bedtime as needed (restless leg).   0  . Multiple Vitamin (MULTIVITAMIN WITH MINERALS) TABS tablet Take 1 tablet by mouth daily.    . Pancrelipase, Lip-Prot-Amyl, (CREON PO) Take by mouth. 2 capsule before meals and 1 before snacks    . Polyethyl Glycol-Propyl Glycol (SYSTANE OP) Apply 1 drop to eye 4 (four) times daily as needed (dry eyes).    . simvastatin (ZOCOR) 40 MG tablet Take 40 mg by mouth daily.     Marland Kitchen terazosin (HYTRIN) 5 MG capsule Take 5 mg by mouth daily.     . vitamin B-12 (CYANOCOBALAMIN) 1000 MCG tablet Take 1,000 mcg by mouth daily.     No current facility-administered medications for this visit.     Allergies as of 11/06/2016  . (No Known Allergies)   Past Medical History:  Diagnosis Date  . Adenomatous colon polyp 2000   Surveillance due 2017  . Helicobacter pylori antibody positive 2015   treatment with  Prevpac  . HTN (hypertension)   . Hypercholesteremia   . Squamous cell carcinoma    Past Surgical History:  Procedure Laterality Date  . BIOPSY  10/24/2016   Procedure: BIOPSY;  Surgeon: Daneil Dolin, MD;  [facility];  Service: Endoscopy;;  colon  . COLONOSCOPY  June 2012   Dr. Gala Romney: adenomatous polyps, due for surveillance if health permits in 2025  . COLONOSCOPY N/A 10/24/2016   Procedure: COLONOSCOPY;  Surgeon: Daneil Dolin, MD;  [facility];  Service: Endoscopy;  Laterality: N/A;  2:00pm  .  ESOPHAGOGASTRODUODENOSCOPY N/A 05/24/2013   Dr. Rourk:Prepyloric gastric ulcerations-likely source of hematemesis-likely NSAID-related. H.PYLORI SEROLOGY POSITIVE, TREATED WITH PREVPAC  . ESOPHAGOGASTRODUODENOSCOPY N/A 05/23/2013   [facility]:[id number] EGD secondary to much clot and food debris in the stomach  . ESOPHAGOGASTRODUODENOSCOPY N/A 08/16/2013   Dr. Gala Romney: normal esophagus, stomach empty, deformity of antrum. scar present. Previously noted ulcers completed healed. Patent pylorus, normal D1 and D2  . HEMORRHOID SURGERY    . POLYPECTOMY  10/24/2016   Procedure: POLYPECTOMY;  Surgeon: Daneil Dolin, MD;  [facility];  Service: Endoscopy;;  colon  . shoulder      ROS:  General: Negative for anorexia, fever, chills, weakness.See history of present illness ENT: Negative for hoarseness, difficulty swallowing , nasal congestion. CV: Negative for chest pain, angina, palpitations, dyspnea on exertion, peripheral edema.  Respiratory: Negative for dyspnea at rest, dyspnea on exertion, cough, sputum, wheezing.  GI: See history of present illness. GU:  Negative for dysuria, hematuria, urinary incontinence, urinary frequency, nocturnal urination.  Endo: See history of present illness    Physical Examination:   BP 121/60   Pulse 71   Temp 97.7 F (36.5 C) (Oral)   Ht 5\' 9"  (1.753 m)   Wt 204 lb 9.6 oz (92.8 kg)   BMI 30.21 kg/m   General: Well-nourished, well-developed in no acute distress. Accompanied by wife. Patient somewhat hard of hearing. Eyes: No icterus. Mouth: Oropharyngeal mucosa moist and pink , no lesions erythema or exudate. Lungs: Clear to auscultation bilaterally.  Heart: Regular rate and rhythm, no murmurs rubs or gallops.  Abdomen: Bowel sounds are normal, nontender, nondistended, no hepatosplenomegaly or masses, no abdominal bruits or hernia , no rebound or guarding.   Extremities: No lower extremity edema. No clubbing or deformities. Neuro: Alert and oriented  x 4   Skin: Warm and dry, no jaundice.   Psych: Alert and cooperative, normal mood and affect.    Imaging Studies: Ct Abdomen Pelvis Wo Contrast  Result Date: 10/09/2016 CLINICAL DATA:  Lower abdominal pain and diarrhea EXAM: CT ABDOMEN AND PELVIS WITHOUT CONTRAST TECHNIQUE: Multidetector CT imaging of the abdomen and pelvis was performed following the standard protocol without IV contrast. COMPARISON:  None. FINDINGS: Lower chest: Lung bases demonstrate no acute consolidation or pleural effusion. Nonenlarged heart. Coronary artery calcification. Hepatobiliary: No focal liver abnormality is seen. No gallstones, gallbladder wall thickening, or biliary dilatation. Pancreas: Unremarkable. No pancreatic ductal dilatation or surrounding inflammatory changes. Spleen: Normal in size without focal abnormality. Adrenals/Urinary Tract: Adrenal glands are within normal limits. Perinephric fat stranding. Low-density lesions in the kidneys, probably cysts but incompletely evaluated without contrast. Negative for hydronephrosis. Intrarenal vascular calcifications. Bladder unremarkable. Stomach/Bowel: Stomach is within normal limits. Appendix appears normal. No evidence of bowel wall thickening, distention, or inflammatory changes. Scattered colon diverticula without acute wall thickening Vascular/Lymphatic: Moderate aortic atherosclerotic calcification. Mild aneurysmal dilatation of the infrarenal abdominal aorta distally, measuring 3.3  cm in maximum diameter. No significantly enlarged lymph nodes Reproductive: Prostate is unremarkable. Other: Fat in the inguinal canals. Negative for free air or free fluid. Fat in the umbilicus. Musculoskeletal: Degenerative changes. No acute or suspicious bone lesion IMPRESSION: 1. No CT evidence for acute intra-abdominal or pelvic pathology. Negative for bowel wall thickening or obstruction 2. Low-density lesions in both kidneys, probably cysts but incompletely evaluated without  contrast 3. Few colon diverticula without acute inflammation 4. 3.3 cm distal infrarenal aortic aneurysm. Recommend followup by ultrasound in 3 years. This recommendation follows ACR consensus guidelines: White Paper of the ACR Incidental Findings Committee II on Vascular Findings. Natasha Mead Coll Radiol 2013; 10:789-794 Electronically Signed   By: Donavan Foil M.D.   On: 10/09/2016 03:45

## 2016-11-06 NOTE — Patient Instructions (Signed)
1. Please increase creon to 3 capsules with meals and 2 capsules with snacks. RX sent to pharmacy and samples provided.  2. Start Restora once daily. Samples provided. Take at least for four weeks.  3. Collect urine for 24 hours to check for carcinoid. Please avoid foods on the list for at least 4 days before you start urine collection.

## 2016-11-07 ENCOUNTER — Ambulatory Visit: Payer: Medicare HMO | Admitting: Nurse Practitioner

## 2016-11-10 ENCOUNTER — Encounter: Payer: Self-pay | Admitting: Gastroenterology

## 2016-11-10 NOTE — Progress Notes (Signed)
cc'ed to pcp °

## 2016-11-15 LAB — 5 HIAA, QUANTITATIVE, URINE, 24 HOUR
5-HIAA, Ur: 2.4 mg/L
5-HIAA,QUANT.,24 HR URINE: 3.4 mg/(24.h) (ref 0.0–14.9)

## 2016-11-17 ENCOUNTER — Telehealth: Payer: Self-pay

## 2016-11-17 NOTE — Progress Notes (Signed)
Urine test negative.  If he is still having diarrhea I want to try one more thing prior to considering further diagnostic testing.  Give him Xifaxan 550mg  tid for 14 days. May give samples.

## 2016-11-17 NOTE — Telephone Encounter (Signed)
Pt's wife is calling to get the results from the urine test. Please advise

## 2016-11-18 ENCOUNTER — Encounter: Payer: Self-pay | Admitting: Gastroenterology

## 2016-11-18 ENCOUNTER — Telehealth: Payer: Self-pay | Admitting: Internal Medicine

## 2016-11-18 NOTE — Telephone Encounter (Signed)
I spoke with the pts wife, explained to her that LSL wanted him to try the xifaxan and see if it helps. He should continue taking his current medications. I have left the samples at the front desk with instructions on how to take them. Routing to LSL for [employer]

## 2016-11-18 NOTE — Telephone Encounter (Signed)
See result note.  

## 2016-11-18 NOTE — Telephone Encounter (Signed)
Patient's wife called wanting to make OV for today. I told her I could schedule him in the next available. She said that she wanted "to get to the bottom of this" because he has had diarrhea for 2 months and is losing weight. Call was transferred to [place] who then spoke with patient's wife.

## 2016-11-20 ENCOUNTER — Encounter: Payer: Self-pay | Admitting: Gastroenterology

## 2016-11-21 ENCOUNTER — Telehealth: Payer: Self-pay | Admitting: Internal Medicine

## 2016-11-21 ENCOUNTER — Other Ambulatory Visit: Payer: Self-pay

## 2016-11-21 ENCOUNTER — Telehealth: Payer: Self-pay | Admitting: Gastroenterology

## 2016-11-21 DIAGNOSIS — R197 Diarrhea, unspecified: Secondary | ICD-10-CM

## 2016-11-21 LAB — CBC WITH DIFFERENTIAL/PLATELET
BASOS ABS: 21 {cells}/uL (ref 0–200)
BASOS PCT: 0.3 %
EOS ABS: 311 {cells}/uL (ref 15–500)
Eosinophils Relative: 4.5 %
HEMATOCRIT: 39.4 % (ref 38.5–50.0)
HEMOGLOBIN: 13.8 g/dL (ref 13.2–17.1)
LYMPHS ABS: 2401 {cells}/uL (ref 850–3900)
MCH: 34.9 pg — ABNORMAL HIGH (ref 27.0–33.0)
MCHC: 35 g/dL (ref 32.0–36.0)
MCV: 99.7 fL (ref 80.0–100.0)
MONOS PCT: 9.1 %
MPV: 10.2 fL (ref 7.5–12.5)
NEUTROS ABS: 3540 {cells}/uL (ref 1500–7800)
Neutrophils Relative %: 51.3 %
Platelets: 138 10*3/uL — ABNORMAL LOW (ref 140–400)
RBC: 3.95 10*6/uL — ABNORMAL LOW (ref 4.20–5.80)
RDW: 12.7 % (ref 11.0–15.0)
Total Lymphocyte: 34.8 %
WBC mixed population: 628 cells/uL (ref 200–950)
WBC: 6.9 10*3/uL (ref 3.8–10.8)

## 2016-11-21 LAB — COMPREHENSIVE METABOLIC PANEL
AG RATIO: 1.9 (calc) (ref 1.0–2.5)
ALT: 9 U/L (ref 9–46)
AST: 13 U/L (ref 10–35)
Albumin: 3.7 g/dL (ref 3.6–5.1)
Alkaline phosphatase (APISO): 57 U/L (ref 40–115)
BILIRUBIN TOTAL: 0.6 mg/dL (ref 0.2–1.2)
BUN: 14 mg/dL (ref 7–25)
CALCIUM: 9 mg/dL (ref 8.6–10.3)
CHLORIDE: 104 mmol/L (ref 98–110)
CO2: 29 mmol/L (ref 20–32)
Creat: 0.8 mg/dL (ref 0.70–1.11)
GLOBULIN: 2 g/dL (ref 1.9–3.7)
GLUCOSE: 131 mg/dL (ref 65–139)
Potassium: 3.9 mmol/L (ref 3.5–5.3)
SODIUM: 140 mmol/L (ref 135–146)
TOTAL PROTEIN: 5.7 g/dL — AB (ref 6.1–8.1)

## 2016-11-21 LAB — LIPASE: Lipase: 13 U/L (ref 7–60)

## 2016-11-21 NOTE — Telephone Encounter (Signed)
Let's simplify things.   If he is on Lomotil or bentyl let's stop.   If he needs antidiarrhea, use imodium 2mg  in morning only.  Prefer to continue Creon because it seems to be the only thing that helped when started.   Continue Xifaxan.   OK to try benadryl 1/2 tablet (12.5mg ) three times a day as needed for itching. Can cause drowsiness that's why I recommend the lower dose. Can buy otc and break in half.    I PREFER TO HAVE HIM DO LABS TO INVESTIGATE THE ITCHING BEFORE HE COMES IN TO SEE RMR. IF AGREEABLE: CMET, LIPASE, CBC WITH DIFF

## 2016-11-21 NOTE — Telephone Encounter (Signed)
Per DS patient is to see RMR next week per LSL. RMR is in the office on Tuesday Oct 2 and there's no openings or URG spots with him. If a cancellation becomes available please put [name] in to see RMR ONLY.

## 2016-11-21 NOTE — Telephone Encounter (Signed)
noted 

## 2016-11-21 NOTE — Telephone Encounter (Signed)
Please contact patient. There have been a couple of extensive patient emails the last couple of days.   We need to find out more about reported itching. Does he have a rash?   Any yellowing of the eyes or skin or other abnormal coloring (ie looks like bronze or suntan)?  What are all of his most recent NEW medications?  Can we get this patient in to see Dr. Gala Romney next week? Wife is extremely concerned and I feel like they need additional input by MD at this point.

## 2016-11-21 NOTE — Telephone Encounter (Signed)
I will tell pt's wife, Joaquim Lai, when I call with Leslie's recommendation.

## 2016-11-21 NOTE — Telephone Encounter (Signed)
Pts wife is aware 

## 2016-11-21 NOTE — Telephone Encounter (Signed)
I can schedule the patient to see RMR 10/2 at 4:30

## 2016-11-21 NOTE — Telephone Encounter (Signed)
Pt's wife, Joaquim Lai, is aware. I went over each thing step by step and she wrote down the information. They will go to the lab today. Orders have been entered and released and faxed. She is aware of the appt with Dr. Gala Romney on 11/25/2016 at 4:30 am. I told her to listen out, sometimes if cancellation, they may call to come in earlier that day. She said if so to call cell number @ [phone].  Anson Crofts the number to call if needed.

## 2016-11-21 NOTE — Telephone Encounter (Signed)
Pt is scheduled to see RMR on Tuesday at 430.

## 2016-11-21 NOTE — Telephone Encounter (Signed)
I called and spoke to pt's wife. She said pt has been itching for 2-3 weeks, he is itching all over but has no rash. He does not have any yellowing of the eyes or skin. She has been reluctant to give Benadryl because of the medications he is on. I have updated his med list. She changed detergents to [employer] detergent, but he was itching before then.  He eats a light breakfast usually oatmeal or grits and boiled egg and peanut butter on toast.  Lunch he just eats a banana and peanut butter cracker. He eats more at dinner and last evening he had Chicken, asparagus, cooked apples, and small baked potato.  He sucks on starlight peppermint mints about 10 a day. His worst time with his bowels is from the time he gets up til noon daily. He has watery stools and gas, sometimes mushy stools.  He usually goes about 3-4 times. Then at night he has so much gas, she said a tremendous amount of gas and after he passes it, he feels better.  Dr. Gala Romney is only here on Tuesday next week. He does not have anything, we can call if cancellation.

## 2016-11-23 NOTE — Progress Notes (Signed)
LABS OK. PLEASE SEE RMR AS SCHEDULED.

## 2016-11-25 ENCOUNTER — Encounter: Payer: Self-pay | Admitting: Internal Medicine

## 2016-11-25 ENCOUNTER — Ambulatory Visit: Payer: Medicare HMO | Admitting: Internal Medicine

## 2016-11-25 ENCOUNTER — Ambulatory Visit (INDEPENDENT_AMBULATORY_CARE_PROVIDER_SITE_OTHER): Payer: Medicare HMO | Admitting: Internal Medicine

## 2016-11-25 VITALS — BP 110/64 | HR 67 | Temp 96.8°F | Ht 69.0 in | Wt 199.8 lb

## 2016-11-25 DIAGNOSIS — R197 Diarrhea, unspecified: Secondary | ICD-10-CM | POA: Diagnosis not present

## 2016-11-25 DIAGNOSIS — R634 Abnormal weight loss: Secondary | ICD-10-CM

## 2016-11-25 MED ORDER — HYOSCYAMINE SULFATE SL 0.125 MG SL SUBL: 0.1250 mg | SUBLINGUAL_TABLET | Freq: Three times a day (TID) | SUBLINGUAL | 3 refills | Status: DC

## 2016-11-25 NOTE — Patient Instructions (Signed)
Schedule a second opinion with GI at [facility] for early November  Stop [place] or similar probiotic such as Electronics engineer, Digestive Advantage or [employer]  Trial of Levsin 0.125 mg tablets - one under the tongue 20 minutes before meals and at bedtime as needed  May continue [place]   Office visit with [place] in 1 month

## 2016-11-25 NOTE — Progress Notes (Signed)
Primary Care Physician:  Lucia Gaskins, MD Primary Gastroenterologist:  Dr.Rourk   Pre-Procedure History & Physical: HPI:  Juan Quinn is a 83 y.o. male here for 2+ month history of abdominal bloating, cramping and nonbloody diarrhea. He has been seen here on multiple occasions. Extensive workup has chronicled in our last office visit. Infectious agent not documented. No evidence of new onset IBD or microscopic colitis recent colonoscopy. Patient states symptoms somewhat improved but not resolved. He remains frustrated. Became ill overnight over the summer. Wife was ill as well she recovered he did not. No one else around the heel. No unusual sick contact tacks or travel. He has lost another 6 pounds. Empiric treatment with pancreatic enzymes has not really made much difference.  Past Medical History:  Diagnosis Date  . Adenomatous colon polyp 2000   Surveillance due 2017  . Helicobacter pylori antibody positive 2015   treatment with Prevpac  . HTN (hypertension)   . Hypercholesteremia   . Squamous cell carcinoma     Past Surgical History:  Procedure Laterality Date  . BIOPSY  10/24/2016   Procedure: BIOPSY;  Surgeon: Daneil Dolin, MD;  [facility];  Service: Endoscopy;;  colon  . COLONOSCOPY  June 2012   Dr. Gala Romney: adenomatous polyps, due for surveillance if health permits in 9833  . COLONOSCOPY N/A 10/24/2016   Procedure: COLONOSCOPY;  Surgeon: Daneil Dolin, MD;  [facility];  Service: Endoscopy;  Laterality: N/A;  2:00pm  . ESOPHAGOGASTRODUODENOSCOPY N/A 05/24/2013   Dr. Rourk:Prepyloric gastric ulcerations-likely source of hematemesis-likely NSAID-related. H.PYLORI SEROLOGY POSITIVE, TREATED WITH PREVPAC  . ESOPHAGOGASTRODUODENOSCOPY N/A 05/23/2013   ASN:[id number] EGD secondary to much clot and food debris in the stomach  . ESOPHAGOGASTRODUODENOSCOPY N/A 08/16/2013   Dr. Gala Romney: normal esophagus, stomach empty, deformity of antrum. scar present.  Previously noted ulcers completed healed. Patent pylorus, normal D1 and D2  . HEMORRHOID SURGERY    . POLYPECTOMY  10/24/2016   Procedure: POLYPECTOMY;  Surgeon: Daneil Dolin, MD;  [facility];  Service: Endoscopy;;  colon  . shoulder      Prior to Admission medications   Medication Sig Start Date End Date Taking? Authorizing Provider  allopurinol (ZYLOPRIM) 300 MG tablet Take 300 mg by mouth daily.   Yes [provider]  amLODipine (NORVASC) 5 MG tablet Take 5 mg by mouth daily. 07/20/16  Yes [provider]  Cholecalciferol (VITAMIN D3) 1000 UNITS CAPS Take 1 capsule by mouth daily.     Yes [provider]  diphenhydrAMINE (BENADRYL) 25 MG tablet Take 12.5 mg by mouth 3 (three) times daily.   Yes [provider]  fish oil-omega-3 fatty acids 1000 MG capsule Take 1 g by mouth daily.     Yes [provider]  ipratropium (ATROVENT) 0.06 % nasal spray Place 2 sprays into both nostrils daily as needed for rhinitis.  04/29/13  Yes [provider]  lipase/protease/amylase (CREON) 36000 UNITS CPEP capsule Take 3 capsules with meals and 2 capsules with snacks. 11/06/16  Yes Mahala Menghini, PA-C  loperamide (IMODIUM) 2 MG capsule Take 2 mg by mouth daily.    Yes [provider]  Multiple Vitamin (MULTIVITAMIN WITH MINERALS) TABS tablet Take 1 tablet by mouth daily.   Yes [provider]  NON FORMULARY OTC Restful Legs  Takes one at bedtime   Yes [provider]  Polyethyl Glycol-Propyl Glycol (SYSTANE OP) Apply 1 drop to eye 4 (four)  times daily as needed (dry eyes).   Yes [provider]  Probiotic Product (RESTORA PO) Take by mouth.   Yes [provider]  rifaximin (XIFAXAN) 550 MG TABS tablet Take 550 mg by mouth 3 (three) times daily.   Yes [provider]  simvastatin (ZOCOR) 40 MG tablet Take 40 mg by mouth daily.    Yes [provider]  terazosin (HYTRIN) 5 MG capsule  Take 5 mg by mouth daily.    Yes [provider]  vitamin B-12 (CYANOCOBALAMIN) 1000 MCG tablet Take 1,000 mcg by mouth daily.   Yes [provider]  dicyclomine (BENTYL) 10 MG capsule Take 10 mg by mouth 4 (four) times daily -  before meals and at bedtime. Taking 3 times a day    [provider]  LORazepam (ATIVAN) 1 MG tablet Take 0.5 mg by mouth at bedtime as needed (restless leg). Pt is out of this. He will have to be seen to get new prescription and has not made appt with PCP. 09/22/16   [provider]    Allergies as of 11/25/2016  . (No Known Allergies)    Family History  Problem Relation Age of Onset  . Stroke Mother   . Stroke Father   . Pancreatic cancer Sister   . Leukemia Brother   . Lung cancer Sister   . Colon cancer Neg Hx     Social History   Social History  . Marital status: Married    Spouse name: N/A  . Number of children: 4  . Years of education: N/A   Occupational History  . retired     Press photographer   Social History Main Topics  . Smoking status: Former Smoker    Packs/day: 2.00    Years: 50.00    Types: Cigarettes    Quit date: 07/28/2000  . Smokeless tobacco: Former Systems developer    Quit date: 02/25/1999  . Alcohol use No     Comment: couple glasses wine/week, occ beer or rmixed drink  . Drug use: No  . Sexual activity: Not on file   Other Topics Concern  . Not on file   Social History Narrative   Lives w/ wife          Review of Systems: See HPI, otherwise negative ROS  Physical Exam: BP 110/64   Pulse 67   Temp (!) 96.8 F (36 C) (Oral)   Ht 5\' 9"  (1.753 m)   Wt 199 lb 12.8 oz (90.6 kg)   BMI 29.51 kg/m  General:   Alert,  , pleasant and cooperative in NAD Neck:  Supple; no masses or thyromegaly. No significant cervical adenopathy. Lungs:  Clear throughout to auscultation.   No wheezes, crackles, or rhonchi. No acute distress. Heart:  Regular rate and rhythm; no murmurs, clicks, rubs,  or gallops. Abdomen:  Non-distended, normal bowel sounds.  Soft and nontender without appreciable mass or hepatosplenomegaly.  Pulses:  Normal pulses noted. Extremities:  Without clubbing or edema.  Impression:   Very pleasant 83 year old gentleman who is been plagued with abdominal cramps bloating and nonbloody diarrhea for about 2 months now. He is been extensively evaluated here. Although he states he is clearly improved he is not nearly back to his baseline. I'm not convinced the pancreatic enzymes have done anything in terms of helping him with his symptoms.   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 Another 5 - pounds of weight loss is bothersome,  however.  Only other person ill was wife. I doubt we're dealing with any oddities such as [place] agent.   Recommendations:  Schedule a second opinion with GI at [facility] for early November  Stop Creon  Continue Restora or similar probiotic such as Electronics engineer, Digestive Advantage or [employer]  Trial of Levsin 0.125 mg tablets - one under the tongue 20 minutes before meals and at bedtime as needed  May continue [place]   Office visit with [place] in 1 month       Notice: This dictation was prepared with Dragon dictation along with smaller phrase technology. Any transcriptional errors that result from this process are unintentional and may not be corrected upon review.

## 2016-11-27 ENCOUNTER — Other Ambulatory Visit: Payer: Self-pay

## 2016-11-27 ENCOUNTER — Telehealth: Payer: Self-pay

## 2016-11-27 DIAGNOSIS — R197 Diarrhea, unspecified: Secondary | ICD-10-CM

## 2016-11-27 NOTE — Telephone Encounter (Signed)
Called [facility] for referral re: diarrhea. 1st available appt 03/03/17 at 1:00pm with Dr. Jerene Pitch. They will mail pt a packet with info. Pt can call to see if they have cancellations. Tried to call pt to inform, no answer, LMOVM.

## 2016-12-01 ENCOUNTER — Other Ambulatory Visit: Payer: Self-pay

## 2016-12-01 MED ORDER — HYOSCYAMINE SULFATE SL 0.125 MG SL SUBL: 0.1250 mg | SUBLINGUAL_TABLET | Freq: Three times a day (TID) | SUBLINGUAL | 3 refills | Status: DC

## 2016-12-09 ENCOUNTER — Encounter: Payer: Self-pay | Admitting: Gastroenterology

## 2016-12-16 ENCOUNTER — Telehealth: Payer: Self-pay

## 2016-12-16 NOTE — Telephone Encounter (Signed)
Called and informed pt's wife that Dr. Gala Romney advised for him to take Hycosamine on as needed basis for abdominal cramps and diarrhea.

## 2016-12-25 ENCOUNTER — Ambulatory Visit (INDEPENDENT_AMBULATORY_CARE_PROVIDER_SITE_OTHER): Payer: Medicare HMO | Admitting: Gastroenterology

## 2016-12-25 ENCOUNTER — Telehealth: Payer: Self-pay | Admitting: *Deleted

## 2016-12-25 ENCOUNTER — Encounter: Payer: Self-pay | Admitting: Gastroenterology

## 2016-12-25 ENCOUNTER — Other Ambulatory Visit: Payer: Self-pay | Admitting: *Deleted

## 2016-12-25 ENCOUNTER — Encounter: Payer: Self-pay | Admitting: *Deleted

## 2016-12-25 VITALS — BP 121/72 | HR 74 | Temp 97.1°F | Ht 69.0 in | Wt 199.2 lb

## 2016-12-25 DIAGNOSIS — R197 Diarrhea, unspecified: Secondary | ICD-10-CM

## 2016-12-25 DIAGNOSIS — R1031 Right lower quadrant pain: Secondary | ICD-10-CM | POA: Diagnosis not present

## 2016-12-25 DIAGNOSIS — R109 Unspecified abdominal pain: Secondary | ICD-10-CM

## 2016-12-25 DIAGNOSIS — R634 Abnormal weight loss: Secondary | ICD-10-CM

## 2016-12-25 DIAGNOSIS — R1032 Left lower quadrant pain: Secondary | ICD-10-CM | POA: Diagnosis not present

## 2016-12-25 LAB — BUN/CREATININE RATIO
BUN: 15 mg/dL (ref 7–25)
Creat: 0.99 mg/dL (ref 0.70–1.11)
GFR, EST AFRICAN AMERICAN: 81 mL/min/{1.73_m2} (ref >=60)
GFR, EST NON AFRICAN AMERICAN: 70 mL/min/{1.73_m2} (ref >=60)

## 2016-12-25 NOTE — Patient Instructions (Signed)
1. Please collect stool and submit to [facility]. 2. CT scan as scheduled.  3. Keep upcoming appointment at [facility].

## 2016-12-25 NOTE — Progress Notes (Signed)
Primary Care Physician: Lucia Gaskins, MD  Primary Gastroenterologist:  Garfield Cornea, MD   Chief Complaint  Patient presents with  . Diarrhea    4-5 times per day  . belching  . Gas    HPI: Juan Quinn is a 83 y.o. male here for one-month follow-up of persistent abdominal bloating, cramping, nonbloody diarrhea, weight loss.  Last seen on November 25, 2016.  Referral to [facility] for second opinion.  Patient became ill over the summer (July 23), wife was ill as well but she recovered and he did not.  No one else has been ill around them.  Initially seen for symptoms around August.  Workup included negative GI pathogen panel, negative C. difficile quick screen, negative stool culture, negative Giardia antigen.  Negative C. difficile by PCP as well.  TSH was normal.  Celiac screen was negative.  He was empirically given Flagyl for 2 weeks and Cipro for 1 week by PCP with no noted improvement of his diarrhea.  PCP ordered a CT of abdomen and pelvis without contrast on October 09, 2016 which showed 3.3 cm distal infrarenal aortic aneurysm requiring three-year follow-up ultrasound but otherwise nothing to explain his symptoms.  Colonoscopy showed diverticulosis, internal hemorrhoids, 3 polyps removed one from the ascending colon showed tubulovillous adenoma, random colon biopsies negative for microscopic colitis.  Patient reports no improvement of his symptoms on Bentyl, Creon, Lomotil, Imodium, Xifaxan, hyoscyamine.  24-hour urine 5 HIAA was normal.  He reports every 15 pound weight loss since his symptoms began.  Initially all of the stools were [place] 7, up to 5 or 6 a day.  Severe fatigue.  His stools have slightly improved, still having multiple per day 3-5.  Still having nocturnal diarrhea.  At times stools are between [facility] 4-5.  He has noted increased flatulence, belching, gas pains.  Approximately 10 days ago he had a period of about a week where  he passed a small amount of stool each day but for the last few days he has had much more output.  He feels like his appetite has improved and is eating better.  He notes that he stopped all medications provided for his diarrhea except for probiotics.  He has an appointment at [facility] November 20.   Current Outpatient Prescriptions  Medication Sig Dispense Refill  . allopurinol (ZYLOPRIM) 300 MG tablet Take 300 mg by mouth daily.    Marland Kitchen amLODipine (NORVASC) 5 MG tablet Take 5 mg by mouth daily.  3  . Cholecalciferol (VITAMIN D3) 1000 UNITS CAPS Take 1 capsule by mouth daily.      . fish oil-omega-3 fatty acids 1000 MG capsule Take 1 g by mouth daily.      Marland Kitchen ipratropium (ATROVENT) 0.06 % nasal spray Place 2 sprays into both nostrils daily as needed for rhinitis.     . Multiple Vitamin (MULTIVITAMIN WITH MINERALS) TABS tablet Take 1 tablet by mouth daily.    . NON FORMULARY OTC Restful Legs  Takes one at bedtime    . Polyethyl Glycol-Propyl Glycol (SYSTANE OP) Apply 1 drop to eye 4 (four) times daily as needed (dry eyes).    . Probiotic Product (RESTORA PO) Take by mouth.    . Simethicone (GAS RELIEF PO) Take 1 tablet by mouth 2 (two) times daily.    . simvastatin (ZOCOR) 40 MG tablet Take 40 mg by mouth daily.     Marland Kitchen  terazosin (HYTRIN) 5 MG capsule Take 5 mg by mouth daily.     . vitamin B-12 (CYANOCOBALAMIN) 1000 MCG tablet Take 1,000 mcg by mouth daily.     No current facility-administered medications for this visit.     Allergies as of 12/25/2016  . (No Known Allergies)   Past Medical History:  Diagnosis Date  . Adenomatous colon polyp 2000   Surveillance due 2017  . Helicobacter pylori antibody positive 2015   treatment with Prevpac  . HTN (hypertension)   . Hypercholesteremia   . Squamous cell carcinoma    Past Surgical History:  Procedure Laterality Date  . BIOPSY  10/24/2016   Procedure: BIOPSY;  Surgeon: Daneil Dolin, MD;   [facility];  Service: Endoscopy;;  colon  . COLONOSCOPY  June 2012   Dr. Gala Romney: adenomatous polyps, due for surveillance if health permits in 1779  . COLONOSCOPY N/A 10/24/2016   Procedure: COLONOSCOPY;  Surgeon: Daneil Dolin, MD;  [facility];  Service: Endoscopy;  Laterality: N/A;  2:00pm  . ESOPHAGOGASTRODUODENOSCOPY N/A 05/24/2013   Dr. Rourk:Prepyloric gastric ulcerations-likely source of hematemesis-likely NSAID-related. H.PYLORI SEROLOGY POSITIVE, TREATED WITH PREVPAC  . ESOPHAGOGASTRODUODENOSCOPY N/A 05/23/2013   [id number] EGD secondary to much clot and food debris in the stomach  . ESOPHAGOGASTRODUODENOSCOPY N/A 08/16/2013   Dr. Gala Romney: normal esophagus, stomach empty, deformity of antrum. scar present. Previously noted ulcers completed healed. Patent pylorus, normal D1 and D2  . HEMORRHOID SURGERY    . POLYPECTOMY  10/24/2016   Procedure: POLYPECTOMY;  Surgeon: Daneil Dolin, MD;  [facility];  Service: Endoscopy;;  colon  . shoulder      ROS:  General: Negative for anorexia,   fever, chills, fatigue, weakness.++ weight loss ENT: Negative for hoarseness, difficulty swallowing , nasal congestion. CV: Negative for chest pain, angina, palpitations, dyspnea on exertion, peripheral edema.  Respiratory: Negative for dyspnea at rest, dyspnea on exertion, cough, sputum, wheezing.  GI: See history of present illness. GU:  Negative for dysuria, hematuria, urinary incontinence, urinary frequency, nocturnal urination.  Endo: +weight loss.     Physical Examination:   BP 121/72   Pulse 74   Temp (!) 97.1 F (36.2 C) (Oral)   Ht 5\' 9"  (1.753 m)   Wt 199 lb 3.2 oz (90.4 kg)   BMI 29.42 kg/m   General: Well-nourished, well-developed in no acute distress.  Eyes: No icterus. Mouth: Oropharyngeal mucosa moist and pink , no lesions erythema or exudate. Lungs: Clear to auscultation bilaterally.  Heart: Regular rate and rhythm, no murmurs rubs or  gallops.  Abdomen: Bowel sounds are normal, nontender, nondistended, no hepatosplenomegaly or masses, no abdominal bruits or hernia , no rebound or guarding.   Extremities: No lower extremity edema. No clubbing or deformities. Neuro: Alert and oriented x 4   Skin: Warm and dry, no jaundice.   Psych: Alert and cooperative, normal mood and affect.

## 2016-12-25 NOTE — Progress Notes (Signed)
CC'ED TO PCP 

## 2016-12-25 NOTE — Progress Notes (Signed)
Patient has an appointment at [facility] digestive health services on November 20.  Please make sure we have faxed over all records as outlined below.  OV note from November 1, October 2, September 13, August 14. Colonoscopy report from October 24, 2016 and path results CT abdomen pelvis without contrast, CT angios abdomen and pelvis results All labs starting from October 07, 2016 to date.

## 2016-12-25 NOTE — Telephone Encounter (Addendum)
PA for CTA was approved via phone call with [employer]. Case #: [id number]. I spoke with Anderson Malta in clinical review. Authorization # [id number]. Effective date: 12/25/2016. End date 03/25/2017.

## 2016-12-25 NOTE — Assessment & Plan Note (Signed)
83 year old gentleman with several month history of diarrhea, 15 pound weight loss, abdominal discomfort who presents for interim follow-up.  He is scheduled for an appointment on November 20 at [facility] GI group for evaluation.  Patient has had an extensive workup as outlined.  Multiple things have been excluded including colon cancer, IBD, microscopic colitis, celiac disease, C. difficile as well as other infectious etiologies.  24-hour urinary 5-HIAA was normal.  Cannot exclude the possibility of pancreatic insufficiency although he has not responded well to weight-based dosed Creon.  He has empirically been treated with cephalexin for possible IBS or small bowel bacterial overgrowth.  We have been suspicious postinfectious IBS given his wife was sick at the same time but he has been very slow to respond and his weight loss is concerning.  Discussed at length with patient today.  Plan for CTA abdomen/pelvis to rule out mesenteric ischemia, evaluate weight loss, abdominal pain, diarrhea.  Previous CT was without IV contrast.  Also check stool studies to make sure H. pylori was eradicated back in 2015.

## 2016-12-26 ENCOUNTER — Ambulatory Visit (HOSPITAL_COMMUNITY)
Admission: RE | Admit: 2016-12-26 | Discharge: 2016-12-26 | Disposition: A | Discharge status: Home / Self Care | Payer: Medicare HMO | Source: Ambulatory Visit | Attending: Gastroenterology | Admitting: Gastroenterology

## 2016-12-26 ENCOUNTER — Encounter (HOSPITAL_COMMUNITY): Payer: Self-pay

## 2016-12-26 DIAGNOSIS — R634 Abnormal weight loss: Secondary | ICD-10-CM | POA: Diagnosis not present

## 2016-12-26 DIAGNOSIS — R197 Diarrhea, unspecified: Secondary | ICD-10-CM | POA: Diagnosis present

## 2016-12-26 DIAGNOSIS — Z6824 Body mass index (BMI) 24.0-24.9, adult: Secondary | ICD-10-CM | POA: Insufficient documentation

## 2016-12-26 DIAGNOSIS — R935 Abnormal findings on diagnostic imaging of other abdominal regions, including retroperitoneum: Secondary | ICD-10-CM | POA: Diagnosis not present

## 2016-12-26 DIAGNOSIS — I714 Abdominal aortic aneurysm, without rupture: Secondary | ICD-10-CM | POA: Insufficient documentation

## 2016-12-26 MED ORDER — IOPAMIDOL (ISOVUE-370) INJECTION 76%
100.0000 mL | Freq: Once | INTRAVENOUS | Status: AC | PRN
Start: 2016-12-26 — End: 2016-12-26
  Administered 2016-12-26: 100 mL via INTRAVENOUS

## 2016-12-29 LAB — HELICOBACTER PYLORI  SPECIAL ANTIGEN
MICRO NUMBER:: 81234420
SPECIMEN QUALITY: ADEQUATE

## 2016-12-29 NOTE — Progress Notes (Signed)
I have faxed all notes over to [facility].

## 2016-12-30 NOTE — Progress Notes (Signed)
H.pylori NEGATIVE.

## 2016-12-30 NOTE — Progress Notes (Signed)
Please let patient know that he does have some atherosclerosis but his arteries feeding the bowel are patent. He has 3.5cm infrarenal abd aortic aneurysm (f/u with pcp for this). Possible chronic vs recurrent inflammation of one of the little fingerlike appendages on the outside of his lower colon. This is usually self-limiting and does not require treatment. Does not explain his GI symptoms.   Please make sure patient takes copy of his CT A/P from 09/2016 and CTA A/P 12/26/16 to his appt to [facility].

## 2017-02-04 ENCOUNTER — Telehealth: Payer: Self-pay

## 2017-02-04 MED ORDER — DICYCLOMINE HCL 10 MG PO CAPS: 10.0000 mg | ORAL_CAPSULE | Freq: Three times a day (TID) | ORAL | 3 refills | Status: DC

## 2017-02-04 NOTE — Telephone Encounter (Signed)
Done

## 2017-02-04 NOTE — Addendum Note (Signed)
Addended by: Annitta Needs on: 02/04/2017 01:13 PM   Modules accepted: Orders

## 2017-02-04 NOTE — Telephone Encounter (Signed)
Faxed Refill request fro [facility] for Dicyclomine 20 mg tablet, take half a tablet po 4 times a day before meals and at bedtime, #90 day supply. Pts last ov was 12/25/16.

## 2017-03-02 ENCOUNTER — Telehealth: Payer: Self-pay

## 2017-03-02 NOTE — Telephone Encounter (Signed)
Received a refill request from [facility], for  Hyoscyamine 0.125% mg tab, place 1 tab under the tongue 4 times daily before meals and at bedtime. Pt was given RX at 11/25/2016 appointment.

## 2017-03-02 NOTE — Telephone Encounter (Signed)
Pt is currently in [place] and pts pharmacy must have sent request. Pt was taking Bentyl and Levsin. Pt is currently not on either of thoes medications. Pt is taking Questran daily which was given by

## 2017-03-02 NOTE — Telephone Encounter (Signed)
Call pt back, call couldn't be completed.

## 2017-03-02 NOTE — Telephone Encounter (Signed)
Please touch base with patient. He really shouldn't be on both bentyl and levsin at the same time. Let's find out what he has been taking more recently. I also see [facility] put him on Questran.

## 2017-03-03 NOTE — Telephone Encounter (Signed)
OK. No need to refill if he is not taking.

## 2017-03-03 NOTE — Telephone Encounter (Signed)
noted 

## 2017-04-17 ENCOUNTER — Telehealth: Payer: Self-pay

## 2017-04-17 NOTE — Telephone Encounter (Signed)
Pts spouse called, saying that pt continues to have diarrhea which is watery at times and only one soft bowel movement weekly. Pt takes Cholestyramine powder in water with meals up to three times daily. Pt isn't taking probiotics any more because he felt it made his gas worse. Pt has gas a lot which will gives him stomach pain. Pt eats only some meats, potatoes and mainly sweets. pts spouse isn't sure if pts diet is causing him to have the diarrhea and gas.  Pt is in [place] at this time and wonders if he should have lab work to see if his iron, liver, cbc levels are within normal range. Pt spouse also wants to know if pt should continue to take Cholestyramine. Pts spouse was also asked to contact the doctor from [facility] that prescribed that medication to see if they thought pt should try a different medication as well. Pt was given Lomotil for diarrhea and pts spouse feels its doesn't touch the pts diarrhea.   Routing to EG in the Absence of RMR and LSL for FYI

## 2017-07-01 ENCOUNTER — Other Ambulatory Visit: Payer: Self-pay | Admitting: Physician Assistant

## 2017-07-02 ENCOUNTER — Ambulatory Visit (INDEPENDENT_AMBULATORY_CARE_PROVIDER_SITE_OTHER): Payer: Medicare HMO | Admitting: Otolaryngology

## 2017-07-02 DIAGNOSIS — H6121 Impacted cerumen, right ear: Secondary | ICD-10-CM | POA: Diagnosis not present

## 2017-07-13 ENCOUNTER — Telehealth: Payer: Self-pay

## 2017-07-13 NOTE — Telephone Encounter (Signed)
Pt's spouse called asking to know the date samples of Xifaxan 550 mg were given to pt. Per our [place] noted 11/06/17, orders were put in to given pt samples of Xifaxan 550 mg tid x 4 days per LSL. Spouse notified.

## 2017-09-09 ENCOUNTER — Emergency Department (HOSPITAL_COMMUNITY): Payer: Medicare HMO

## 2017-09-09 ENCOUNTER — Encounter (HOSPITAL_COMMUNITY): Payer: Self-pay

## 2017-09-09 ENCOUNTER — Emergency Department (HOSPITAL_COMMUNITY)
Admission: EM | Admit: 2017-09-09 | Discharge: 2017-09-09 | Disposition: A | Discharge status: Home / Self Care | Payer: Medicare HMO | Attending: Emergency Medicine | Admitting: Emergency Medicine

## 2017-09-09 ENCOUNTER — Other Ambulatory Visit: Payer: Self-pay

## 2017-09-09 DIAGNOSIS — Y929 Unspecified place or not applicable: Secondary | ICD-10-CM | POA: Insufficient documentation

## 2017-09-09 DIAGNOSIS — Z23 Encounter for immunization: Secondary | ICD-10-CM | POA: Insufficient documentation

## 2017-09-09 DIAGNOSIS — S00511A Abrasion of lip, initial encounter: Secondary | ICD-10-CM | POA: Insufficient documentation

## 2017-09-09 DIAGNOSIS — W19XXXA Unspecified fall, initial encounter: Secondary | ICD-10-CM

## 2017-09-09 DIAGNOSIS — Z85828 Personal history of other malignant neoplasm of skin: Secondary | ICD-10-CM | POA: Diagnosis not present

## 2017-09-09 DIAGNOSIS — F17210 Nicotine dependence, cigarettes, uncomplicated: Secondary | ICD-10-CM | POA: Insufficient documentation

## 2017-09-09 DIAGNOSIS — T148XXA Other injury of unspecified body region, initial encounter: Secondary | ICD-10-CM

## 2017-09-09 DIAGNOSIS — W1839XA Other fall on same level, initial encounter: Secondary | ICD-10-CM | POA: Diagnosis not present

## 2017-09-09 DIAGNOSIS — I10 Essential (primary) hypertension: Secondary | ICD-10-CM | POA: Insufficient documentation

## 2017-09-09 DIAGNOSIS — R42 Dizziness and giddiness: Secondary | ICD-10-CM | POA: Diagnosis not present

## 2017-09-09 DIAGNOSIS — S61421A Laceration with foreign body of right hand, initial encounter: Secondary | ICD-10-CM | POA: Diagnosis not present

## 2017-09-09 DIAGNOSIS — Y999 Unspecified external cause status: Secondary | ICD-10-CM | POA: Insufficient documentation

## 2017-09-09 DIAGNOSIS — S50311A Abrasion of right elbow, initial encounter: Secondary | ICD-10-CM | POA: Diagnosis not present

## 2017-09-09 DIAGNOSIS — Z79899 Other long term (current) drug therapy: Secondary | ICD-10-CM | POA: Diagnosis not present

## 2017-09-09 DIAGNOSIS — Y9301 Activity, walking, marching and hiking: Secondary | ICD-10-CM | POA: Diagnosis not present

## 2017-09-09 DIAGNOSIS — S0081XA Abrasion of other part of head, initial encounter: Secondary | ICD-10-CM | POA: Insufficient documentation

## 2017-09-09 DIAGNOSIS — S0083XA Contusion of other part of head, initial encounter: Secondary | ICD-10-CM

## 2017-09-09 MED ORDER — CEPHALEXIN 500 MG PO CAPS: 500.0000 mg | ORAL_CAPSULE | Freq: Four times a day (QID) | ORAL | 0 refills | Status: DC

## 2017-09-09 MED ORDER — TETANUS-DIPHTH-ACELL PERTUSSIS 5-2.5-18.5 LF-MCG/0.5 IM SUSP
0.5000 mL | Freq: Once | INTRAMUSCULAR | Status: AC
  Administered 2017-09-09: 0.5 mL via INTRAMUSCULAR
  Filled 2017-09-09: qty 0.5

## 2017-09-09 MED ORDER — LIDOCAINE HCL (PF) 2 % IJ SOLN
INTRAMUSCULAR | Status: AC
  Administered 2017-09-09: 16:00:00
  Filled 2017-09-09: qty 20

## 2017-09-09 MED ORDER — POVIDONE-IODINE 10 % EX SOLN
CUTANEOUS | Status: AC
  Administered 2017-09-09: 14:00:00
  Filled 2017-09-09: qty 15

## 2017-09-09 MED ORDER — LIDOCAINE HCL (PF) 2 % IJ SOLN
INTRAMUSCULAR | Status: AC
  Administered 2017-09-09: 10 mL
  Filled 2017-09-09: qty 20

## 2017-09-09 MED ORDER — LIDOCAINE HCL (PF) 2 % IJ SOLN: 20.0000 mL | Freq: Once | INTRAMUSCULAR | Status: DC

## 2017-09-09 NOTE — ED Triage Notes (Signed)
Pt reports he was walking to mailbox and became dizzy and fell face first on driveway. Denies LOC. Has hematoma to forehead and has laceration to right hand. Skin tear to right elbow and abraised areas to nose

## 2017-09-09 NOTE — ED Provider Notes (Signed)
[facility] EMERGENCY DEPARTMENT Provider Note   CSN: [id number] Arrival date & time: 09/09/17  1327     History   Chief Complaint Chief Complaint  Patient presents with  . Fall  . Laceration    HPI Juan Quinn is a 84 y.o. male.  HPI Patient presents after a fall.  States he got dizzy and fell over.  States this is not unusual for him.  States that he has been having episodes of these before states there is some medicine he supposed to take for the has not been taking.  States his been having diarrhea for months now and is seen several different people without a cause.  States he fell forward and hit his head and right hand.  States his hand is the worst wound.  No jaw pain.  No loss of consciousness.  No confusion.  He is not on anticoagulation. Past Medical History:  Diagnosis Date  . Adenomatous colon polyp 2000   Surveillance due 2017  . Helicobacter pylori antibody positive 2015   treatment with Prevpac  . HTN (hypertension)   . Hypercholesteremia   . Squamous cell carcinoma     Patient Active Problem List   Diagnosis Date Noted  . Abdominal pain, bilateral lower quadrant 12/25/2016  . Diarrhea 11/06/2016  . Abnormal weight loss 11/06/2016  . Diarrhea of presumed infectious origin 10/07/2016  . PUD (peptic ulcer disease) 07/26/2013  . Helicobacter pylori ab+ 07/26/2013  . Upper GI bleed 05/23/2013  . HTN (hypertension) 05/23/2013  . Hyperlipidemia 05/23/2013  . Anemia due to acute blood loss 05/23/2013  . Hematemesis 05/23/2013  . Hx of adenomatous colonic polyps 07/29/2010  . Adenomatous polyp of colon 07/29/2010    Past Surgical History:  Procedure Laterality Date  . BIOPSY  10/24/2016   Procedure: BIOPSY;  Surgeon: Daneil Dolin, MD;  [facility];  Service: Endoscopy;;  colon  . COLONOSCOPY  June 2012   Dr. Gala Romney: adenomatous polyps, due for surveillance if health permits in 3710  . COLONOSCOPY N/A 10/24/2016   Procedure: COLONOSCOPY;   Surgeon: Daneil Dolin, MD;  [facility];  Service: Endoscopy;  Laterality: N/A;  2:00pm  . ESOPHAGOGASTRODUODENOSCOPY N/A 05/24/2013   Dr. Rourk:Prepyloric gastric ulcerations-likely source of hematemesis-likely NSAID-related. H.PYLORI SEROLOGY POSITIVE, TREATED WITH PREVPAC  . ESOPHAGOGASTRODUODENOSCOPY N/A 05/23/2013   GYI:[id number] EGD secondary to much clot and food debris in the stomach  . ESOPHAGOGASTRODUODENOSCOPY N/A 08/16/2013   Dr. Gala Romney: normal esophagus, stomach empty, deformity of antrum. scar present. Previously noted ulcers completed healed. Patent pylorus, normal D1 and D2  . HEMORRHOID SURGERY    . POLYPECTOMY  10/24/2016   Procedure: POLYPECTOMY;  Surgeon: Daneil Dolin, MD;  [facility];  Service: Endoscopy;;  colon  . shoulder       OB History   None      Home Medications    Prior to Admission medications   Medication Sig Start Date End Date Taking? Authorizing Provider  allopurinol (ZYLOPRIM) 300 MG tablet Take 300 mg by mouth daily.   Yes [provider]  amLODipine (NORVASC) 5 MG tablet Take 5 mg by mouth daily. 07/20/16  Yes [provider]  Cholecalciferol (VITAMIN D3) 1000 UNITS CAPS Take 1 capsule by mouth daily.     Yes [provider]  fish oil-omega-3 fatty acids 1000 MG capsule Take 1 g by mouth daily.     Yes [provider]  ipratropium (ATROVENT) 0.06 %  nasal spray Place 2 sprays into both nostrils daily as needed for rhinitis.  04/29/13  Yes [provider]  NON FORMULARY OTC Restful Legs  Takes one at bedtime   Yes [provider]  Polyethyl Glycol-Propyl Glycol (SYSTANE OP) Apply 1 drop to eye 4 (four) times daily as needed (dry eyes).   Yes [provider]  Probiotic Product (RESTORA PO) Take by mouth.   Yes [provider]  Simethicone (GAS RELIEF PO) Take 1 tablet by mouth 2 (two) times daily.   Yes [provider]  simvastatin (ZOCOR) 40 MG  tablet Take 40 mg by mouth daily.    Yes [provider]  terazosin (HYTRIN) 5 MG capsule Take 5 mg by mouth daily.    Yes [provider]  vitamin B-12 (CYANOCOBALAMIN) 1000 MCG tablet Take 1,000 mcg by mouth daily.   Yes [provider]  cephALEXin (KEFLEX) 500 MG capsule Take 1 capsule (500 mg total) by mouth 4 (four) times daily. 09/09/17   Davonna Belling, MD    Family History Family History  Problem Relation Age of Onset  . Stroke Mother   . Stroke Father   . Pancreatic cancer Sister   . Leukemia Brother   . Lung cancer Sister   . Colon cancer Neg Hx     Social History Social History   Tobacco Use  . Smoking status: Former Smoker    Packs/day: 2.00    Years: 50.00    Pack years: 100.00    Types: Cigarettes    Last attempt to quit: 07/28/2000    Years since quitting: 17.1  . Smokeless tobacco: Former Systems developer    Quit date: 02/25/1999  Substance Use Topics  . Alcohol use: No    Comment: couple glasses wine/week, occ beer or rmixed drink  . Drug use: No    Frequency: 2.0 times per week     Allergies   Patient has no known allergies.   Review of Systems Review of Systems  Constitutional: Negative for appetite change.  HENT: Negative for congestion.   Respiratory: Negative for shortness of breath.   Genitourinary: Negative for flank pain.  Musculoskeletal: Negative for back pain and neck pain.  Neurological: Positive for dizziness.  Psychiatric/Behavioral: Negative for confusion.     Physical Exam Updated Vital Signs BP (!) 136/99 (BP Location: Left Arm)   Pulse (!) 146   Temp 97.8 F (36.6 C) (Oral)   Resp 15   Wt 88.5 kg (195 lb)   SpO2 97%   BMI 28.80 kg/m   Physical Exam  Constitutional: He appears well-developed.  HENT:  Abrasion with hematoma on anterior forehead between eyes with abrasion over bridge of nose.  Abrasion over mid lower lip.  Abrasion over chin.  No underlying bony tenderness on the lip or jaw.  Eyes:  Pupils are equal, round, and reactive to light.  Neck: Neck supple.  Cardiovascular: Normal rate.  Pulmonary/Chest: Effort normal.  Musculoskeletal:  Abrasion with ecchymosis to right lateral elbow without underlying bony tenderness.  Good range of motion.  Right hand has large laceration from the dorsal aspect over the fifth finger curving around the medial aspect down through the hyperthenar area on the hand and medially.  Approximately 7 cm long.  Good flexion extension at the fingers.  Sensation and capillary refill intact distally.  Neurological: He is alert.  Skin: Skin is warm. Capillary refill takes less than 2 seconds.     ED Treatments / Results  Labs (all labs ordered are listed, but only abnormal results are displayed) Labs Reviewed - No data to display  EKG None  Radiology Ct Head Wo Contrast  Result Date: 09/09/2017 CLINICAL DATA:  Dizziness while walking outside. Fell striking head. EXAM: CT HEAD WITHOUT CONTRAST TECHNIQUE: Contiguous axial images were obtained from the base of the skull through the vertex without intravenous contrast. COMPARISON:  None. FINDINGS: Brain: Age related atrophy. Chronic small-vessel ischemic changes of the white matter. No sign of acute infarction, mass lesion, hemorrhage, hydrocephalus or extra-axial collection. Vascular: There is atherosclerotic calcification of the major vessels at the base of the brain. Skull: Negative Sinuses/Orbits: Clear/normal Other: Forehead scalp hematoma. IMPRESSION: Forehead scalp hematoma. No traumatic intracranial finding. Age related atrophy and chronic small-vessel ischemic changes of the white matter. Electronically Signed   By: Nelson Chimes M.D.   On: 09/09/2017 14:43   Dg Hand Complete Right  Result Date: 09/09/2017 CLINICAL DATA:  Right hand laceration secondary to a fall. EXAM: RIGHT HAND - COMPLETE 3+ VIEW COMPARISON:  None. FINDINGS: There is no fracture or dislocation. Slight arthritic changes of the DIP  joints of the fingers. No other abnormality. IMPRESSION: No acute abnormality.  Minimal arthritic changes. Electronically Signed   By: Lorriane Shire M.D.   On: 09/09/2017 14:51    Procedures .Marland KitchenLaceration Repair Date/Time: 09/09/2017 10:09 PM Performed by: Davonna Belling, MD Authorized by: Davonna Belling, MD   Consent:    Consent obtained:  Verbal   Consent given by:  Patient   Risks discussed:  Infection, pain, need for additional repair, poor cosmetic result, poor wound healing, vascular damage, tendon damage and retained foreign body   Alternatives discussed:  No treatment and delayed treatment Anesthesia (see MAR for exact dosages):    Anesthesia method:  Local infiltration   Local anesthetic:  Lidocaine 2% w/o epi Laceration details:    Location:  Hand   Hand location:  R palm   Length (cm):  10 Repair type:    Repair type:  Complex Pre-procedure details:    Preparation:  Patient was prepped and draped in usual sterile fashion Exploration:    Wound exploration: wound explored through full range of motion and entire depth of wound probed and visualized     Contaminated: yes   Treatment:    Area cleansed with:  Saline   Amount of cleaning:  Extensive   Irrigation solution:  Sterile saline   Irrigation volume:  1liter   Irrigation method:  Pressure wash   Visualized foreign bodies/material removed: yes     Debridement:  Minimal Subcutaneous repair:    Suture size:  4-0   Suture material:  Vicryl   Number of sutures:  4 Skin repair:    Repair method:  Sutures   Suture size:  3-0   Suture material:  Prolene   Suture technique:  Simple interrupted   Number of sutures:  17 Approximation:    Approximation:  Close Post-procedure details:    Dressing:  Open (no dressing)   Patient tolerance of procedure:  Tolerated well, no immediate complications   (including critical care time)  Medications Ordered in ED Medications  Tdap (BOOSTRIX) injection 0.5 mL (0.5 mLs  Intramuscular Given 09/09/17 1428)  lidocaine (XYLOCAINE) 2 % injection (10 mLs  Given 09/09/17 1429)  povidone-iodine (BETADINE) 10 % external solution (  Given 09/09/17 1429)  lidocaine (XYLOCAINE) 2 % injection (  Given 09/09/17 1549)     Initial Impression / Assessment and Plan /  ED Course  I have reviewed the triage vital signs and the nursing notes.  Pertinent labs & imaging results that were available during my care of the patient were reviewed by me and considered in my medical decision making (see chart for details).     Patient with fall.  Facial contusion..  Also hand laceration.  Laceration closed by me.  Empiric antibiotics given due to some contamination.  I pulled out a piece of grass from the wound.  Discharge home.  Final Clinical Impressions(s) / ED Diagnoses   Final diagnoses:  Fall, initial encounter  Facial contusion, initial encounter  Abrasion  Laceration of right hand with foreign body, initial encounter    ED Discharge Orders        Ordered    cephALEXin (KEFLEX) 500 MG capsule  4 times daily     09/09/17 1609       Davonna Belling, MD 09/09/17 2215

## 2017-09-09 NOTE — Discharge Instructions (Signed)
Have the sutures taken out in a week to 10 days.

## 2017-09-09 NOTE — ED Notes (Signed)
Abrasions to head/knees/upper arm and right forearm cleansed with surclens solution and covered with sterile gauze. Pt dizziness spells are not new. Pt states since having episodes of diarrhea has had multiple episodes of dizziness when leaning over and standing back up.

## 2017-09-22 ENCOUNTER — Encounter (INDEPENDENT_AMBULATORY_CARE_PROVIDER_SITE_OTHER): Payer: Self-pay | Admitting: Internal Medicine

## 2017-09-22 ENCOUNTER — Ambulatory Visit (INDEPENDENT_AMBULATORY_CARE_PROVIDER_SITE_OTHER): Payer: Medicare HMO | Admitting: Internal Medicine

## 2017-09-22 VITALS — BP 120/74 | HR 72 | Temp 98.0°F | Resp 18 | Ht 69.0 in | Wt 194.6 lb

## 2017-09-22 DIAGNOSIS — K529 Noninfective gastroenteritis and colitis, unspecified: Secondary | ICD-10-CM | POA: Diagnosis not present

## 2017-09-22 DIAGNOSIS — R634 Abnormal weight loss: Secondary | ICD-10-CM

## 2017-09-22 NOTE — Patient Instructions (Signed)
Keep stool diary as to frequency and consistency of stools as discussed. Physician will call with results of blood tests and further recommendations.

## 2017-09-22 NOTE — Progress Notes (Signed)
Reason for consultation:  Chronic diarrhea unresponsive to therapy.  History of present illness:  Patient is 84 year old Caucasian male who was referred through courtesy of Dr. Lucia Gaskins for evaluation of chronic diarrhea. Patient is accompanied by his wife Dub Mikes. Patient states diarrhea started 1 year ago on September 19, 2016.  He recalls that weekend he had a cookout.  2 days later he and his wife developed diarrhea.  His wife's illness lasted for 24 hours but his has continued.  To begin with he was having 5-6 stools per day.  He did not experience associated symptoms such as rectal bleeding abdominal cramping fever chills or skin rash.  He was seen by Dr. Cindie Laroche and treated with metronidazole for 7 days but without symptomatic improvement.  He was then treated with Cipro for 7 days and once again diarrhea persisted. 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 Patient was then referred to Dr. Gala Romney in August 2018. He had the following studies: Stool culture was negative. Giardia antigen negative. He underwent colonoscopy on 10/24/2016.  He had sigmoid diverticulosis 3 polyps and external hemorrhoids.  Biopsy from colon was negative for microscopic colitis.  1 of the polyps was tubulovillous adenoma and the other polyp was tubular adenoma. Patient also had stool sample obtained for GI pathogen panel and C. difficile quick screen and the studies were also negative. Tissue transglutaminase antibody IgA was negative. Serum IgA was 170. TSH was 1.39.  He was then treated dicyclomine, Levsin as well as Xifaxan but without any benefit. Along the way he was also treated with cholestyramine but could not tell any difference. He also was treated with Crestor/probiotic for 2 to 3 months but did not make any difference  He had urinary 5 hydroxy indole acetic acid and this was again within  normal limits.    He had CT angios abdomen and pelvis on 12/26/2016 because of persistent diarrhea and abdominal pain and he was felt to have epiploic appendagitis in the region of proximal descending/sigmoid colon.  Patient was then referred to [facility] in November 2018 and he underwent hydrogen breath test and was abnormal.  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  He was retreated with metronidazole and his wife recalls that he got some better but on stopping medication he was back to baseline and he was retreated.  He also has been cephalexin but without any improvement. He also has tried on lactose-free diet but it has not made any difference. On his last visit at [facility] he was prescribed Viberzi.  Patient has been on it for more than a week and feels he may be some better. Patient states he has not heard from Dr. Azzie Almas office for over a month and he has decided not to go back.  His symptoms now include diarrhea with 3-4 stools per day.  On [place] stool chart he states his stool is 5 6 or 7.  He has excessive uncontrollable flatus.  Every 5 days or so he has explosive bowel movement which he calls a blowout.  He has urgency.  He has had 5 or 6 accidents.  He has occasional nocturnal bowel movement.  Since his diarrhea began he has lost 25 pounds.  He feels he has lost 5 pounds in the last 4 weeks.  He does not have good appetite.  He states nothing tastes good but he likes sweets.  There is  no history of travel outside the country.  He drinks bottled and city water. He says he tried cholestyramine for 4 months without any benefit. He also has taken Imodium which has not helped. He also tried a gluten-free diet for 2 weeks but without any benefit. He denies melena or rectal bleeding nausea vomiting fever chills or night sweats.  He does not have skin rash or dysuria.   Current Medications: Outpatient Encounter Medications as of 09/22/2017  Medication Sig  . allopurinol  (ZYLOPRIM) 300 MG tablet Take 300 mg by mouth daily.  Marland Kitchen amLODipine (NORVASC) 5 MG tablet Take 5 mg by mouth daily.  . Cholecalciferol (VITAMIN D3) 3000 units TABS Take 1 capsule by mouth daily.   . fish oil-omega-3 fatty acids 1000 MG capsule Take 1 g by mouth daily.    Marland Kitchen ipratropium (ATROVENT) 0.06 % nasal spray Place 2 sprays into both nostrils daily as needed for rhinitis.   Marland Kitchen meclizine (ANTIVERT) 25 MG tablet Take 25 mg by mouth as needed for dizziness.  Vladimir Faster Glycol-Propyl Glycol (SYSTANE OP) Apply 1 drop to eye 4 (four) times daily as needed (dry eyes).  . Simethicone (GAS RELIEF PO) Take 1 tablet by mouth 2 (two) times daily.  . simvastatin (ZOCOR) 40 MG tablet Take 40 mg by mouth daily.   Marland Kitchen terazosin (HYTRIN) 5 MG capsule Take 5 mg by mouth daily.   Marland Kitchen VIBERZI 100 MG TABS Take 1 tablet by mouth 2 (two) times daily.  . vitamin B-12 (CYANOCOBALAMIN) 1000 MCG tablet Take 1,000 mcg by mouth daily. Patient states that he takes 3000 mcg daily  . [DISCONTINUED] cephALEXin (KEFLEX) 500 MG capsule Take 1 capsule (500 mg total) by mouth 4 (four) times daily. (Patient not taking: Reported on 09/22/2017)  . [DISCONTINUED] NON FORMULARY OTC Restful Legs  Takes one at bedtime  . [DISCONTINUED] Probiotic Product (RESTORA PO) Take by mouth.   No facility-administered encounter medications on file as of 09/22/2017.    Past Medical History:  Diagnosis Date  . Adenomatous colon polyp 2000   Surveillance due 2017  . Helicobacter pylori antibody positive 2015   treatment with Prevpac  . HTN (hypertension)   . Hypercholesteremia   . Squamous cell carcinoma        He had shingles in December 2018.      He has hearing impairment.        He had shoulder released for bilateral frozen shoulder.      Past Surgical History:  Procedure Laterality Date  . BIOPSY  10/24/2016   Procedure: BIOPSY;  Surgeon: Daneil Dolin, MD;  Location: [facility];  Service: Endoscopy;;  colon  . COLONOSCOPY  June  2012   Dr. Gala Romney: adenomatous polyps, due for surveillance if health permits in [id number]  . COLONOSCOPY N/A 10/24/2016   Procedure: COLONOSCOPY;  Surgeon: Daneil Dolin, MD;  Location: [facility];  Service: Endoscopy;  Laterality: N/A;  2:00pm  . ESOPHAGOGASTRODUODENOSCOPY N/A 05/24/2013   Dr. Rourk:Prepyloric gastric ulcerations-likely source of hematemesis-likely NSAID-related. H.PYLORI SEROLOGY POSITIVE, TREATED WITH PREVPAC  . ESOPHAGOGASTRODUODENOSCOPY N/A 05/23/2013   PPJ:[id number] EGD secondary to much clot and food debris in the stomach  . ESOPHAGOGASTRODUODENOSCOPY N/A 08/16/2013   Dr. Gala Romney: normal esophagus, stomach empty, deformity of antrum. scar present. Previously noted ulcers completed healed. Patent pylorus, normal D1 and D2  . HEMORRHOID SURGERY    . POLYPECTOMY  10/24/2016   Procedure: POLYPECTOMY;  Surgeon: Daneil Dolin, MD;  Location: [facility];  Service: Endoscopy;;  colon  . shoulder     Allergies: No Known Allergies  Family history: Mother died of CVA at age 66. Father also died of CVA at age 39. He has 1 sister living and she is 56 years old.  Other 5 siblings are deceased one sister died of pancreatic carcinoma in her late 35s and another one died of lung carcinoma also in her late 40s.  One sister died of heart problems at age 34.  One brother died of leukemia at age 51 and another brother committed suicide at age 76.   Social history:  Patient is retired.  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  He works in another company for 14 years. He has 4 children from his first marriage.  He has 2 sons and 2 daughters in good health.  He married again 7 years ago.  He smokes cigarettes 2 packs a day for 30 years but quit 30 years ago.  He does not drink alcohol.   Physical examination: Blood pressure 120/74, pulse 72, temperature 98 F (36.7 C), temperature source Oral, resp. rate 18, height 5\' 9"  (1.753  m), weight 194 lb 9.6 oz (88.3 kg). Patient is alert and in no acute distress. Conjunctiva is pink. Sclera is nonicteric Oropharyngeal mucosa is normal. No neck masses or thyromegaly noted. Cardiac exam with regular rhythm normal S1 and S2.  He has faint systolic ejection murmur best heard at aortic area. Lungs are clear to auscultation. Abdomenis symmetrical.  Bowel sounds are normal.  No bruit noted.  On palpation abdomen is soft and nontender without organomegaly or masses. No LE edema or clubbing noted.  Labs/studies Results: Lab data as above + Lab data from 11/21/2016  WBC 6.9, H&H 13.8 and 39.4, MCV 99.7 and platelet count 130 8K.  Glucose 131 BUN 14 and creatinine 0.80  serum sodium 140, potassium 3.9, chloride 104, CO2 29 Serum calcium 9.0 Bilirubin 0.6, AP 57, AST 13, ALT 9, total protein 5.7 and albumin 3.7.   Assessment:  Patient is 84 year old Caucasian male who presents with 1 year history of diarrhea associated with weight loss and excessive flatulence he has undergone extensive work-up.  He has not responded to various therapies.  He has been treated with metronidazole and Cipro to begin with but without symptomatic benefit.  Hydrogen breath test was positive at [facility] was treated again with metronidazole and Xifaxan but without improvement.  He is now on Viberzi believes it may be helping.  Hallmark r patient's chronic diarrhea is intractable flatulence.  His diarrhea clearly is consistent with malabsorption and may well be due to small intestinal bacterial overgrowth.  However he has not responded to therapy and he may simply need longer duration of antibiotic.  He could also have B12 deficiency  and Whipple's disease remains in differential diagnosis. Since he feels he is doing better on Viberzi will leave him on this therapy for another 2 weeks.  However I doubt that symptom control will be satisfactory with this medication as I do not believe he has IBS as a sole cause  of his diarrhea.    Recommendations:  Continue Viberzi at current dose for now. Patient will go to the lab for CBC with differential sed rate serum B12 and folate levels. He will continue stool diary as to frequency and consistency of stools. If lab studies are unremarkable will consider therapy with tetracycline And  arrange for small bowel follow-through. He may also need EGD with small bowel biopsy depending on his clinical course. Patient will return for office visit in 4 weeks.

## 2017-09-23 LAB — CBC WITH DIFFERENTIAL/PLATELET
BASOS PCT: 0.3 %
Basophils Absolute: 23 cells/uL (ref 0–200)
EOS PCT: 2.4 %
Eosinophils Absolute: 180 cells/uL (ref 15–500)
HCT: 41.4 % (ref 38.5–50.0)
Hemoglobin: 14.2 g/dL (ref 13.2–17.1)
Lymphs Abs: 2438 cells/uL (ref 850–3900)
MCH: 35.3 pg — ABNORMAL HIGH (ref 27.0–33.0)
MCHC: 34.3 g/dL (ref 32.0–36.0)
MCV: 103 fL — ABNORMAL HIGH (ref 80.0–100.0)
MONOS PCT: 8.8 %
MPV: 9.7 fL (ref 7.5–12.5)
NEUTROS ABS: 4200 {cells}/uL (ref 1500–7800)
Neutrophils Relative %: 56 %
Platelets: 191 10*3/uL (ref 140–400)
RBC: 4.02 10*6/uL — ABNORMAL LOW (ref 4.20–5.80)
RDW: 12.5 % (ref 11.0–15.0)
Total Lymphocyte: 32.5 %
WBC mixed population: 660 cells/uL (ref 200–950)
WBC: 7.5 10*3/uL (ref 3.8–10.8)

## 2017-09-23 LAB — FOLATE: FOLATE: 13.5 ng/mL

## 2017-09-23 LAB — VITAMIN B12: Vitamin B-12: >2000 pg/mL — ABNORMAL HIGH (ref 200–1100)

## 2017-09-23 LAB — SEDIMENTATION RATE: SED RATE: 9 mm/h (ref 0–20)

## 2017-10-02 ENCOUNTER — Telehealth (INDEPENDENT_AMBULATORY_CARE_PROVIDER_SITE_OTHER): Payer: Self-pay | Admitting: *Deleted

## 2017-10-02 NOTE — Telephone Encounter (Signed)
Patient was called. Spoke with both he and his wife. Patient is not doing any better,he does okay while on the Viberzi. He has only 10 left.He was nauseated 1 day but he did not throw up. Patient's wife brought in a food and stool diary on 10/01/2017 for review.  Dr.Rehman was made aware.

## 2017-10-06 ENCOUNTER — Other Ambulatory Visit (INDEPENDENT_AMBULATORY_CARE_PROVIDER_SITE_OTHER): Payer: Self-pay | Admitting: *Deleted

## 2017-10-06 DIAGNOSIS — R197 Diarrhea, unspecified: Secondary | ICD-10-CM

## 2017-10-09 ENCOUNTER — Ambulatory Visit (HOSPITAL_COMMUNITY)
Admission: RE | Admit: 2017-10-09 | Discharge: 2017-10-09 | Disposition: A | Discharge status: Home / Self Care | Payer: Medicare HMO | Source: Ambulatory Visit | Attending: Internal Medicine | Admitting: Internal Medicine

## 2017-10-09 DIAGNOSIS — R197 Diarrhea, unspecified: Secondary | ICD-10-CM | POA: Diagnosis present

## 2017-10-13 ENCOUNTER — Ambulatory Visit (INDEPENDENT_AMBULATORY_CARE_PROVIDER_SITE_OTHER): Payer: Medicare HMO | Admitting: Internal Medicine

## 2017-10-13 ENCOUNTER — Encounter (INDEPENDENT_AMBULATORY_CARE_PROVIDER_SITE_OTHER): Payer: Self-pay | Admitting: Internal Medicine

## 2017-10-13 VITALS — BP 122/68 | HR 66 | Temp 97.8°F

## 2017-10-13 DIAGNOSIS — R634 Abnormal weight loss: Secondary | ICD-10-CM

## 2017-10-13 DIAGNOSIS — K529 Noninfective gastroenteritis and colitis, unspecified: Secondary | ICD-10-CM

## 2017-10-13 MED ORDER — RIFAXIMIN 550 MG PO TABS: 550.0000 mg | ORAL_TABLET | Freq: Two times a day (BID) | ORAL | 1 refills | Status: DC

## 2017-10-13 NOTE — Progress Notes (Signed)
Presenting complaint;  Follow-up for chronic diarrhea and intractable flatulence.  Database and subjective:  Patient is 84 year old Caucasian male who has 1 year history of chronic diarrhea and has undergone extensive work-up both locally and also at [facility].  He was last seen on 09/22/2017. Patient was advised to continue Viberzi as he thought he was getting better.  He had CBC  with differential sed rate B12 and folate levels.  B12 and folate levels were normal. He send me a stool diary which revealed persistent diarrhea and flatulence. I therefore proceeded with small bowel follow-through which was done yesterday.  Patient is here for scheduled visit accompanied by his wife.  He has Stool diary for the last 3 weeks.  He is having anywhere from 1-4 stools per day.  All of his stools are loose to mushy and he passes excessive amount of flatus.  He does not have control over his flatus.  He also has nocturnal diarrhea at least twice a week.  He feels bloated.  He denies melena or rectal bleeding.  He says his appetite is good.  His wife states that he does not eat healthy foods.  He does not eat salads greens or beans.  He eats sweets.  He has not had any accident but he has urgency.  He recalls he was tried on Xifaxan by Dr. Gala Romney for 12 days but did not tell any difference.  He has been treated with metronidazole on at least 2 different occasions.  Similarly he did not have any symptomatic improvement with cholestyramine. He has not lost any weight since his last visit.  All in all he has lost 25 pounds since his diarrhea began. He states he has 1 week of Viberzi left and wonders if he should stop this medication.  He states his co-pay is $100 per month.   Current Medications: Outpatient Encounter Medications as of 10/13/2017  Medication Sig  . allopurinol (ZYLOPRIM) 300 MG tablet Take 300 mg by mouth daily.  Marland Kitchen amLODipine (NORVASC) 5 MG tablet Take 5 mg by mouth daily.  . Cholecalciferol (VITAMIN  D3) 2000 units TABS Take 1 capsule by mouth 2 (two) times daily.   . fish oil-omega-3 fatty acids 1000 MG capsule Take 1 g by mouth daily.    Marland Kitchen ipratropium (ATROVENT) 0.06 % nasal spray Place 2 sprays into both nostrils daily as needed for rhinitis.   Marland Kitchen meclizine (ANTIVERT) 25 MG tablet Take 25 mg by mouth as needed for dizziness.  Vladimir Faster Glycol-Propyl Glycol (SYSTANE OP) Apply 1 drop to eye 4 (four) times daily as needed (dry eyes).  . simvastatin (ZOCOR) 40 MG tablet Take 40 mg by mouth daily.   Marland Kitchen terazosin (HYTRIN) 5 MG capsule Take 5 mg by mouth daily.   Marland Kitchen VIBERZI 100 MG TABS Take 1 tablet by mouth 2 (two) times daily.  . vitamin B-12 (CYANOCOBALAMIN) 1000 MCG tablet Take 1,000 mcg by mouth daily. Patient states that he takes 3000 mcg daily  . [DISCONTINUED] Simethicone (GAS RELIEF PO) Take 1 tablet by mouth 2 (two) times daily.   No facility-administered encounter medications on file as of 10/13/2017.      Objective: Blood pressure 122/68, pulse 66, temperature 97.8 F (36.6 C), temperature source Oral.  Weight 194 pounds; he is 69 inches tall. Patient is alert and in no acute distress. Conjunctiva is pink. Sclera is nonicteric Oropharyngeal mucosa is normal. No neck masses or thyromegaly noted. Cardiac exam with regular rhythm normal S1 and S2.  He has faint systolic murmur best heard at aortic area. Lungs are clear to auscultation. Abdomen is symmetrical.  Bowel sounds are normal.  On percussion noticed tympanitic on palpation is soft and nontender with organomegaly or masses. No LE edema or clubbing noted.  Labs/studies Results:  Lab data from 09/22/2017  WBC 7.5, H&H 14.2 and 41.4 and MCV 1 with 3. Platelet count 191K. Differential unremarkable.  Eosinophil 2.4%.  Absolute count 180 which is normal. Sed rate 9. B12 greater than 2000 Folate level 13.5.  Small bowel follow-through reveals rapid transit time of 30 to 35% but no mucosal abnormality or dilation of small  bowel noted.  Assessment:  Chronic diarrhea with excessive/intractable flatulence.  He has undergone extensive work-up and the only test that came back positive was hydrogen breath test for small intestinal bacterial overgrowth but his response to multiple antibiotics has not been satisfactory. He has noted some improvement while Viberzi which is used primarily for IBS with diarrhea.  His symptoms definitely suggest small intestinal bacterial overgrowth.  He may benefit from antibiotic course for more than 2 weeks. If he does not respond to antibiotic would consider EGD with duodenal biopsy.   Plan:  Patient will stop Viberzi when he finishes this prescription. Xifaxan 550 mg p.o. twice daily. If Xifaxan is not filled he will begin Pepto-Bismol 1 tablespoonful twice daily. Patient is to call with progress report in few weeks. I may consider empiric therapy with tetracycline before proceeding with EGD with small bowel biopsy. Next office visit in 2 months.

## 2017-10-13 NOTE — Patient Instructions (Signed)
Stop Viberzi when you start Xifaxan. If Xifaxan prescription not filled start Pepto-Bismol 1 tablespoon followed by mouth twice daily. Stool diary as you are doing.

## 2017-10-15 ENCOUNTER — Telehealth (INDEPENDENT_AMBULATORY_CARE_PROVIDER_SITE_OTHER): Payer: Self-pay | Admitting: *Deleted

## 2017-10-15 NOTE — Telephone Encounter (Signed)
Patient was seen in the office 10/13/2017. Dr.Rehman prescribed Xifaxan 550 mg twice a day. We rec'd a prior authorization. We had samples , so the patient was provided 15 days of samples while we are working n the [place].  Patient was called and made aware.

## 2017-11-02 ENCOUNTER — Telehealth (INDEPENDENT_AMBULATORY_CARE_PROVIDER_SITE_OTHER): Payer: Self-pay | Admitting: *Deleted

## 2017-11-02 NOTE — Telephone Encounter (Signed)
Other pharmacies were called. Co pays with them were higher. Patient was called and made aware , also ask that he call his [employer] and find out how much more he would have to pay out before it would reach a catastrophic level for him . At that time he should have to pay 10% for the medication.  We did find 6 more days of Xifaxan Samples for the patient. This would be 21 days of treatment with the previous samples given.

## 2017-11-02 NOTE — Telephone Encounter (Signed)
Patient was called and made aware that the Xifaxan was approved through 12/30/218-02/23/2018. He will check with his pharmacy to see what the cost will be.  Patient was seen in the office 10/13/2017. A few days later we were able to supply the patient with 15 days of Xifaxan.  He states that at times he feels a little better and then he is not. He states that he had a big blow out on Saturday,decribes it as a lot of mushy stool. Prior to that he had a long skinny stool. He does say that he has had less gas. He was made aware that Dr.Rehman will be made aware.

## 2017-11-11 ENCOUNTER — Other Ambulatory Visit: Payer: Self-pay | Admitting: Physician Assistant

## 2017-12-14 ENCOUNTER — Telehealth (INDEPENDENT_AMBULATORY_CARE_PROVIDER_SITE_OTHER): Payer: Self-pay | Admitting: *Deleted

## 2017-12-14 NOTE — Telephone Encounter (Signed)
Patient's wife called to say that the patient has been taking the Pepto Bismol 2 tbs daily. One in the morning and the second one in the evening. This is all he is taking. Every 5-7 days he is having explosive bowel movements. Weight has not changed  Discussed with Dr.Rehman - he ask that if the patient feels that the Pepto Bismol is helping to continue taking it. Otherwise, he will see him at the scheduled ov 01/05/2018.  Will discuss , EGD with small bowel BX.

## 2018-01-05 ENCOUNTER — Inpatient Hospital Stay (HOSPITAL_COMMUNITY)
Admission: EM | Admit: 2018-01-05 | Discharge: 2018-01-07 | DRG: 310 | Disposition: A | Discharge status: Home / Self Care | Payer: Medicare HMO | Attending: Family Medicine | Admitting: Family Medicine

## 2018-01-05 ENCOUNTER — Encounter (INDEPENDENT_AMBULATORY_CARE_PROVIDER_SITE_OTHER): Payer: Self-pay | Admitting: Internal Medicine

## 2018-01-05 ENCOUNTER — Emergency Department (HOSPITAL_COMMUNITY): Payer: Medicare HMO

## 2018-01-05 ENCOUNTER — Ambulatory Visit (INDEPENDENT_AMBULATORY_CARE_PROVIDER_SITE_OTHER): Payer: Medicare HMO | Admitting: Internal Medicine

## 2018-01-05 ENCOUNTER — Encounter (HOSPITAL_COMMUNITY): Payer: Self-pay | Admitting: Emergency Medicine

## 2018-01-05 ENCOUNTER — Other Ambulatory Visit: Payer: Self-pay

## 2018-01-05 VITALS — BP 110/68 | HR 70 | Temp 98.4°F | Resp 18 | Ht 69.0 in | Wt 200.2 lb

## 2018-01-05 DIAGNOSIS — E7800 Pure hypercholesterolemia, unspecified: Secondary | ICD-10-CM | POA: Diagnosis present

## 2018-01-05 DIAGNOSIS — Z86010 Personal history of colonic polyps: Secondary | ICD-10-CM

## 2018-01-05 DIAGNOSIS — I4892 Unspecified atrial flutter: Secondary | ICD-10-CM | POA: Diagnosis present

## 2018-01-05 DIAGNOSIS — Z87891 Personal history of nicotine dependence: Secondary | ICD-10-CM

## 2018-01-05 DIAGNOSIS — Z01818 Encounter for other preprocedural examination: Secondary | ICD-10-CM | POA: Diagnosis not present

## 2018-01-05 DIAGNOSIS — M1A9XX0 Chronic gout, unspecified, without tophus (tophi): Secondary | ICD-10-CM | POA: Diagnosis present

## 2018-01-05 DIAGNOSIS — K228 Other specified diseases of esophagus: Secondary | ICD-10-CM | POA: Diagnosis not present

## 2018-01-05 DIAGNOSIS — I10 Essential (primary) hypertension: Secondary | ICD-10-CM | POA: Diagnosis present

## 2018-01-05 DIAGNOSIS — R197 Diarrhea, unspecified: Secondary | ICD-10-CM | POA: Diagnosis present

## 2018-01-05 DIAGNOSIS — Z7189 Other specified counseling: Secondary | ICD-10-CM | POA: Diagnosis not present

## 2018-01-05 DIAGNOSIS — N400 Enlarged prostate without lower urinary tract symptoms: Secondary | ICD-10-CM | POA: Diagnosis present

## 2018-01-05 DIAGNOSIS — I471 Supraventricular tachycardia: Secondary | ICD-10-CM

## 2018-01-05 DIAGNOSIS — I4891 Unspecified atrial fibrillation: Secondary | ICD-10-CM | POA: Diagnosis present

## 2018-01-05 DIAGNOSIS — K529 Noninfective gastroenteritis and colitis, unspecified: Secondary | ICD-10-CM | POA: Diagnosis present

## 2018-01-05 DIAGNOSIS — Z79899 Other long term (current) drug therapy: Secondary | ICD-10-CM | POA: Diagnosis not present

## 2018-01-05 DIAGNOSIS — Z7951 Long term (current) use of inhaled steroids: Secondary | ICD-10-CM

## 2018-01-05 DIAGNOSIS — E785 Hyperlipidemia, unspecified: Secondary | ICD-10-CM | POA: Diagnosis present

## 2018-01-05 LAB — I-STAT TROPONIN, ED: Troponin i, poc: 0.01 ng/mL (ref 0.00–0.08)

## 2018-01-05 LAB — TROPONIN I: Troponin I: <0.03 ng/mL (ref <0.03)

## 2018-01-05 LAB — CBC WITH DIFFERENTIAL/PLATELET
Abs Immature Granulocytes: 0.04 10*3/uL (ref 0.00–0.07)
BASOS PCT: 1 %
Basophils Absolute: 0 10*3/uL (ref 0.0–0.1)
EOS PCT: 2 %
Eosinophils Absolute: 0.1 10*3/uL (ref 0.0–0.5)
HCT: 43.5 % (ref 39.0–52.0)
Hemoglobin: 14.4 g/dL (ref 13.0–17.0)
Immature Granulocytes: 1 %
Lymphocytes Relative: 37 %
Lymphs Abs: 2.4 10*3/uL (ref 0.7–4.0)
MCH: 34.2 pg — AB (ref 26.0–34.0)
MCHC: 33.1 g/dL (ref 30.0–36.0)
MCV: 103.3 fL — ABNORMAL HIGH (ref 80.0–100.0)
MONO ABS: 0.7 10*3/uL (ref 0.1–1.0)
Monocytes Relative: 10 %
Neutro Abs: 3.3 10*3/uL (ref 1.7–7.7)
Neutrophils Relative %: 49 %
PLATELETS: 140 10*3/uL — AB (ref 150–400)
RBC: 4.21 MIL/uL — AB (ref 4.22–5.81)
RDW: 12.3 % (ref 11.5–15.5)
WBC: 6.6 10*3/uL (ref 4.0–10.5)
nRBC: 0 % (ref 0.0–0.2)

## 2018-01-05 LAB — COMPREHENSIVE METABOLIC PANEL
ALK PHOS: 63 U/L (ref 38–126)
ALT: 13 U/L (ref 0–44)
AST: 17 U/L (ref 15–41)
Albumin: 4 g/dL (ref 3.5–5.0)
Anion gap: 7 (ref 5–15)
BUN: 15 mg/dL (ref 8–23)
CALCIUM: 9.3 mg/dL (ref 8.9–10.3)
CO2: 26 mmol/L (ref 22–32)
Chloride: 107 mmol/L (ref 98–111)
Creatinine, Ser: 0.9 mg/dL (ref 0.61–1.24)
GFR calc Af Amer: >60 mL/min (ref >60)
Glucose, Bld: 108 mg/dL — ABNORMAL HIGH (ref 70–99)
Potassium: 3.5 mmol/L (ref 3.5–5.1)
SODIUM: 140 mmol/L (ref 135–145)
Total Bilirubin: 1 mg/dL (ref 0.3–1.2)
Total Protein: 6.8 g/dL (ref 6.5–8.1)

## 2018-01-05 LAB — MRSA PCR SCREENING: MRSA BY PCR: NEGATIVE

## 2018-01-05 LAB — TSH: TSH: 1.722 u[IU]/mL (ref 0.350–4.500)

## 2018-01-05 MED ORDER — OMEGA-3-ACID ETHYL ESTERS 1 G PO CAPS
1.0000 g | ORAL_CAPSULE | Freq: Every day | ORAL | Status: DC
  Administered 2018-01-05 – 2018-01-07 (×3): 1 g via ORAL
  Filled 2018-01-05 (×5): qty 1

## 2018-01-05 MED ORDER — DILTIAZEM HCL-DEXTROSE 100-5 MG/100ML-% IV SOLN (PREMIX)
INTRAVENOUS | Status: AC
  Administered 2018-01-05: 10:00:00
  Filled 2018-01-05: qty 100

## 2018-01-05 MED ORDER — DILTIAZEM HCL-DEXTROSE 100-5 MG/100ML-% IV SOLN (PREMIX)
5.0000 mg/h | Freq: Once | INTRAVENOUS | Status: AC
  Administered 2018-01-05: 5 mg/h via INTRAVENOUS

## 2018-01-05 MED ORDER — DILTIAZEM HCL 30 MG PO TABS
30.0000 mg | ORAL_TABLET | Freq: Once | ORAL | Status: AC
  Administered 2018-01-05: 30 mg via ORAL
  Filled 2018-01-05: qty 1

## 2018-01-05 MED ORDER — HEPARIN BOLUS VIA INFUSION
4500.0000 [IU] | Freq: Once | INTRAVENOUS | Status: AC
  Administered 2018-01-05: 4500 [IU] via INTRAVENOUS

## 2018-01-05 MED ORDER — ADENOSINE 6 MG/2ML IV SOLN
6.0000 mg | Freq: Once | INTRAVENOUS | Status: DC
  Filled 2018-01-05: qty 2

## 2018-01-05 MED ORDER — ALLOPURINOL 300 MG PO TABS
300.0000 mg | ORAL_TABLET | Freq: Every day | ORAL | Status: DC
  Administered 2018-01-05 – 2018-01-07 (×3): 300 mg via ORAL
  Filled 2018-01-05 (×5): qty 1

## 2018-01-05 MED ORDER — HEPARIN (PORCINE) 25000 UT/250ML-% IV SOLN
1400.0000 [IU]/h | INTRAVENOUS | Status: DC
  Administered 2018-01-05 – 2018-01-07 (×2): 1400 [IU]/h via INTRAVENOUS
  Filled 2018-01-05 (×2): qty 250

## 2018-01-05 MED ORDER — TERAZOSIN HCL 5 MG PO CAPS
5.0000 mg | ORAL_CAPSULE | Freq: Every day | ORAL | Status: DC
  Administered 2018-01-05 – 2018-01-07 (×3): 5 mg via ORAL
  Filled 2018-01-05 (×5): qty 1

## 2018-01-05 MED ORDER — VITAMIN D 25 MCG (1000 UNIT) PO TABS
2000.0000 [IU] | ORAL_TABLET | Freq: Two times a day (BID) | ORAL | Status: DC
  Administered 2018-01-05 – 2018-01-07 (×4): 2000 [IU] via ORAL
  Filled 2018-01-05 (×2): qty 2

## 2018-01-05 MED ORDER — ACETAMINOPHEN 325 MG PO TABS: 650.0000 mg | ORAL_TABLET | Freq: Four times a day (QID) | ORAL | Status: DC | PRN

## 2018-01-05 MED ORDER — SODIUM CHLORIDE 0.9 % IV SOLN: 250.0000 mL | INTRAVENOUS | Status: DC | PRN

## 2018-01-05 MED ORDER — ONDANSETRON HCL 4 MG/2ML IJ SOLN: 4.0000 mg | Freq: Four times a day (QID) | INTRAMUSCULAR | Status: DC | PRN

## 2018-01-05 MED ORDER — ONDANSETRON HCL 4 MG PO TABS: 4.0000 mg | ORAL_TABLET | Freq: Four times a day (QID) | ORAL | Status: DC | PRN

## 2018-01-05 MED ORDER — SIMVASTATIN 20 MG PO TABS
40.0000 mg | ORAL_TABLET | Freq: Every day | ORAL | Status: DC
  Administered 2018-01-05 – 2018-01-07 (×3): 40 mg via ORAL
  Filled 2018-01-05 (×3): qty 2

## 2018-01-05 MED ORDER — DILTIAZEM HCL 25 MG/5ML IV SOLN
10.0000 mg | Freq: Once | INTRAVENOUS | Status: AC
  Administered 2018-01-05: 10 mg via INTRAVENOUS

## 2018-01-05 MED ORDER — SODIUM CHLORIDE 0.9% FLUSH
3.0000 mL | Freq: Two times a day (BID) | INTRAVENOUS | Status: DC
  Administered 2018-01-05 – 2018-01-07 (×5): 3 mL via INTRAVENOUS

## 2018-01-05 MED ORDER — POLYETHYL GLYCOL-PROPYL GLYCOL 0.4-0.3 % OP GEL: Freq: Four times a day (QID) | OPHTHALMIC | Status: DC | PRN

## 2018-01-05 MED ORDER — SODIUM CHLORIDE 0.9% FLUSH: 3.0000 mL | INTRAVENOUS | Status: DC | PRN

## 2018-01-05 MED ORDER — DILTIAZEM HCL-DEXTROSE 100-5 MG/100ML-% IV SOLN (PREMIX)
5.0000 mg/h | INTRAVENOUS | Status: DC
  Filled 2018-01-05: qty 100

## 2018-01-05 MED ORDER — VITAMIN B-12 1000 MCG PO TABS
1000.0000 ug | ORAL_TABLET | Freq: Every day | ORAL | Status: DC
  Administered 2018-01-05 – 2018-01-07 (×3): 1000 ug via ORAL
  Filled 2018-01-05 (×5): qty 1

## 2018-01-05 MED ORDER — ACETAMINOPHEN 650 MG RE SUPP: 650.0000 mg | Freq: Four times a day (QID) | RECTAL | Status: DC | PRN

## 2018-01-05 NOTE — Progress Notes (Signed)
Pts HR sustaining at 58-60, Cardizem gtt is still running at 2.46mcg/hr. No po Cardizem ordered at this time. Lamar Blinks, NP paged to make aware.  Waiting for orders/call back.

## 2018-01-05 NOTE — Progress Notes (Signed)
New order per K. Schorr to discontinue to Cardizem gtt and given Cardizem po x1 dose. Will continue to monitor pt

## 2018-01-05 NOTE — Progress Notes (Signed)
ANTICOAGULATION CONSULT NOTE - Initial Consult  Pharmacy Consult for heparin Indication: atrial fibrillation  No Known Allergies  Patient Measurements: Height: 5\' 9"  (175.3 cm) Weight: 200 lb (90.7 kg) IBW/kg (Calculated) : 70.7 Heparin Dosing Weight: 90 kg  Vital Signs: Temp: 97.9 F (36.6 C) (11/12 0943) Temp Source: Oral (11/12 0943) BP: 115/67 (11/12 1500) Pulse Rate: 141 (11/12 0943)  Labs: Recent Labs    01/05/18 1001  HGB 14.4  HCT 43.5  PLT 140*  CREATININE 0.90  TROPONINI <0.03    Estimated Creatinine Clearance: 66.8 mL/min (by C-G formula based on SCr of 0.9 mg/dL).   Medical History: Past Medical History:  Diagnosis Date  . Adenomatous colon polyp 2000   Surveillance due 2017  . Helicobacter pylori antibody positive 2015   treatment with Prevpac  . HTN (hypertension)   . Hypercholesteremia   . Squamous cell carcinoma     Medications:   (Not in a hospital admission)  Assessment: Pharmacy consulted to dose heparin in patient with atrial fibrillation.  Patient was not on anticoagulants prior to admission.  Goal of Therapy:  Heparin level 0.3-0.7 units/ml Monitor platelets by anticoagulation protocol: Yes   Plan:  Give 4500 units bolus x 1 Start heparin infusion at 1400 units/hr Check anti-Xa level in 8 hours and daily while on heparin Continue to monitor H&H and platelets  Revonda Standard Karlo Goeden 01/05/2018,3:38 PM

## 2018-01-05 NOTE — ED Triage Notes (Signed)
Pt went to PCP this morning, was told his HR was elevated and he need to come to ED. Pt states he fell in July and was told the fall was probably due to rapid HR. Pt has not been seen by a cardiologist.  Denies any other symptoms.

## 2018-01-05 NOTE — H&P (Signed)
History and Physical    Juan Quinn:[id number] DOB: June 22, 1930 DOA: 01/05/2018  PCP: Lucia Gaskins, MD   Patient coming from: Home  Chief Complaint: Tachycardia  HPI: Juan Quinn is a 84 y.o. male with medical history significant for hypertension, dyslipidemia, gout, and chronic diarrhea who was being seen by his gastroenterologist Dr. Laural Golden this morning when he was noted to have a tachyarrhythmia in the office with heart rate between 130 to 150 bpm.  He is currently being worked up for chronic diarrhea that is thought to be a malabsorptive syndrome.  He was advised to come to the emergency room for further management.  Patient otherwise only had symptoms of some mild palpitations.  He denies any other symptoms of lightheadedness, dizziness, or shortness of breath.  Patient denies having any heart issues in the past and does not see a cardiologist.   ED Course: Vital signs initially demonstrated a tachyarrhythmia on EKG with atrial flutter for which she was bolused Cardizem and then started on a drip.  His heart rate has since improved to 80 to 90 bpm but he continues to remain in the same rhythm.  His chest x-ray was stable with no acute findings and troponin x2 sets have been within normal limits.  He is otherwise comfortable.  Review of Systems: All others reviewed and otherwise negative.  Past Medical History:  Diagnosis Date  . Adenomatous colon polyp 2000   Surveillance due 2017  . Helicobacter pylori antibody positive 2015   treatment with Prevpac  . HTN (hypertension)   . Hypercholesteremia   . Squamous cell carcinoma     Past Surgical History:  Procedure Laterality Date  . BIOPSY  10/24/2016   Procedure: BIOPSY;  Surgeon: Daneil Dolin, MD;  [facility];  Service: Endoscopy;;  colon  . COLONOSCOPY  June 2012   Dr. Gala Romney: adenomatous polyps, due for surveillance if health permits in 9562  . COLONOSCOPY N/A 10/24/2016   Procedure: COLONOSCOPY;  Surgeon:  Daneil Dolin, MD;  Location: [facility];  Service: Endoscopy;  Laterality: N/A;  2:00pm  . ESOPHAGOGASTRODUODENOSCOPY N/A 05/24/2013   Dr. Rourk:Prepyloric gastric ulcerations-likely source of hematemesis-likely NSAID-related. H.PYLORI SEROLOGY POSITIVE, TREATED WITH PREVPAC  . ESOPHAGOGASTRODUODENOSCOPY N/A 05/23/2013   ZHY:QMVHQIONGE EGD secondary to much clot and food debris in the stomach  . ESOPHAGOGASTRODUODENOSCOPY N/A 08/16/2013   Dr. Gala Romney: normal esophagus, stomach empty, deformity of antrum. scar present. Previously noted ulcers completed healed. Patent pylorus, normal D1 and D2  . HEMORRHOID SURGERY    . POLYPECTOMY  10/24/2016   Procedure: POLYPECTOMY;  Surgeon: Daneil Dolin, MD;  Location: [facility];  Service: Endoscopy;;  colon  . shoulder       reports that he quit smoking about 17 years ago. His smoking use included cigarettes. He has a 100.00 pack-year smoking history. He quit smokeless tobacco use about 18 years ago. He reports that he does not drink alcohol or use drugs.  No Known Allergies  Family History  Problem Relation Age of Onset  . Stroke Mother   . Stroke Father   . Pancreatic cancer Sister   . Leukemia Brother   . Lung cancer Sister   . Colon cancer Neg Hx     Prior to Admission medications   Medication Sig Start Date End Date Taking? Authorizing Provider  allopurinol (ZYLOPRIM) 300 MG tablet Take 300 mg by mouth daily.   Yes [provider]  amLODipine (NORVASC) 5 MG tablet  Take 5 mg by mouth daily. 07/20/16  Yes [provider]  Cholecalciferol (VITAMIN D3) 2000 units TABS Take 1 capsule by mouth 2 (two) times daily.    Yes [provider]  fish oil-omega-3 fatty acids 1000 MG capsule Take 1 g by mouth daily.     Yes [provider]  ipratropium (ATROVENT) 0.06 % nasal spray Place 2 sprays into both nostrils daily as needed for rhinitis.  04/29/13  Yes [provider]  Polyethyl Glycol-Propyl Glycol  (SYSTANE OP) Apply 1 drop to eye 4 (four) times daily as needed (dry eyes).   Yes [provider]  simvastatin (ZOCOR) 40 MG tablet Take 40 mg by mouth daily.    Yes [provider]  terazosin (HYTRIN) 5 MG capsule Take 5 mg by mouth daily.    Yes [provider]  vitamin B-12 (CYANOCOBALAMIN) 1000 MCG tablet Take 1,000 mcg by mouth daily. Patient states that he takes 3000 mcg daily   Yes [provider]    Physical Exam: Vitals:   01/05/18 1230 01/05/18 1300 01/05/18 1401 01/05/18 1430  BP: 116/60 115/63 130/82 129/74  Pulse:      Resp: 17 17 18  (!) 22  Temp:      TempSrc:      SpO2: 92% 94%  91%  Weight:      Height:        Constitutional: NAD, calm, comfortable Vitals:   01/05/18 1230 01/05/18 1300 01/05/18 1401 01/05/18 1430  BP: 116/60 115/63 130/82 129/74  Pulse:      Resp: 17 17 18  (!) 22  Temp:      TempSrc:      SpO2: 92% 94%  91%  Weight:      Height:       Eyes: lids and conjunctivae normal ENMT: Mucous membranes are moist.  Neck: normal, supple Respiratory: clear to auscultation bilaterally. Normal respiratory effort. No accessory muscle use.  On nasal cannula. Cardiovascular: Irregular rate and rhythm, no murmurs. No extremity edema. Abdomen: no tenderness, no distention. Bowel sounds positive.  Musculoskeletal:  No joint deformity upper and lower extremities.   Skin: no rashes, lesions, ulcers.  Psychiatric: Normal judgment and insight. Alert and oriented x 3. Normal mood.   Labs on Admission: I have personally reviewed following labs and imaging studies  CBC: Recent Labs  Lab 01/05/18 1001  WBC 6.6  NEUTROABS 3.3  HGB 14.4  HCT 43.5  MCV 103.3*  PLT 741*   Basic Metabolic Panel: Recent Labs  Lab 01/05/18 1001  NA 140  K 3.5  CL 107  CO2 26  GLUCOSE 108*  BUN 15  CREATININE 0.90  CALCIUM 9.3   GFR: Estimated Creatinine Clearance: 66.8 mL/min (by C-G formula based on SCr of 0.9 mg/dL). Liver Function  Tests: Recent Labs  Lab 01/05/18 1001  AST 17  ALT 13  ALKPHOS 63  BILITOT 1.0  PROT 6.8  ALBUMIN 4.0   No results for input(s): LIPASE, AMYLASE in the last 168 hours. No results for input(s): AMMONIA in the last 168 hours. Coagulation Profile: No results for input(s): INR, PROTIME in the last 168 hours. Cardiac Enzymes: Recent Labs  Lab 01/05/18 1001  TROPONINI <0.03   BNP (last 3 results) No results for input(s): PROBNP in the last 8760 hours. HbA1C: No results for input(s): HGBA1C in the last 72 hours. CBG: No results for input(s): GLUCAP in the last 168 hours. Lipid Profile: No results for input(s): CHOL, HDL, LDLCALC, TRIG,  CHOLHDL, LDLDIRECT in the last 72 hours. Thyroid Function Tests: No results for input(s): TSH, T4TOTAL, FREET4, T3FREE, THYROIDAB in the last 72 hours. Anemia Panel: No results for input(s): VITAMINB12, FOLATE, FERRITIN, TIBC, IRON, RETICCTPCT in the last 72 hours. Urine analysis: No results found for: COLORURINE, APPEARANCEUR, [place], [place], [place], [place], [place], [place], [place], [place], NITRITE, LEUKOCYTESUR  Radiological Exams on Admission: Dg Chest Portable 1 View  Result Date: 01/05/2018 CLINICAL DATA:  Cardiac arrhythmia EXAM: PORTABLE CHEST 1 VIEW COMPARISON:  October 02, 2015 FINDINGS: There is no edema or consolidation. Heart is mildly enlarged with pulmonary vascularity normal. No adenopathy. There is aortic atherosclerosis. No pneumothorax. No bone lesions. IMPRESSION: Stable cardiac prominence. No edema or consolidation. There is aortic atherosclerosis. Aortic Atherosclerosis (ICD10-I70.0). Electronically Signed   By: Lowella Grip III M.D.   On: 01/05/2018 10:30    EKG: Independently reviewed. Atrial flutter.  Assessment/Plan Principal Problem:   Atrial fibrillation with RVR (HCC) Active Problems:   HTN (hypertension)   Hyperlipidemia   Diarrhea    1. New onset atrial fibrillation with RVR.  Further  evaluation per cardiology consultation and monitor in stepdown unit with Cardizem drip for now and transition to oral once well controlled.  2D echocardiogram as well as TSH to be evaluated with morning labs to include magnesium.  Hold amlodipine for now continue to monitor blood pressure.  Heparin drip for chads vasc score of 4.  Will plan to carefully monitor thrombocytopenia that was noted with repeat CBC in a.m. 2. Chronic diarrhea-suspect malabsorptive syndrome.  Further evaluation per GI will be consulted for EGD in a.m.  Dr. Laural Golden is aware and will plan to discontinue heparin drip at 6 AM and keep n.p.o. after midnight.  Stool studies will also be ordered per GI. 3. Hypertension.  Monitor carefully while on Cardizem drip in stepdown unit and hold amlodipine for now. 4. Dyslipidemia.  Continue statin and fish oil. 5. Chronic gout.  Continue allopurinol. 6. BPH.  Continue Terazosin and monitor blood pressure.   DVT prophylaxis: Heparin drip Code Status: Full Family Communication: Wife at bedside Disposition Plan: Management of atrial fibrillation with RVR and further evaluation Consults called: Cardiology, GI Admission status: Inpatient, stepdown unit   Kaylany Tesoriero Darleen Crocker DO [facility] Pager [phone]  If 7PM-7AM, please contact night-coverage [email or web address] Password [email or web address]  01/05/2018, 3:08 PM

## 2018-01-05 NOTE — Progress Notes (Signed)
Presenting complaint;  Follow-up for chronic diarrhea.  Database and subjective:  Patient is 84 year old Caucasian male who is here for scheduled visit.  His diarrhea started in July 2018.  He has undergone extensive work-up both locally by Dr. Gala Romney and he was also seen at [facility].  He has been treated with multiple antibiotics as well as antidiarrheals Viberzi a gluten-free diet as well as lactose-free diet without sustained benefit.  He was also treated with pancreatic enzyme supplement.  All of this detailed in my note from September 22, 2017. Following his last visit on 10/13/2017 he had small bowel follow-through.  He had a rapid transit time but mucosa was normal.  He is here for scheduled visit accompanied by his wife.  He has Stool diary.  Review of diary reveals he has 3-4 bowel movements per day.  He is passing excessive flatus and most of his stools are loose and some or mushy and this morning he had a normal formed stool after several months.  His wife has saved pictures on her phone which she shared with me.  He remains with good appetite.  He has gained 6 pounds since his last visit.  He denies abdominal pain. Patient states he fell on September 09, 2017 and required multiple sutures to his right hand laceration and he also had extensive facial bruising.  He recalls he became lightheaded and dizzy but did not have frank syncope.  Current Medications: Outpatient Encounter Medications as of 01/05/2018  Medication Sig  . allopurinol (ZYLOPRIM) 300 MG tablet Take 300 mg by mouth daily.  Marland Kitchen amLODipine (NORVASC) 5 MG tablet Take 5 mg by mouth daily.  . Cholecalciferol (VITAMIN D3) 2000 units TABS Take 1 capsule by mouth 2 (two) times daily.   . fish oil-omega-3 fatty acids 1000 MG capsule Take 1 g by mouth daily.    Marland Kitchen ipratropium (ATROVENT) 0.06 % nasal spray Place 2 sprays into both nostrils daily as needed for rhinitis.   Vladimir Faster Glycol-Propyl Glycol (SYSTANE OP) Apply  1 drop to eye 4 (four) times daily as needed (dry eyes).  . simvastatin (ZOCOR) 40 MG tablet Take 40 mg by mouth daily.   Marland Kitchen terazosin (HYTRIN) 5 MG capsule Take 5 mg by mouth daily.   . vitamin B-12 (CYANOCOBALAMIN) 1000 MCG tablet Take 1,000 mcg by mouth daily. Patient states that he takes 3000 mcg daily  . [DISCONTINUED] meclizine (ANTIVERT) 25 MG tablet Take 25 mg by mouth as needed for dizziness.  . [DISCONTINUED] rifaximin (XIFAXAN) 550 MG TABS tablet Take 1 tablet (550 mg total) by mouth 2 (two) times daily. (Patient not taking: Reported on 01/05/2018)  . [DISCONTINUED] VIBERZI 100 MG TABS Take 1 tablet by mouth 2 (two) times daily.   No facility-administered encounter medications on file as of 01/05/2018.      Objective: Blood pressure 110/68, pulse 136 per minute and irregular., temperature 98.4 F (36.9 C), temperature source Oral, resp. rate 18, height 5\' 9"  (1.753 m), weight 200 lb 3.2 oz (90.8 kg). Patient is alert and in no acute distress. Conjunctiva is pink. Sclera is nonicteric Oropharyngeal mucosa is normal. No neck masses or thyromegaly noted. Cardiac exam with irregular rhythm normal S1 and S2. No murmur or gallop noted. Lungs are clear to auscultation. Abdomen is full.  Bowel sounds are hyperactive.  On palpation abdomen is soft and nontender with organomegaly or masses. No LE edema or clubbing noted.  Labs/studies Results:  Lab data from 12/28/2017 done at  [facility]  WBC 6.31, H&H 14.2 and 42.6.  MCV is 104.4.  Platelet count 158K. Serum sodium 145, potassium 4.3, chloride 107, CO2 31.5, serum calcium 9.3. Glucose 129. Bilirubin 0.6, AP 73, AST 16, ALT 16, total protein 6.4 and albumin 3.7. TSH 1.620 Vitamin D2 level normal at 56.7.  Assessment:  #1.  Chronic diarrhea.  He is suspected to have malabsorptive syndrome.  His work-up is incomplete.  Recent lab studies are unremarkable except mildly elevated MCV but his B12 and folate levels are  normal in July this year.  It is interesting to note that he had normal bowel movement today.  He needs a duodenal biopsy and quantitative fecal fat analysis.  #2.  Tachyarrhythmia.  Patient's heart rate is 136/min and irregular.  I notice he was in emergency room on September 09, 2017 after presyncope fall and injury.  His heart rate was 146/min.  No EKG was performed.  Plan:  Patient advised to go to the emergency room for management of his tachyarrhythmia which is most likely A. Fib. Dr. Roderic Palau, ER physician informed that patient will be arriving shortly. If patient is hospitalized will arrange for duodenal biopsy and quantitative fecal fat analysis. Office visit in 4 months.

## 2018-01-05 NOTE — ED Notes (Signed)
ED TO INPATIENT HANDOFF REPORT  Name/Age/Gender Juan Quinn 84 y.o. male  Code Status    Code Status Orders  (From admission, onward)         Start     Ordered   01/05/18 1521  Full code  Continuous     01/05/18 1526        Code Status History    Date Active Date Inactive Code Status Order ID Comments User Context   05/23/2013 1157 05/27/2013 1331 DNR [id number]  Domenic Polite, MD Inpatient      Home/SNF/Other Home  Chief Complaint Increased Heart Rate  Level of Care/Admitting Diagnosis ED Disposition    ED Disposition Condition [place]: [facility] [[id number]]  Level of Care: Stepdown [14]  Diagnosis: Atrial fibrillation with RVR [facility] [[id number]]  Admitting Physician: Rodena Goldmann [[id number]]  Attending Physician: Rodena Goldmann [[id number]]  Estimated length of stay: past midnight tomorrow  Certification:: I certify this patient will need inpatient services for at least 2 midnights  PT Class (Do Not Modify): Inpatient [101]  PT Acc Code (Do Not Modify): Private [1]       Medical History Past Medical History:  Diagnosis Date  . Adenomatous colon polyp 2000   Surveillance due 2017  . Helicobacter pylori antibody positive 2015   treatment with Prevpac  . HTN (hypertension)   . Hypercholesteremia   . Squamous cell carcinoma     Allergies No Known Allergies  IV Location/Drains/Wounds Patient Lines/Drains/Airways Status   Active Line/Drains/Airways    Name:   Placement date:   Placement time:   Site:   Days:   Peripheral IV 01/05/18 Right Antecubital   01/05/18    0950    Antecubital   less than 1   Peripheral IV 01/05/18 Right Forearm   01/05/18    1602    Forearm   less than 1          Labs/Imaging Results for orders placed or performed during the hospital encounter of 01/05/18 (from the past 48 hour(s))  I-stat troponin, ED     Status: None   Collection Time: 01/05/18  9:57 AM  Result Value Ref Range   Troponin i, poc  0.01 0.00 - 0.08 ng/mL   Comment 3            Comment: Due to the release kinetics of cTnI, a negative result within the first hours of the onset of symptoms does not rule out myocardial infarction with certainty. If myocardial infarction is still suspected, repeat the test at appropriate intervals.   CBC with Differential/Platelet     Status: Abnormal   Collection Time: 01/05/18 10:01 AM  Result Value Ref Range   WBC 6.6 4.0 - 10.5 K/uL   RBC 4.21 (L) 4.22 - 5.81 MIL/uL   Hemoglobin 14.4 13.0 - 17.0 g/dL   HCT 43.5 39.0 - 52.0 %   MCV 103.3 (H) 80.0 - 100.0 fL   MCH 34.2 (H) 26.0 - 34.0 pg   MCHC 33.1 30.0 - 36.0 g/dL   RDW 12.3 11.5 - 15.5 %   Platelets 140 (L) 150 - 400 K/uL   nRBC 0.0 0.0 - 0.2 %   Neutrophils Relative % 49 %   Neutro Abs 3.3 1.7 - 7.7 K/uL   Lymphocytes Relative 37 %   Lymphs Abs 2.4 0.7 - 4.0 K/uL   Monocytes Relative 10 %   Monocytes Absolute 0.7 0.1 - 1.0 K/uL  Eosinophils Relative 2 %   Eosinophils Absolute 0.1 0.0 - 0.5 K/uL   Basophils Relative 1 %   Basophils Absolute 0.0 0.0 - 0.1 K/uL   Immature Granulocytes 1 %   Abs Immature Granulocytes 0.04 0.00 - 0.07 K/uL    Comment: Performed at [facility], [address] [place], [place] [address]  Comprehensive metabolic panel     Status: Abnormal   Collection Time: 01/05/18 10:01 AM  Result Value Ref Range   Sodium 140 135 - 145 mmol/L   Potassium 3.5 3.5 - 5.1 mmol/L   Chloride 107 98 - 111 mmol/L   CO2 26 22 - 32 mmol/L   Glucose, Bld 108 (H) 70 - 99 mg/dL   BUN 15 8 - 23 mg/dL   Creatinine, Ser 0.90 0.61 - 1.24 mg/dL   Calcium 9.3 8.9 - 10.3 mg/dL   Total Protein 6.8 6.5 - 8.1 g/dL   Albumin 4.0 3.5 - 5.0 g/dL   AST 17 15 - 41 U/L   ALT 13 0 - 44 U/L   Alkaline Phosphatase 63 38 - 126 U/L   Total Bilirubin 1.0 0.3 - 1.2 mg/dL   GFR calc non Af Amer >60 >60 mL/min   GFR calc Af Amer >60 >60 mL/min    Comment: (NOTE) The eGFR has been calculated using the CKD EPI equation. This  calculation has not been validated in all clinical situations. eGFR's persistently <60 mL/min signify possible Chronic Kidney Disease.    Anion gap 7 5 - 15    Comment: Performed at [facility], [address], [place], [place] [address]  Troponin I - ONCE - STAT     Status: None   Collection Time: 01/05/18 10:01 AM  Result Value Ref Range   Troponin I <0.03 <0.03 ng/mL    Comment: Performed at [facility], [address] [place],  [address]   Dg Chest Portable 1 View  Result Date: 01/05/2018 CLINICAL DATA:  Cardiac arrhythmia EXAM: PORTABLE CHEST 1 VIEW COMPARISON:  October 02, 2015 FINDINGS: There is no edema or consolidation. Heart is mildly enlarged with pulmonary vascularity normal. No adenopathy. There is aortic atherosclerosis. No pneumothorax. No bone lesions. IMPRESSION: Stable cardiac prominence. No edema or consolidation. There is aortic atherosclerosis. Aortic Atherosclerosis (ICD10-I70.0). Electronically Signed   By: Lowella Grip III M.D.   On: 01/05/2018 10:30    Pending Labs Unresulted Labs (From admission, onward)    Start     Ordered   01/07/18 0500  CBC  Daily,   R     01/05/18 1543   01/06/18 0500  Magnesium  Tomorrow morning,   R     01/05/18 1526   01/06/18 9675  Basic metabolic panel  Tomorrow morning,   R     01/05/18 1526   01/06/18 0500  CBC  Tomorrow morning,   R     01/05/18 1526   01/06/18 0000  Heparin level (unfractionated)  Once,   R     01/05/18 1543   01/05/18 1521  Troponin I - Now Then Q6H  Now then every 6 hours,   R     01/05/18 1526   01/05/18 1521  TSH  Once,   R     01/05/18 1526          Vitals/Pain Today's Vitals   01/05/18 1300 01/05/18 1401 01/05/18 1430 01/05/18 1500  BP: 115/63 130/82 129/74 115/67  Pulse:      Resp: 17 18 (!) 22  16  Temp:      TempSrc:      SpO2: 94%  91% 94%  Weight:      Height:        Isolation Precautions No active isolations  Medications Medications  allopurinol (ZYLOPRIM) tablet  300 mg (has no administration in time range)  simvastatin (ZOCOR) tablet 40 mg (has no administration in time range)  terazosin (HYTRIN) capsule 5 mg (has no administration in time range)  vitamin B-12 (CYANOCOBALAMIN) tablet 1,000 mcg (has no administration in time range)  Vitamin D3 TABS 1 capsule (has no administration in time range)  omega-3 acid ethyl esters (LOVAZA) capsule 1 g (has no administration in time range)  sodium chloride flush (NS) 0.9 % injection 3 mL (has no administration in time range)  sodium chloride flush (NS) 0.9 % injection 3 mL (has no administration in time range)  0.9 %  sodium chloride infusion (has no administration in time range)  acetaminophen (TYLENOL) tablet 650 mg (has no administration in time range)    Or  acetaminophen (TYLENOL) suppository 650 mg (has no administration in time range)  ondansetron (ZOFRAN) tablet 4 mg (has no administration in time range)    Or  ondansetron (ZOFRAN) injection 4 mg (has no administration in time range)  diltiazem (CARDIZEM) 100 mg in dextrose 5% 139m (1 mg/mL) infusion (has no administration in time range)  heparin ADULT infusion 100 units/mL (25000 units/2560msodium chloride 0.45%) (1,400 Units/hr Intravenous New Bag/Given 01/05/18 1603)  diltiazem (CARDIZEM) injection 10 mg (10 mg Intravenous Given 01/05/18 1013)  dilTIAZem HCl-Dextrose 100-5 MG/100ML-% infusion SOLN (  Stopped 01/05/18 1010)  diltiazem (CARDIZEM) 100 mg in dextrose 5% 10034m1 mg/mL) infusion (5 mg/hr Intravenous New Bag/Given 01/05/18 1019)  heparin bolus via infusion 4,500 Units (4,500 Units Intravenous Bolus from Bag 01/05/18 1603)    Mobility walks

## 2018-01-05 NOTE — ED Provider Notes (Signed)
[facility] EMERGENCY DEPARTMENT Provider Note   CSN: [id number] Arrival date & time: 01/05/18  8786     History   Chief Complaint Chief Complaint  Patient presents with  . Tachycardia    HPI Juan Quinn is a 84 y.o. male.  Patient was seen by the GI doctor today and noticed to have a rapid heart rate.  Patient had mild palpitations  The history is provided by the patient. No language interpreter was used.  Palpitations   This is a new problem. The current episode started more than 2 days ago. The problem occurs constantly. The problem has not changed since onset.Associated with: Unknown. Episode Length: unknown. Pertinent negatives include no diaphoresis, no chest pain, no abdominal pain, no headaches, no back pain and no cough. He has tried nothing for the symptoms. The treatment provided no relief. There are no known risk factors. His past medical history does not include hyperthyroidism.    Past Medical History:  Diagnosis Date  . Adenomatous colon polyp 2000   Surveillance due 2017  . Helicobacter pylori antibody positive 2015   treatment with Prevpac  . HTN (hypertension)   . Hypercholesteremia   . Squamous cell carcinoma     Patient Active Problem List   Diagnosis Date Noted  . Atrial fibrillation with RVR (Archie) 01/05/2018  . Abdominal pain, bilateral lower quadrant 12/25/2016  . Diarrhea 11/06/2016  . Abnormal weight loss 11/06/2016  . Diarrhea of presumed infectious origin 10/07/2016  . PUD (peptic ulcer disease) 07/26/2013  . Helicobacter pylori ab+ 07/26/2013  . Upper GI bleed 05/23/2013  . HTN (hypertension) 05/23/2013  . Hyperlipidemia 05/23/2013  . Anemia due to acute blood loss 05/23/2013  . Hematemesis 05/23/2013  . Hx of adenomatous colonic polyps 07/29/2010  . Adenomatous polyp of colon 07/29/2010    Past Surgical History:  Procedure Laterality Date  . BIOPSY  10/24/2016   Procedure: BIOPSY;  Surgeon: Daneil Dolin, MD;  Location[facility];  Service: Endoscopy;;  colon  . COLONOSCOPY  June 2012   Dr. Gala Romney: adenomatous polyps, due for surveillance if health permits in 7672  . COLONOSCOPY N/A 10/24/2016   Procedure: COLONOSCOPY;  Surgeon: Daneil Dolin, MD;  Location: [facility];  Service: Endoscopy;  Laterality: N/A;  2:00pm  . ESOPHAGOGASTRODUODENOSCOPY N/A 05/24/2013   Dr. Rourk:Prepyloric gastric ulcerations-likely source of hematemesis-likely NSAID-related. H.PYLORI SEROLOGY POSITIVE, TREATED WITH PREVPAC  . ESOPHAGOGASTRODUODENOSCOPY N/A 05/23/2013   CNO:[id number] EGD secondary to much clot and food debris in the stomach  . ESOPHAGOGASTRODUODENOSCOPY N/A 08/16/2013   Dr. Gala Romney: normal esophagus, stomach empty, deformity of antrum. scar present. Previously noted ulcers completed healed. Patent pylorus, normal D1 and D2  . HEMORRHOID SURGERY    . POLYPECTOMY  10/24/2016   Procedure: POLYPECTOMY;  Surgeon: Daneil Dolin, MD;  Location: [facility];  Service: Endoscopy;;  colon  . shoulder       OB History   None      Home Medications    Prior to Admission medications   Medication Sig Start Date End Date Taking? Authorizing Provider  allopurinol (ZYLOPRIM) 300 MG tablet Take 300 mg by mouth daily.    [provider]  amLODipine (NORVASC) 5 MG tablet Take 5 mg by mouth daily. 07/20/16   [provider]  Cholecalciferol (VITAMIN D3) 2000 units TABS Take 1 capsule by mouth 2 (two) times daily.     [provider]  fish oil-omega-3 fatty acids 1000 MG capsule  Take 1 g by mouth daily.      [provider]  ipratropium (ATROVENT) 0.06 % nasal spray Place 2 sprays into both nostrils daily as needed for rhinitis.  04/29/13   [provider]  Polyethyl Glycol-Propyl Glycol (SYSTANE OP) Apply 1 drop to eye 4 (four) times daily as needed (dry eyes).    [provider]  simvastatin (ZOCOR) 40 MG tablet Take 40 mg by mouth daily.     [provider]  terazosin  (HYTRIN) 5 MG capsule Take 5 mg by mouth daily.     [provider]  vitamin B-12 (CYANOCOBALAMIN) 1000 MCG tablet Take 1,000 mcg by mouth daily. Patient states that he takes 3000 mcg daily    [provider]    Family History Family History  Problem Relation Age of Onset  . Stroke Mother   . Stroke Father   . Pancreatic cancer Sister   . Leukemia Brother   . Lung cancer Sister   . Colon cancer Neg Hx     Social History Social History   Tobacco Use  . Smoking status: Former Smoker    Packs/day: 2.00    Years: 50.00    Pack years: 100.00    Types: Cigarettes    Last attempt to quit: 07/28/2000    Years since quitting: 17.4  . Smokeless tobacco: Former Systems developer    Quit date: 02/25/1999  Substance Use Topics  . Alcohol use: No    Comment: couple glasses wine/week, occ beer or rmixed drink  . Drug use: No    Frequency: 2.0 times per week     Allergies   Patient has no known allergies.   Review of Systems Review of Systems  Constitutional: Negative for appetite change, diaphoresis and fatigue.  HENT: Negative for congestion, ear discharge and sinus pressure.   Eyes: Negative for discharge.  Respiratory: Negative for cough.   Cardiovascular: Positive for palpitations. Negative for chest pain.  Gastrointestinal: Negative for abdominal pain and diarrhea.  Genitourinary: Negative for frequency and hematuria.  Musculoskeletal: Negative for back pain.  Skin: Negative for rash.  Neurological: Negative for seizures and headaches.  Psychiatric/Behavioral: Negative for hallucinations.     Physical Exam Updated Vital Signs BP 127/67   Pulse (!) 141   Temp 97.9 F (36.6 C) (Oral)   Resp 19   Ht 5\' 9"  (1.753 m)   Wt 90.7 kg   SpO2 93%   BMI 29.53 kg/m   Physical Exam  Constitutional: He is oriented to person, place, and time. He appears well-developed.  HENT:  Head: Normocephalic.  Eyes: Conjunctivae and EOM are normal. No scleral icterus.  Neck: Neck  supple. No thyromegaly present.  Cardiovascular: Exam reveals no gallop and no friction rub.  No murmur heard. Patient with a rapid irregular heartbeat  Pulmonary/Chest: No stridor. He has no wheezes. He has no rales. He exhibits no tenderness.  Abdominal: He exhibits no distension. There is no tenderness. There is no rebound.  Musculoskeletal: Normal range of motion. He exhibits no edema.  Lymphadenopathy:    He has no cervical adenopathy.  Neurological: He is oriented to person, place, and time. He exhibits normal muscle tone. Coordination normal.  Skin: No rash noted. No erythema.  Psychiatric: He has a normal mood and affect. His behavior is normal.     ED Treatments / Results  Labs (all labs ordered are listed, but only abnormal results are displayed) Labs Reviewed  CBC WITH DIFFERENTIAL/PLATELET - Abnormal; Notable  for the following components:      Result Value   RBC 4.21 (*)    MCV 103.3 (*)    MCH 34.2 (*)    Platelets 140 (*)    All other components within normal limits  COMPREHENSIVE METABOLIC PANEL - Abnormal; Notable for the following components:   Glucose, Bld 108 (*)    All other components within normal limits  TROPONIN I  I-STAT TROPONIN, ED    EKG None  Radiology Dg Chest Portable 1 View  Result Date: 01/05/2018 CLINICAL DATA:  Cardiac arrhythmia EXAM: PORTABLE CHEST 1 VIEW COMPARISON:  October 02, 2015 FINDINGS: There is no edema or consolidation. Heart is mildly enlarged with pulmonary vascularity normal. No adenopathy. There is aortic atherosclerosis. No pneumothorax. No bone lesions. IMPRESSION: Stable cardiac prominence. No edema or consolidation. There is aortic atherosclerosis. Aortic Atherosclerosis (ICD10-I70.0). Electronically Signed   By: Lowella Grip III M.D.   On: 01/05/2018 10:30    Procedures Procedures (including critical care time)  Medications Ordered in ED Medications  diltiazem (CARDIZEM) injection 10 mg (10 mg Intravenous Given  01/05/18 1013)  dilTIAZem HCl-Dextrose 100-5 MG/100ML-% infusion SOLN (  Stopped 01/05/18 1010)  diltiazem (CARDIZEM) 100 mg in dextrose 5% 141mL (1 mg/mL) infusion (5 mg/hr Intravenous New Bag/Given 01/05/18 1019)     Initial Impression / Assessment and Plan / ED Course  I have reviewed the triage vital signs and the nursing notes.  Pertinent labs & imaging results that were available during my care of the patient were reviewed by me and considered in my medical decision making (see chart for details). CRITICAL CARE Performed by: Milton Ferguson Total critical care time: 40 minutes Critical care time was exclusive of separately billable procedures and treating other patients. Critical care was necessary to treat or prevent imminent or life-threatening deterioration. Critical care was time spent personally by me on the following activities: development of treatment plan with patient and/or surrogate as well as nursing, discussions with consultants, evaluation of patient's response to treatment, examination of patient, obtaining history from patient or surrogate, ordering and performing treatments and interventions, ordering and review of laboratory studies, ordering and review of radiographic studies, pulse oximetry and re-evaluation of patient's condition.     With rapid atrial fib.  He responded to Cardizem IV.  He will be admitted to medicine and is on a Cardizem drip  Final Clinical Impressions(s) / ED Diagnoses   Final diagnoses:  Atrial fibrillation with RVR [facility])    ED Discharge Orders    None       Milton Ferguson, MD 01/05/18 1123

## 2018-01-05 NOTE — Patient Instructions (Signed)
Patient to go to the emergency room because of rapid heart rate. If you are admitted to the hospital will arrange for esophagogastroduodenoscopy and stool collection while you are in the hospital.

## 2018-01-06 ENCOUNTER — Encounter (HOSPITAL_COMMUNITY)
Admission: EM | Disposition: A | Discharge status: Home / Self Care | Payer: Self-pay | Source: Home / Self Care | Attending: Family Medicine

## 2018-01-06 ENCOUNTER — Inpatient Hospital Stay (HOSPITAL_COMMUNITY): Payer: Medicare HMO

## 2018-01-06 DIAGNOSIS — K228 Other specified diseases of esophagus: Secondary | ICD-10-CM

## 2018-01-06 DIAGNOSIS — I4891 Unspecified atrial fibrillation: Secondary | ICD-10-CM

## 2018-01-06 DIAGNOSIS — Z01818 Encounter for other preprocedural examination: Secondary | ICD-10-CM

## 2018-01-06 DIAGNOSIS — R197 Diarrhea, unspecified: Secondary | ICD-10-CM

## 2018-01-06 DIAGNOSIS — I4892 Unspecified atrial flutter: Secondary | ICD-10-CM

## 2018-01-06 DIAGNOSIS — Z7189 Other specified counseling: Secondary | ICD-10-CM

## 2018-01-06 DIAGNOSIS — E785 Hyperlipidemia, unspecified: Secondary | ICD-10-CM

## 2018-01-06 DIAGNOSIS — I10 Essential (primary) hypertension: Secondary | ICD-10-CM

## 2018-01-06 HISTORY — PX: ESOPHAGOGASTRODUODENOSCOPY: SHX5428

## 2018-01-06 HISTORY — PX: BIOPSY: SHX5522

## 2018-01-06 LAB — CBC
HCT: 36.9 % — ABNORMAL LOW (ref 39.0–52.0)
Hemoglobin: 12.1 g/dL — ABNORMAL LOW (ref 13.0–17.0)
MCH: 34.1 pg — AB (ref 26.0–34.0)
MCHC: 32.8 g/dL (ref 30.0–36.0)
MCV: 103.9 fL — AB (ref 80.0–100.0)
NRBC: 0 % (ref 0.0–0.2)
PLATELETS: 121 10*3/uL — AB (ref 150–400)
RBC: 3.55 MIL/uL — ABNORMAL LOW (ref 4.22–5.81)
RDW: 12.3 % (ref 11.5–15.5)
WBC: 6.2 10*3/uL (ref 4.0–10.5)

## 2018-01-06 LAB — BASIC METABOLIC PANEL
Anion gap: 7 (ref 5–15)
BUN: 15 mg/dL (ref 8–23)
CALCIUM: 8.9 mg/dL (ref 8.9–10.3)
CO2: 24 mmol/L (ref 22–32)
CREATININE: 0.93 mg/dL (ref 0.61–1.24)
Chloride: 109 mmol/L (ref 98–111)
GFR calc non Af Amer: >60 mL/min (ref >60)
GLUCOSE: 112 mg/dL — AB (ref 70–99)
Potassium: 3.4 mmol/L — ABNORMAL LOW (ref 3.5–5.1)
Sodium: 140 mmol/L (ref 135–145)

## 2018-01-06 LAB — MAGNESIUM: Magnesium: 2 mg/dL (ref 1.7–2.4)

## 2018-01-06 LAB — ECHOCARDIOGRAM COMPLETE
Height: 69 in
Weight: 2578.5 oz

## 2018-01-06 LAB — HEPARIN LEVEL (UNFRACTIONATED)
HEPARIN UNFRACTIONATED: 0.62 [IU]/mL (ref 0.30–0.70)
Heparin Unfractionated: 0.62 IU/mL (ref 0.30–0.70)

## 2018-01-06 LAB — TROPONIN I

## 2018-01-06 SURGERY — EGD (ESOPHAGOGASTRODUODENOSCOPY)
Anesthesia: Moderate Sedation

## 2018-01-06 MED ORDER — LIDOCAINE VISCOUS HCL 2 % MT SOLN
OROMUCOSAL | Status: AC
  Filled 2018-01-06: qty 15

## 2018-01-06 MED ORDER — SODIUM CHLORIDE 0.9 % IV SOLN
INTRAVENOUS | Status: DC
  Administered 2018-01-06: 1000 mL via INTRAVENOUS

## 2018-01-06 MED ORDER — MIDAZOLAM HCL 5 MG/5ML IJ SOLN
INTRAMUSCULAR | Status: DC | PRN
  Administered 2018-01-06: 1 mg via INTRAVENOUS
  Administered 2018-01-06: 2 mg via INTRAVENOUS

## 2018-01-06 MED ORDER — MIDAZOLAM HCL 5 MG/5ML IJ SOLN
INTRAMUSCULAR | Status: AC
  Filled 2018-01-06: qty 10

## 2018-01-06 MED ORDER — POTASSIUM CHLORIDE CRYS ER 20 MEQ PO TBCR
40.0000 meq | EXTENDED_RELEASE_TABLET | Freq: Once | ORAL | Status: AC
  Administered 2018-01-06: 40 meq via ORAL
  Filled 2018-01-06 (×2): qty 2

## 2018-01-06 MED ORDER — MEPERIDINE HCL 50 MG/ML IJ SOLN
INTRAMUSCULAR | Status: AC
  Filled 2018-01-06: qty 1

## 2018-01-06 MED ORDER — MEPERIDINE HCL 50 MG/ML IJ SOLN
INTRAMUSCULAR | Status: DC | PRN
  Administered 2018-01-06: 25 mg via INTRAVENOUS

## 2018-01-06 MED ORDER — STERILE WATER FOR IRRIGATION IR SOLN
Status: DC | PRN
  Administered 2018-01-06: 1.5 mL

## 2018-01-06 MED ORDER — LIDOCAINE VISCOUS HCL 2 % MT SOLN
OROMUCOSAL | Status: DC | PRN
  Administered 2018-01-06: 1 via OROMUCOSAL

## 2018-01-06 NOTE — Consult Note (Addendum)
Cardiology Consult    Patient ID: Juan Quinn; [id number]; [birth year]   Admit date: 01/05/2018 Date of Consult: 01/06/2018  Primary Care Provider: Lucia Gaskins, MD Primary Cardiologist: New to [facility] - Dr. Bronson Ing  Patient Profile    Juan Quinn is a 84 y.o. male with past medical history of HTN, HLD, and chronic diarrhea (being followed by GI) who is being seen today for the evaluation of new-onset atrial flutter at the request of Dr. Manuella Ghazi.   History of Present Illness    [name] has been followed closely by GI over the past several months due to chronic diarrhea. He has undergone multiple tests over the past several months with no definitive diagnosis noted. It is suspected that he has malabsorptive syndrome and was planning to undergo EGD later today. While at his visit yesterday, he was noted to be tachycardic with heart rate in the 140's, therefore he was referred to [facility] ED.  Upon arrival to the ED, he was found to be in presumed new-onset atrial fibrillation with RVR. He was started on IV Cardizem for rate control and heparin for anticoagulation. Initial labs showed WBC 6.6, Hgb 14.4, platelets 140, Na+ 140, K+ 3.5, creatinine 0.90. Initial and cyclic troponin values have been negative. TSH 1.722. Magnesium 2.0. CXR showed stable cardiac prominence with no evidence of edema or consolidation. Was noted to have aortic atherosclerosis. Initial EKG shows atrial flutter with 2:1 block, HR 142.  He converted to normal sinus rhythm yesterday evening and has maintained normal sinus rhythm since with heart rate in the mid 50's to 70's. He is no longer on any AV nodal blocking agents.  Heparin was held at 6 AM this morning with plans for his upcoming EGD later today.  In talking with the patient today, he is unaware of any history of atrial fibrillation or flutter but does report having intermittent palpitations over the past several years. He was unaware of his arrhythmia upon  admission yesterday. In talking with Dr. Laural Golden, the patient had a syncopal episode this summer and was noted to be tachycardic with heart rate at 146 bpm by review of the ED provider notes on 09/09/2017.  Unfortunately, an EKG was not obtained that time.  The patient denies any recent chest pain, dyspnea on exertion, orthopnea, PND, or lower extremity edema.  He does have frequent issues with chronic diarrhea as noted above.  Past Medical History:  Diagnosis Date  . Adenomatous colon polyp 2000   Surveillance due 2017  . Helicobacter pylori antibody positive 2015   treatment with Prevpac  . HTN (hypertension)   . Hypercholesteremia   . Squamous cell carcinoma     Past Surgical History:  Procedure Laterality Date  . BIOPSY  10/24/2016   Procedure: BIOPSY;  Surgeon: Daneil Dolin, MD;  [facility];  Service: Endoscopy;;  colon  . COLONOSCOPY  June 2012   Dr. Gala Romney: adenomatous polyps, due for surveillance if health permits in 0017  . COLONOSCOPY N/A 10/24/2016   Procedure: COLONOSCOPY;  Surgeon: Daneil Dolin, MD;  Location[facility];  Service: Endoscopy;  Laterality: N/A;  2:00pm  . ESOPHAGOGASTRODUODENOSCOPY N/A 05/24/2013   Dr. Rourk:Prepyloric gastric ulcerations-likely source of hematemesis-likely NSAID-related. H.PYLORI SEROLOGY POSITIVE, TREATED WITH PREVPAC  . ESOPHAGOGASTRODUODENOSCOPY N/A 05/23/2013   [facility]:[id number] EGD secondary to much clot and food debris in the stomach  . ESOPHAGOGASTRODUODENOSCOPY N/A 08/16/2013   Dr. Gala Romney: normal esophagus, stomach empty, deformity of antrum. scar  present. Previously noted ulcers completed healed. Patent pylorus, normal D1 and D2  . HEMORRHOID SURGERY    . POLYPECTOMY  10/24/2016   Procedure: POLYPECTOMY;  Surgeon: Daneil Dolin, MD;  [facility];  Service: Endoscopy;;  colon  . shoulder       Home Medications:  Prior to Admission medications   Medication Sig Start Date End Date Taking? Authorizing  Provider  allopurinol (ZYLOPRIM) 300 MG tablet Take 300 mg by mouth daily.   Yes [provider]  amLODipine (NORVASC) 5 MG tablet Take 5 mg by mouth daily. 07/20/16  Yes [provider]  Cholecalciferol (VITAMIN D3) 2000 units TABS Take 1 capsule by mouth 2 (two) times daily.    Yes [provider]  fish oil-omega-3 fatty acids 1000 MG capsule Take 1 g by mouth daily.     Yes [provider]  ipratropium (ATROVENT) 0.06 % nasal spray Place 2 sprays into both nostrils daily as needed for rhinitis.  04/29/13  Yes [provider]  Polyethyl Glycol-Propyl Glycol (SYSTANE OP) Apply 1 drop to eye 4 (four) times daily as needed (dry eyes).   Yes [provider]  simvastatin (ZOCOR) 40 MG tablet Take 40 mg by mouth daily.    Yes [provider]  terazosin (HYTRIN) 5 MG capsule Take 5 mg by mouth daily.    Yes [provider]  vitamin B-12 (CYANOCOBALAMIN) 1000 MCG tablet Take 1,000 mcg by mouth daily. Patient states that he takes 3000 mcg daily   Yes [provider]    Inpatient Medications: Scheduled Meds: . allopurinol  300 mg Oral Daily  . cholecalciferol  2,000 Units Oral BID  . omega-3 acid ethyl esters  1 g Oral Daily  . simvastatin  40 mg Oral Daily  . sodium chloride flush  3 mL Intravenous Q12H  . terazosin  5 mg Oral Daily  . vitamin B-12  1,000 mcg Oral Daily   Continuous Infusions: . sodium chloride    . heparin Stopped (01/06/18 0706)   PRN Meds: sodium chloride, acetaminophen **OR** acetaminophen, ondansetron **OR** ondansetron (ZOFRAN) IV, sodium chloride flush  Allergies:   No Known Allergies  Social History:   Social History   Socioeconomic History  . Marital status: Married    Spouse name: Not on file  . Number of children: 4  . Years of education: Not on file  . Highest education level: Not on file  Occupational History  . Occupation: retired    Comment: Geographical information systems officer  .  Financial resource strain: Not on file  . Food insecurity:    Worry: Not on file    Inability: Not on file  . Transportation needs:    Medical: Not on file    Non-medical: Not on file  Tobacco Use  . Smoking status: Former Smoker    Packs/day: 2.00    Years: 50.00    Pack years: 100.00    Types: Cigarettes    Last attempt to quit: 07/28/2000    Years since quitting: 17.4  . Smokeless tobacco: Former Systems developer    Quit date: 02/25/1999  Substance and Sexual Activity  . Alcohol use: No    Comment: couple glasses wine/week, occ beer or rmixed drink  . Drug use: No    Frequency: 2.0 times per week  . Sexual activity: Not on file  Lifestyle  . Physical activity:    Days per week: Not on file    Minutes per session:  Not on file  . Stress: Not on file  Relationships  . Social connections:    Talks on phone: Not on file    Gets together: Not on file    Attends religious service: Not on file    Active member of club or organization: Not on file    Attends meetings of clubs or organizations: Not on file    Relationship status: Not on file  . Intimate partner violence:    Fear of current or ex partner: Not on file    Emotionally abused: Not on file    Physically abused: Not on file    Forced sexual activity: Not on file  Other Topics Concern  . Not on file  Social History Narrative   Lives w/ wife           Family History:    Family History  Problem Relation Age of Onset  . Stroke Mother   . Stroke Father   . Pancreatic cancer Sister   . Leukemia Brother   . Lung cancer Sister   . Colon cancer Neg Hx       Review of Systems    General:  No chills, fever, night sweats or weight changes.  Cardiovascular:  No chest pain, dyspnea on exertion, edema, orthopnea, palpitations, paroxysmal nocturnal dyspnea. Dermatological: No rash, lesions/masses Respiratory: No cough, dyspnea Urologic: No hematuria, dysuria Abdominal:   No nausea, vomiting, bright red blood per rectum,  melena, or hematemesis. Positive for diarrhea.  Neurologic:  No visual changes, wkns, changes in mental status.  All other systems reviewed and are otherwise negative except as noted above.  Physical Exam/Data    Vitals:   01/06/18 0700 01/06/18 0737 01/06/18 0933 01/06/18 1110  BP: (!) 105/56  134/64   Pulse: (!) 55 67  87  Resp: 15 17    Temp:  97.8 F (36.6 C)  98.4 F (36.9 C)  TempSrc:  Oral  Oral  SpO2: 95% 97%  93%  Weight:      Height:        Intake/Output Summary (Last 24 hours) at 01/06/2018 1113 Last data filed at 01/06/2018 0935 Gross per 24 hour  Intake 560.53 ml  Output 1525 ml  Net -964.47 ml   Filed Weights   01/05/18 0941 01/05/18 1655 01/06/18 0500  Weight: 90.7 kg 91.3 kg 73.1 kg   Body mass index is 23.8 kg/m.   General: Pleasant, Caucasian male appearing in NAD Psych: Normal affect. Neuro: Alert and oriented X 3. Moves all extremities spontaneously. HEENT: Normal  Neck: Supple without bruits or JVD. Lungs:  Resp regular and unlabored, CTA without wheezing. Heart: RRR no s3, s4, or murmurs. Abdomen: Soft, non-tender, non-distended, BS + x 4.  Extremities: No clubbing, cyanosis or edema. DP/PT/Radials 2+ and equal bilaterally.   Labs/Studies     Relevant CV Studies:  Echocardiogram: Pending  Laboratory Data:  Chemistry Recent Labs  Lab 01/05/18 1001 01/06/18 0354  NA 140 140  K 3.5 3.4*  CL 107 109  CO2 26 24  GLUCOSE 108* 112*  BUN 15 15  CREATININE 0.90 0.93  CALCIUM 9.3 8.9  GFRNONAA >60 >60  GFRAA >60 >60  ANIONGAP 7 7    Recent Labs  Lab 01/05/18 1001  PROT 6.8  ALBUMIN 4.0  AST 17  ALT 13  ALKPHOS 63  BILITOT 1.0   Hematology Recent Labs  Lab 01/05/18 1001 01/06/18 0354  WBC 6.6 6.2  RBC 4.21* 3.55*  HGB 14.4  12.1*  HCT 43.5 36.9*  MCV 103.3* 103.9*  MCH 34.2* 34.1*  MCHC 33.1 32.8  RDW 12.3 12.3  PLT 140* 121*   Cardiac Enzymes Recent Labs  Lab 01/05/18 1001 01/05/18 1540 01/05/18 2114  01/06/18 0354  TROPONINI <0.03 <0.03 <0.03 <0.03    Recent Labs  Lab 01/05/18 0957  TROPIPOC 0.01    BNPNo results for input(s): BNP, PROBNP in the last 168 hours.  DDimer No results for input(s): DDIMER in the last 168 hours.  Radiology/Studies:  Dg Chest Portable 1 View  Result Date: 01/05/2018 CLINICAL DATA:  Cardiac arrhythmia EXAM: PORTABLE CHEST 1 VIEW COMPARISON:  October 02, 2015 FINDINGS: There is no edema or consolidation. Heart is mildly enlarged with pulmonary vascularity normal. No adenopathy. There is aortic atherosclerosis. No pneumothorax. No bone lesions. IMPRESSION: Stable cardiac prominence. No edema or consolidation. There is aortic atherosclerosis. Aortic Atherosclerosis (ICD10-I70.0). Electronically Signed   By: Lowella Grip III M.D.   On: 01/05/2018 10:30     Assessment & Plan    1. New-Onset Atrial Flutter with RVR - was found to be tachycardiac while at his outpatient GI visit and was overall asymptomatic with this. EKG upon arrival to the ED showed atrial flutter with 2:1 block, HR 142. Initial labs showed WBC 6.6, Hgb 14.4, platelets 140, Na+ 140, K+ 3.5, creatinine 0.90. Initial and cyclic troponin values have been negative. TSH 1.722. Magnesium 2.0.  - was started on IV Cardizem and has now converted to NSR. He reports a history of bradycardia but also mentions episodes of palpitations in the past. HR was also elevated into the 150's in 08/2017 but an EKG was not obtained. Would follow on telemetry now back in SR and determine if Amlodipine should be switched to Cardizem CD prior to discharge.  - This patients CHA2DS2-VASc Score and unadjusted Ischemic Stroke Rate (% per year) is equal to 4.8 % stroke rate/year from a score of 4 (HTN, Aortic Atherosclerosis, Age (2)). Currently on Heparin. Would anticipate switching to Eliquis 5mg  BID prior to discharge.   2. HTN - BP has been soft at 102/52 - 134/85 within the past 24 hours.  - PTA Amlodipine has been  held. Would continue to follow HR now that he is back in sinus rhythm. Could switch to Cardizem CD at the time of discharge if HR allows.  3. Cardiac Clearance for Upcoming EGD - He denies any recent chest pain or dyspnea on exertion. Cardiac enzymes have been negative. Echo is pending but this should not delay plans for upcoming EGD. No indication for ischemic testing prior to this. Would plan to start anticoagulation for #1 once safe from GI perspective and no more procedures planned this admission.    For questions or updates, please contact [place] Please consult [email or web address] for contact info under Cardiology/STEMI.  Signed, Erma Heritage, PA-C 01/06/2018, 11:13 AM Pager: [phone]  The patient was seen and examined, and I agree with the history, physical exam, assessment and plan as documented above, with modifications as noted below. I have also personally reviewed all relevant documentation, old records, labs, and both radiographic and cardiovascular studies. I have also independently interpreted old and new ECG's.  Briefly, this is an 84 year old gentleman with no significant cardiac history who is being evaluated by GI for chronic diarrhea.  It is suspected that he has malabsorptive syndrome and an EGD is planned for today.  At his GI visit yesterday, he was noted to be tachycardic and was sent  to the [facility] ED.    He was found to be in new onset rapid atypical atrial flutter with 2:1 conduction.    He was started on IV diltiazem and heparin.  He subsequently converted to sinus rhythm yesterday evening and has been maintaining normal sinus rhythm since.  While he described palpitations to other providers, he denied any awareness of his tachycardia to me.  He also denies exertional chest pain, shortness of breath, and a prior history of palpitations.  He does complain of dizziness when bending over.  He did have some dizziness and a fall in July 2019  when he was evaluated in the ED.  No ECG was obtained at that time.  TSH and troponins are normal.  He can safely proceed with EGD.  Ideally, amlodipine can be switched to low-dose diltiazem at the time of discharge if heart rate and blood pressure allow.  He will also need systemic anticoagulation with apixaban 5 mg twice daily for thromboembolic risk reduction. I discussed this strategy with the patient and his wife and they are in agreement with this plan. An echocardiogram has been ordered and is pending at the time of this dictation.  This should not preclude EGD.   Kate Sable, MD, [facility]  01/06/2018 12:43 PM

## 2018-01-06 NOTE — Op Note (Signed)
[facility] Patient Name: Juan Quinn Procedure Date: 01/06/2018 3:15 PM MRN: [id number] Date of Birth: 1930-07-23 Attending MD: Hildred Laser , MD CSN: [id number] Age: 84 Admit Type: Inpatient Procedure:                Upper GI endoscopy Indications:              Endoscopy to assess diarrhea in patient suspected                            of having disease of the small-bowel Providers:                Hildred Laser, MD, Janeece Riggers, RN, Charlsie Quest.                            Theda Sers RN, RN Referring MD:             Ralene Bathe. Dondiego, MD Medicines:                Lidocaine spray, Meperidine 25 mg IV, Midazolam 3                            mg IV Complications:            No immediate complications. Estimated Blood Loss:     Estimated blood loss was minimal. Procedure:                Pre-Anesthesia Assessment:                           - Prior to the procedure, a History and Physical                            was performed, and patient medications and                            allergies were reviewed. The patient's tolerance of                            previous anesthesia was also reviewed. The risks                            and benefits of the procedure and the sedation                            options and risks were discussed with the patient.                            All questions were answered, and informed consent                            was obtained. Prior Anticoagulants: The patient                            last took heparin on the day of the procedure. ASA  Grade Assessment: III - A patient with severe                            systemic disease. After reviewing the risks and                            benefits, the patient was deemed in satisfactory                            condition to undergo the procedure.                           After obtaining informed consent, the endoscope was                            passed under direct  vision. Throughout the                            procedure, the patient's blood pressure, pulse, and                            oxygen saturations were monitored continuously. The                            [id number]-[id number] ([id number]) scope was introduced through the                            mouth, and advanced to the second part of duodenum.                            The upper GI endoscopy was accomplished without                            difficulty. The patient tolerated the procedure                            well. Scope In: 3:28:47 PM Scope Out: 3:36:23 PM Total Procedure Duration: 0 hours 7 minutes 36 seconds  Findings:      The examined esophagus was normal.      The Z-line was irregular and was found 40 cm from the incisors.      The entire examined stomach was normal.      The duodenal bulb and second portion of the duodenum were normal.       Biopsies were taken with a cold forceps for histology. Impression:               - Normal esophagus.                           - Z-line irregular, 40 cm from the incisors.                           - Normal stomach.                           -  Normal duodenal bulb and second portion of the                            duodenum. Biopsied. Moderate Sedation:      Moderate (conscious) sedation was administered by the endoscopy nurse       and supervised by the endoscopist. The following parameters were       monitored: oxygen saturation, heart rate, blood pressure, CO2       capnography and response to care. Total physician intraservice time was       12 minutes. Recommendation:           - Return patient to ICU for ongoing care.                           - Low sodium diet today.                           - Continue present medications.                           - Await pathology results.                           - Begin Heparin in am. Procedure Code(s):        --- Professional ---                           [phone], Esophagogastroduodenoscopy,  flexible,                            transoral; with biopsy, single or multiple                           G0500, Moderate sedation services provided by the                            same physician or other qualified health care                            professional performing a gastrointestinal                            endoscopic service that sedation supports,                            requiring the presence of an independent trained                            observer to assist in the monitoring of the                            patient's level of consciousness and physiological                            status; initial 15 minutes of intra-service time;  patient age 30 years or older (additional time may                            be reported with [phone], as appropriate) Diagnosis Code(s):        --- Professional ---                           K22.8, Other specified diseases of esophagus                           R19.7, Diarrhea, unspecified CPT copyright 2018 American Medical Association. All rights reserved. The codes documented in this report are preliminary and upon coder review may  be revised to meet current compliance requirements. Hildred Laser, MD Hildred Laser, MD 01/06/2018 3:44:38 PM This report has been signed electronically. Number of Addenda: 0

## 2018-01-06 NOTE — Progress Notes (Signed)
*  PRELIMINARY RESULTS* Echocardiogram 2D Echocardiogram has been performed.  Leavy Cella 01/06/2018, 1:23 PM

## 2018-01-06 NOTE — Progress Notes (Signed)
Patient ID: Juan Quinn, male   DOB: 1931/02/07, 84 y.o.   MRN: [id number] States he feels better. HR in the 60s. Troponin's are normal. [place] cardiology recommendations.

## 2018-01-06 NOTE — Progress Notes (Signed)
PROGRESS NOTE  Juan Quinn  WYO:[id number] DOB: February 28, 1930 DOA: 01/05/2018 PCP: Lucia Gaskins, MD  Outpatient Specialists: GI, Dr. Laural Golden Brief Narrative: Juan Quinn is an 84 y.o. male with a history of HTN, dyslipidemia, gout, and chronic diarrhea who was sent to the ED after finding rapid irregular HR at GI office. He has had mild palpitations but no other symptoms the he is aware of. He was bolused with diltiazem for atrial flutter on ECG, started on infusion with improvement in heart rate. He ultimately converted to NSR the morning after admission and was started on po diltiazem, cardiology consulted. For continued work up of chronic diarrhea, GI plans EGD pending cardiology clearance. Echocardiogram is pending.  Assessment & Plan: Principal Problem:   Atrial fibrillation with RVR (HCC) Active Problems:   HTN (hypertension)   Hyperlipidemia   Diarrhea  New-onset AFib with RVR:  - CHA2DS2-VASc score of 4, will need anticoagulation. Cardiology recommending eliquis 5mg  po BID (>60kg and SCr < 1.5). Defer timing to GI. - Converted to po diltiazem, will continue for now per cardiology recommendations.  - Echo pending - Monitor K, Mg, maintain above 4 and 2 respectively.  Chronic diarrhea:  - EGD today per GI - Post-procedure diet per GI  HTN:  - Holding amlodipine to make BP room for diltiazem, will likely DC norvasc altogether.   Dyslipidemia:  - Continue statin, fish oil.  Gout: Chronic, no flare.  - Continue ULT  BPH: No retention noted.  - Continue terazosin.  DVT prophylaxis: Heparin Code Status: Full Family Communication: None at bedside Disposition Plan: Home once work up complete, stable HR  Consultants:   Cardiology, Dr. Bronson Ing  GI, Dr. Laural Golden  Procedures:   EGD 01/06/2018 by Dr. Laural Golden  Antimicrobials:  None   Subjective: Feels well, no chest pain, dyspnea, palpitations, lightheadedness. Having his usual diarrhea stools, no  bleeding.  Objective: Vitals:   01/06/18 0933 01/06/18 1110 01/06/18 1424 01/06/18 1521  BP: 134/64  140/66   Pulse:  87 83   Resp:   19   Temp:  98.4 F (36.9 C) 97.9 F (36.6 C)   TempSrc:  Oral Oral   SpO2:  93% 97% 100%  Weight:      Height:        Intake/Output Summary (Last 24 hours) at 01/06/2018 1529 Last data filed at 01/06/2018 0935 Gross per 24 hour  Intake 560.53 ml  Output 1525 ml  Net -964.47 ml   Filed Weights   01/05/18 0941 01/05/18 1655 01/06/18 0500  Weight: 90.7 kg 91.3 kg 73.1 kg    Gen: 84 y.o. male in no distress Pulm: Non-labored breathing room air. Clear to auscultation bilaterally.  CV: Regular rate and rhythm, in 70's. No murmur, rub, or gallop. No JVD, no pedal edema. GI: Abdomen soft, non-tender, non-distended, with normoactive bowel sounds. No organomegaly or masses felt. Ext: Warm, no deformities Skin: No rashes, lesions or ulcers Neuro: Alert and oriented. No focal neurological deficits. Psych: Judgement and insight appear normal. Mood & affect appropriate.   Data Reviewed: I have personally reviewed following labs and imaging studies  CBC: Recent Labs  Lab 01/05/18 1001 01/06/18 0354  WBC 6.6 6.2  NEUTROABS 3.3  --   HGB 14.4 12.1*  HCT 43.5 36.9*  MCV 103.3* 103.9*  PLT 140* 774*   Basic Metabolic Panel: Recent Labs  Lab 01/05/18 1001 01/06/18 0354  NA 140 140  K 3.5 3.4*  CL 107 109  CO2 26 24  GLUCOSE 108* 112*  BUN 15 15  CREATININE 0.90 0.93  CALCIUM 9.3 8.9  MG  --  2.0   GFR: Estimated Creatinine Clearance: 58.1 mL/min (by C-G formula based on SCr of 0.93 mg/dL). Liver Function Tests: Recent Labs  Lab 01/05/18 1001  AST 17  ALT 13  ALKPHOS 63  BILITOT 1.0  PROT 6.8  ALBUMIN 4.0   No results for input(s): LIPASE, AMYLASE in the last 168 hours. No results for input(s): AMMONIA in the last 168 hours. Coagulation Profile: No results for input(s): INR, PROTIME in the last 168 hours. Cardiac  Enzymes: Recent Labs  Lab 01/05/18 1001 01/05/18 1540 01/05/18 2114 01/06/18 0354  TROPONINI <0.03 <0.03 <0.03 <0.03   BNP (last 3 results) No results for input(s): PROBNP in the last 8760 hours. HbA1C: No results for input(s): HGBA1C in the last 72 hours. CBG: No results for input(s): GLUCAP in the last 168 hours. Lipid Profile: No results for input(s): CHOL, HDL, LDLCALC, TRIG, CHOLHDL, LDLDIRECT in the last 72 hours. Thyroid Function Tests: Recent Labs    01/05/18 1540  TSH 1.722   Anemia Panel: No results for input(s): VITAMINB12, FOLATE, FERRITIN, TIBC, IRON, RETICCTPCT in the last 72 hours. Urine analysis: No results found for: COLORURINE, APPEARANCEUR, [place], [place], [place], [place], [place], [place], [place], [place], NITRITE, LEUKOCYTESUR Recent Results (from the past 240 hour(s))  MRSA PCR Screening     Status: None   Collection Time: 01/05/18  4:48 PM  Result Value Ref Range Status   MRSA by PCR NEGATIVE NEGATIVE Final    Comment:        The GeneXpert MRSA Assay (FDA approved for NASAL specimens only), is one component of a comprehensive MRSA colonization surveillance program. It is not intended to diagnose MRSA infection nor to guide or monitor treatment for MRSA infections. Performed at [facility], [address], [place], [place] [address]       Radiology Studies: Dg Chest Portable 1 View  Result Date: 01/05/2018 CLINICAL DATA:  Cardiac arrhythmia EXAM: PORTABLE CHEST 1 VIEW COMPARISON:  October 02, 2015 FINDINGS: There is no edema or consolidation. Heart is mildly enlarged with pulmonary vascularity normal. No adenopathy. There is aortic atherosclerosis. No pneumothorax. No bone lesions. IMPRESSION: Stable cardiac prominence. No edema or consolidation. There is aortic atherosclerosis. Aortic Atherosclerosis (ICD10-I70.0). Electronically Signed   By: Lowella Grip III M.D.   On: 01/05/2018 10:30    Scheduled Meds: . [MAR Hold]  allopurinol  300 mg Oral Daily  . [MAR Hold] cholecalciferol  2,000 Units Oral BID  . lidocaine      . meperidine      . midazolam      . [MAR Hold] omega-3 acid ethyl esters  1 g Oral Daily  . [MAR Hold] simvastatin  40 mg Oral Daily  . [MAR Hold] sodium chloride flush  3 mL Intravenous Q12H  . [MAR Hold] terazosin  5 mg Oral Daily  . [MAR Hold] vitamin B-12  1,000 mcg Oral Daily   Continuous Infusions: . [MAR Hold] sodium chloride    . sodium chloride 1,000 mL (01/06/18 1442)  . heparin Stopped (01/06/18 0706)     LOS: 1 day   Time spent: 35 minutes.  Patrecia Pour, MD [facility] [email or web address] Password [email or web address] 01/06/2018, 3:29 PM

## 2018-01-06 NOTE — Progress Notes (Signed)
Brief EGD note:  Normal EGD. Random biopsies taken from post bulbar duodenum.

## 2018-01-06 NOTE — Progress Notes (Signed)
ANTICOAGULATION CONSULT NOTE   Pharmacy Consult for heparin Indication: atrial fibrillation  No Known Allergies  Patient Measurements: Height: 5\' 9"  (175.3 cm) Weight: 201 lb 4.5 oz (91.3 kg) IBW/kg (Calculated) : 70.7 Heparin Dosing Weight: 90 kg  Vital Signs: Temp: 98.2 F (36.8 C) (11/12 1714) Temp Source: Oral (11/12 1714) BP: 120/57 (11/13 0000) Pulse Rate: 57 (11/13 0000)  Labs: Recent Labs    01/05/18 1001 01/05/18 1540 01/05/18 2114 01/06/18 0007  HGB 14.4  --   --   --   HCT 43.5  --   --   --   PLT 140*  --   --   --   HEPARINUNFRC  --   --   --  0.62  CREATININE 0.90  --   --   --   TROPONINI <0.03 <0.03 <0.03  --     Estimated Creatinine Clearance: 67 mL/min (by C-G formula based on SCr of 0.9 mg/dL).   Medical History: Past Medical History:  Diagnosis Date  . Adenomatous colon polyp 2000   Surveillance due 2017  . Helicobacter pylori antibody positive 2015   treatment with Prevpac  . HTN (hypertension)   . Hypercholesteremia   . Squamous cell carcinoma     Medications:  Medications Prior to Admission  Medication Sig Dispense Refill Last Dose  . allopurinol (ZYLOPRIM) 300 MG tablet Take 300 mg by mouth daily.   01/04/2018 at Unknown time  . amLODipine (NORVASC) 5 MG tablet Take 5 mg by mouth daily.  3 01/04/2018 at Unknown time  . Cholecalciferol (VITAMIN D3) 2000 units TABS Take 1 capsule by mouth 2 (two) times daily.    01/04/2018 at Unknown time  . fish oil-omega-3 fatty acids 1000 MG capsule Take 1 g by mouth daily.     01/04/2018 at Unknown time  . ipratropium (ATROVENT) 0.06 % nasal spray Place 2 sprays into both nostrils daily as needed for rhinitis.    01/05/2018 at Unknown time  . Polyethyl Glycol-Propyl Glycol (SYSTANE OP) Apply 1 drop to eye 4 (four) times daily as needed (dry eyes).   01/04/2018 at Unknown time  . simvastatin (ZOCOR) 40 MG tablet Take 40 mg by mouth daily.    01/04/2018 at Unknown time  . terazosin (HYTRIN) 5 MG  capsule Take 5 mg by mouth daily.    01/04/2018 at Unknown time  . vitamin B-12 (CYANOCOBALAMIN) 1000 MCG tablet Take 1,000 mcg by mouth daily. Patient states that he takes 3000 mcg daily   01/04/2018 at Unknown time    Assessment: Pharmacy consulted to dose heparin in patient with atrial fibrillation.  Patient was not on anticoagulants prior to admission. Initial heparin level 0.62 units/ml  Goal of Therapy:  Heparin level 0.3-0.7 units/ml Monitor platelets by anticoagulation protocol: Yes   Plan:  Continue heparin at 1400 units/hr Daily heparin level and CBC  Juan Quinn 01/06/2018,12:28 AM

## 2018-01-06 NOTE — Progress Notes (Signed)
Per verbal orders from Dr. Laural Golden, place IV heparin on hold, order clear liquids, and wait for cardiology to clear pt for EGD today with biopsy.

## 2018-01-06 NOTE — Progress Notes (Signed)
Pt sent down for Endoscopy

## 2018-01-07 DIAGNOSIS — I4891 Unspecified atrial fibrillation: Principal | ICD-10-CM

## 2018-01-07 LAB — BASIC METABOLIC PANEL
Anion gap: 6 (ref 5–15)
BUN: 14 mg/dL (ref 8–23)
CHLORIDE: 111 mmol/L (ref 98–111)
CO2: 24 mmol/L (ref 22–32)
CREATININE: 0.88 mg/dL (ref 0.61–1.24)
Calcium: 9 mg/dL (ref 8.9–10.3)
GFR calc non Af Amer: >60 mL/min (ref >60)
Glucose, Bld: 105 mg/dL — ABNORMAL HIGH (ref 70–99)
POTASSIUM: 3.4 mmol/L — AB (ref 3.5–5.1)
SODIUM: 141 mmol/L (ref 135–145)

## 2018-01-07 LAB — CBC
HCT: 36.9 % — ABNORMAL LOW (ref 39.0–52.0)
Hemoglobin: 12.2 g/dL — ABNORMAL LOW (ref 13.0–17.0)
MCH: 34.8 pg — ABNORMAL HIGH (ref 26.0–34.0)
MCHC: 33.1 g/dL (ref 30.0–36.0)
MCV: 105.1 fL — ABNORMAL HIGH (ref 80.0–100.0)
Platelets: 117 10*3/uL — ABNORMAL LOW (ref 150–400)
RBC: 3.51 MIL/uL — ABNORMAL LOW (ref 4.22–5.81)
RDW: 12.3 % (ref 11.5–15.5)
WBC: 5.9 10*3/uL (ref 4.0–10.5)
nRBC: 0 % (ref 0.0–0.2)

## 2018-01-07 LAB — HEPARIN LEVEL (UNFRACTIONATED): Heparin Unfractionated: <0.1 IU/mL — ABNORMAL LOW (ref 0.30–0.70)

## 2018-01-07 MED ORDER — POTASSIUM CHLORIDE CRYS ER 20 MEQ PO TBCR
40.0000 meq | EXTENDED_RELEASE_TABLET | Freq: Once | ORAL | Status: AC
  Administered 2018-01-07: 40 meq via ORAL
  Filled 2018-01-07: qty 2

## 2018-01-07 MED ORDER — DILTIAZEM HCL 30 MG PO TABS: 30.0000 mg | ORAL_TABLET | Freq: Two times a day (BID) | ORAL | 0 refills | Status: DC

## 2018-01-07 MED ORDER — APIXABAN 5 MG PO TABS
5.0000 mg | ORAL_TABLET | Freq: Two times a day (BID) | ORAL | Status: DC
  Administered 2018-01-07: 5 mg via ORAL
  Filled 2018-01-07: qty 1

## 2018-01-07 MED ORDER — APIXABAN 5 MG PO TABS: 5.0000 mg | ORAL_TABLET | Freq: Two times a day (BID) | ORAL | 0 refills | Status: DC

## 2018-01-07 NOTE — Progress Notes (Signed)
Discussed with Dr. Bonner Puna. Will plan to start low dose, short acting diltiazem (perhaps 30 mg bid) if HR/BP will tolerate, along with systemic anticoagulation with apixaban 5 mg bid. Will arrange for outpatient follow up. Will sign off.

## 2018-01-07 NOTE — Discharge Instructions (Signed)

## 2018-01-07 NOTE — Progress Notes (Signed)
Discharge instructions provided to both pt and His wife. Both verbalized understanding of discharge instructions.

## 2018-01-07 NOTE — Discharge Summary (Signed)
Physician Discharge Summary  ZAMEER BORMAN FWY:[id number] DOB: [birth year] DOA: 01/05/2018  PCP: Lucia Gaskins, MD  Admit date: 01/05/2018 Discharge date: 01/07/2018  Admitted From: Home by way of GI office Disposition: Home   Recommendations for Outpatient Follow-up:  1. Follow up with PCP in 1-2 weeks. Monitor HR and rhythm closely, discharged on diltiazem and eliquis for AFib, NSR at discharge. 2. GI to follow up biopsy results from normal EGD performed by Dr. Laural Golden 11/13.  Home Health: None Equipment/Devices: None Discharge Condition: Stable CODE STATUS: Full Diet recommendation: Heart healthy  Brief/Interim Summary: Juan Quinn is an 84 y.o. male with a history of HTN, dyslipidemia, gout, and chronic diarrhea who was sent to the ED after finding rapid irregular HR at GI office. He has had mild palpitations but no other symptoms the he is aware of. He was bolused with diltiazem for atrial flutter on ECG, started on infusion with improvement in heart rate. He ultimately converted to NSR the morning after admission and was started on po diltiazem, cardiology consulted. For continued work up of chronic diarrhea, GI performed EGD on 11/13 which was normal. Biopsies taken and pending at discharge. Rate has remained well controlled and cardiology has suggested diltiazem 30mg  po BID and eliquis has been started for stroke risk reduction. He remains in stable condition and is ready for discharge.   Discharge Diagnoses:  Principal Problem:   Atrial fibrillation with RVR ([place]) Active Problems:   HTN (hypertension)   Hyperlipidemia   Diarrhea  New-onset AFib with RVR:  - CHA2DS2-VASc score of 4, started eliquis after heparin while inpatient, dosed 5mg  po BID (>60kg and SCr < 1.5).  - Converted to po diltiazem, will continue for now per cardiology recommendations.  - Echo shows preserved EF and no wall motion abnormalities. - Monitor K, Mg, maintain above 4 and 2 respectively. Given  supplements while inpatient, suggest recheck BMP at follow up.  Chronic diarrhea: Continues at baseline, pt tolerating po enough to maintain hydration, benign exam. EGD wnl.  - Per GI  HTN:  - Holding amlodipine to make BP room for diltiazem.  Dyslipidemia:  - Continue statin, fish oil.  Gout: Chronic, no flare.  - Continue ULT  BPH: No retention noted.  - Continue terazosin.  Discharge Instructions Discharge Instructions    Diet - low sodium heart healthy   Complete by:  As directed    Discharge instructions   Complete by:  As directed    you were admitted with AFib with rapid rate which has since been controlled very well. you will need to continue taking diltiazem for rate control and start taking eliquis, a blood thinner, to reduce the risk of stroke related to the AFib. Cardiology has arranged follow up for you, but you should call them or seek medical attention if you develop chest pain, trouble breathing, or palpitations.   Follow up with GI as they have directed. They will follow up with you regarding the results of the biopsies taken at EGD.   Increase activity slowly   Complete by:  As directed      Allergies as of 01/07/2018   No Known Allergies     Medication List    STOP taking these medications   amLODipine 5 MG tablet Commonly known as:  NORVASC     TAKE these medications   allopurinol 300 MG tablet Commonly known as:  ZYLOPRIM Take 300 mg by mouth daily.   apixaban 5 MG Tabs tablet Commonly known  asArne [place] Take 1 tablet (5 mg total) by mouth 2 (two) times daily.   diltiazem 30 MG tablet Commonly known as:  CARDIZEM Take 1 tablet (30 mg total) by mouth 2 (two) times daily.   fish oil-omega-3 fatty acids 1000 MG capsule Take 1 g by mouth daily.   ipratropium 0.06 % nasal spray Commonly known as:  ATROVENT Place 2 sprays into both nostrils daily as needed for rhinitis.   simvastatin 40 MG tablet Commonly known as:  ZOCOR Take 40 mg by  mouth daily.   SYSTANE OP Apply 1 drop to eye 4 (four) times daily as needed (dry eyes).   terazosin 5 MG capsule Commonly known as:  HYTRIN Take 5 mg by mouth daily.   vitamin B-12 1000 MCG tablet Commonly known as:  CYANOCOBALAMIN Take 1,000 mcg by mouth daily. Patient states that he takes 3000 mcg daily   Vitamin D3 50 MCG (2000 UT) Tabs Take 1 capsule by mouth 2 (two) times daily.      Follow-up Information    Erma Heritage, PA-C Follow up on 02/09/2018.   Specialties:  Physician Assistant, Cardiology Why:  [place] on 02/09/2018 at 3:30PM.  Contact information: [place] [address] [phone]        Lucia Gaskins, MD. Schedule an appointment as soon as possible for a visit in 1 week(s).   Specialty:  Internal Medicine Contact information: [place] [address] [phone]        Rogene Houston, MD. Schedule an appointment as soon as possible for a visit in 4 week(s).   Specialty:  Gastroenterology Contact information: [place], [address] [place] [address] [phone]          No Known Allergies  Consultations:  Cardiology  GI  Procedures/Studies: Dg Chest Portable 1 View  Result Date: 01/05/2018 CLINICAL DATA:  Cardiac arrhythmia EXAM: PORTABLE CHEST 1 VIEW COMPARISON:  October 02, 2015 FINDINGS: There is no edema or consolidation. Heart is mildly enlarged with pulmonary vascularity normal. No adenopathy. There is aortic atherosclerosis. No pneumothorax. No bone lesions. IMPRESSION: Stable cardiac prominence. No edema or consolidation. There is aortic atherosclerosis. Aortic Atherosclerosis (ICD10-I70.0). Electronically Signed   By: Lowella Grip III M.D.   On: 01/05/2018 10:30    Echocardiogram - Left ventricle: The cavity size was normal. Wall thickness was   increased in a pattern of moderate LVH. Systolic function was   vigorous. The estimated ejection fraction  was in the range of 65%   to 70%. Wall motion was normal; there were no regional wall   motion abnormalities. The study is not technically sufficient to   allow evaluation of LV diastolic function. - Aortic valve: Mildly calcified annulus. Trileaflet; mildly   thickened leaflets. Valve area (VTI): 3.46 cm^2. Valve area   (Vmax): 3.29 cm^2. - Mitral valve: Mildly calcified annulus. Mildly thickened leaflets  Subjective: No chest pain, dyspnea, palpitations. Diarrhea at baseline, no abd pain, nausea, or vomiting.  Discharge Exam: Vitals:   01/07/18 1000 01/07/18 1150  BP: (!) 111/59   Pulse: 65 71  Resp: (!) 21   Temp:  99.4 F (37.4 C)  SpO2: 92% 92%   General: Pt is alert, awake, not in acute distress Cardiovascular: RRR, S1/S2 +, no rubs, no gallops Respiratory: CTA bilaterally, no wheezing, no rhonchi Abdominal: Soft, NT, ND, bowel sounds + Extremities: No edema, no cyanosis  Labs: Basic Metabolic Panel: Recent Labs  Lab 01/05/18 1001 01/06/18 0354 01/07/18 0419  NA 140 140 141  K 3.5 3.4* 3.4*  CL 107 109 111  CO2 26 24 24   GLUCOSE 108* 112* 105*  BUN 15 15 14   CREATININE 0.90 0.93 0.88  CALCIUM 9.3 8.9 9.0  MG  --  2.0  --    Liver Function Tests: Recent Labs  Lab 01/05/18 1001  AST 17  ALT 13  ALKPHOS 63  BILITOT 1.0  PROT 6.8  ALBUMIN 4.0   CBC: Recent Labs  Lab 01/05/18 1001 01/06/18 0354 01/07/18 0419  WBC 6.6 6.2 5.9  NEUTROABS 3.3  --   --   HGB 14.4 12.1* 12.2*  HCT 43.5 36.9* 36.9*  MCV 103.3* 103.9* 105.1*  PLT 140* 121* 117*   Cardiac Enzymes: Recent Labs  Lab 01/05/18 1001 01/05/18 1540 01/05/18 2114 01/06/18 0354  TROPONINI <0.03 <0.03 <0.03 <0.03   Thyroid function studies Recent Labs    01/05/18 1540  TSH 1.722    Microbiology Recent Results (from the past 240 hour(s))  MRSA PCR Screening     Status: None   Collection Time: 01/05/18  4:48 PM  Result Value Ref Range Status   MRSA by PCR NEGATIVE NEGATIVE Final     Comment:        The GeneXpert MRSA Assay (FDA approved for NASAL specimens only), is one component of a comprehensive MRSA colonization surveillance program. It is not intended to diagnose MRSA infection nor to guide or monitor treatment for MRSA infections. Performed at [facility], [address], [place], [place] [address]     Time coordinating discharge: Approximately 40 minutes  Patrecia Pour, MD  [facility] 01/07/2018, 3:28 PM Pager [phone]

## 2018-01-08 ENCOUNTER — Encounter (HOSPITAL_COMMUNITY): Payer: Self-pay | Admitting: Internal Medicine

## 2018-01-09 ENCOUNTER — Other Ambulatory Visit (INDEPENDENT_AMBULATORY_CARE_PROVIDER_SITE_OTHER): Payer: Self-pay | Admitting: Internal Medicine

## 2018-01-09 MED ORDER — CEPHALEXIN 500 MG PO CAPS: 500.0000 mg | ORAL_CAPSULE | Freq: Three times a day (TID) | ORAL | 0 refills | Status: DC

## 2018-01-09 MED ORDER — METRONIDAZOLE 500 MG PO TABS: 500.0000 mg | ORAL_TABLET | Freq: Three times a day (TID) | ORAL | 0 refills | Status: DC

## 2018-01-26 ENCOUNTER — Telehealth (INDEPENDENT_AMBULATORY_CARE_PROVIDER_SITE_OTHER): Payer: Self-pay | Admitting: *Deleted

## 2018-01-26 NOTE — Telephone Encounter (Signed)
Ms. Robb called and sates that the patient has completed the Keflex and the Flagyl. She says that he may feel a little better. Has a good BM this morning, later he felt that he had to go again. When he went it was just clear water.  Patient is wanting to go to [place] , if so , it will be April beofre they come back home. Call back number is [phone]

## 2018-01-29 ENCOUNTER — Other Ambulatory Visit: Payer: Self-pay | Admitting: Student

## 2018-01-29 MED ORDER — APIXABAN 5 MG PO TABS: 5.0000 mg | ORAL_TABLET | Freq: Two times a day (BID) | ORAL | 6 refills | Status: DC

## 2018-01-29 NOTE — Telephone Encounter (Signed)
Patient is calling regarding RX for Eliquis patient was given in hospital. Patient is not to see [place] until 12/17. Patient's wife states that patient will run out of Eliquis before then. Wants to know what they are to do about the medicine and if he needs any bloodwork prior to visit. / tg

## 2018-01-29 NOTE — Telephone Encounter (Signed)
I REFILLED WITH 30 DAY FREE CARD as wife will pick up today    eliquis 5 mg

## 2018-02-03 ENCOUNTER — Telehealth (INDEPENDENT_AMBULATORY_CARE_PROVIDER_SITE_OTHER): Payer: Self-pay | Admitting: *Deleted

## 2018-02-03 NOTE — Telephone Encounter (Signed)
Patient's wife called and she states that his diarrhea has gotten bad again. He has had 2 fecal accidents. She feels that he had done well while taking the Keflex and Flagyl. They are planning to go to [place] on 02/10/2018 and not return until mid or late April. She is asking if he may try the Prednisone.  This was addressed with Dr.Rehman - He agrees that the patient may take the Prednisone 20 mg daily , he will need to call the office on 02/09/2018 for instructions for taper and he is weigh daily. This was [facility]. Ms Benney was made aware.

## 2018-02-04 ENCOUNTER — Other Ambulatory Visit (HOSPITAL_COMMUNITY): Payer: Self-pay | Admitting: Student

## 2018-02-04 NOTE — Telephone Encounter (Signed)
Patient will need refills by outpatient provider.

## 2018-02-07 NOTE — Telephone Encounter (Signed)
Patient cannot tell any difference with prednisone so far. Patient called office after finishing antibiotic he said he was may be better.  His wife states that he had near normal bowel movement towards the end of therapy. If he does not respond to prednisone in 1 week we will taper him down and treated with cephalexin and metronidazole 3 times daily for 4 weeks. Patient to call office on the morning of 04/13/2017

## 2018-02-08 NOTE — Telephone Encounter (Signed)
Noted  

## 2018-02-09 ENCOUNTER — Encounter: Payer: Self-pay | Admitting: Student

## 2018-02-09 ENCOUNTER — Ambulatory Visit: Payer: Medicare HMO | Admitting: Student

## 2018-02-09 VITALS — BP 128/74 | HR 66 | Ht 69.0 in | Wt 202.0 lb

## 2018-02-09 DIAGNOSIS — E782 Mixed hyperlipidemia: Secondary | ICD-10-CM | POA: Diagnosis not present

## 2018-02-09 DIAGNOSIS — I10 Essential (primary) hypertension: Secondary | ICD-10-CM

## 2018-02-09 DIAGNOSIS — K529 Noninfective gastroenteritis and colitis, unspecified: Secondary | ICD-10-CM

## 2018-02-09 DIAGNOSIS — I4892 Unspecified atrial flutter: Secondary | ICD-10-CM

## 2018-02-09 MED ORDER — DILTIAZEM HCL 30 MG PO TABS: 30.0000 mg | ORAL_TABLET | Freq: Two times a day (BID) | ORAL | 3 refills | Status: DC

## 2018-02-09 MED ORDER — SIMVASTATIN 20 MG PO TABS: 40.0000 mg | ORAL_TABLET | Freq: Every day | ORAL | 3 refills | Status: DC

## 2018-02-09 NOTE — Progress Notes (Signed)
Cardiology Office Note    Date:  02/09/2018   ID:  NAS WAFER, DOB [birth year], MRN [id number]  PCP:  Lucia Gaskins, MD  Cardiologist: Kate Sable, MD    Chief Complaint  Patient presents with  . Hospitalization Follow-up    History of Present Illness:    Juan Quinn is a 84 y.o. male with past medical history of HTN, HLD, and chronic diarrhea (being followed by GI) who presents to the office today for hospital follow-up.   He was recently admitted to [facility] on 01/05/2018 for evaluation of tachycardia initially diagnosed while at his outpatient GI appointment. Initial EKG showed atrial flutter with RVR and he was started on IV Cardizem for rate-control and Heparin for anticoagulation. Initial labs showed electrolytes were WNL and TSH normal at 1.722. Echocardiogram showed a preserved EF of 65-70% with no regional WMA and no significant valvular abnormalities. He converted back to NSR the evening of admission and Heparin was transitioned to Eliquis following his EGD (which showed no significant abnormalities). Amlodipine was also discontinued and transitioned to short-acting Cardizem 30mg  BID.   In talking with the patient today, he reports overall doing well from a cardiac perspective since his recent admission. He was overall unaware of his arrhythmia during that timeframe but denies any recent chest pain, palpitations, dyspnea on exertion, orthopnea, PND, or lower extremity edema.  He does report having persistent fatigue over the past year which has been unchanged. Notes that his episodes of chronic diarrhea started to occur during that timeframe as well. Recently prescribed additional steroids by his Gastroenterologist.  The patient and his wife are planning to drive to [place] tomorrow as they will reside there for the next 4 months.   Past Medical History:  Diagnosis Date  . Adenomatous colon polyp 2000   Surveillance due 2017  . Atrial flutter ([place])    a.  diagnosed in 12/2017  . Helicobacter pylori antibody positive 2015   treatment with Prevpac  . HTN (hypertension)   . Hypercholesteremia   . Squamous cell carcinoma     Past Surgical History:  Procedure Laterality Date  . BIOPSY  10/24/2016   Procedure: BIOPSY;  Surgeon: Daneil Dolin, MD;  [facility];  Service: Endoscopy;;  colon  . BIOPSY  01/06/2018   Procedure: BIOPSY;  Surgeon: Rogene Houston, MD;  [facility];  Service: Endoscopy;;  duodenal   . COLONOSCOPY  June 2012   Dr. Gala Romney: adenomatous polyps, due for surveillance if health permits in 5400  . COLONOSCOPY N/A 10/24/2016   Procedure: COLONOSCOPY;  Surgeon: Daneil Dolin, MD;  [facility];  Service: Endoscopy;  Laterality: N/A;  2:00pm  . ESOPHAGOGASTRODUODENOSCOPY N/A 05/24/2013   Dr. Rourk:Prepyloric gastric ulcerations-likely source of hematemesis-likely NSAID-related. H.PYLORI SEROLOGY POSITIVE, TREATED WITH PREVPAC  . ESOPHAGOGASTRODUODENOSCOPY N/A 05/23/2013   QQP:YPPJKDTOIZ EGD secondary to much clot and food debris in the stomach  . ESOPHAGOGASTRODUODENOSCOPY N/A 08/16/2013   Dr. Gala Romney: normal esophagus, stomach empty, deformity of antrum. scar present. Previously noted ulcers completed healed. Patent pylorus, normal D1 and D2  . ESOPHAGOGASTRODUODENOSCOPY N/A 01/06/2018   Procedure: ESOPHAGOGASTRODUODENOSCOPY (EGD);  Surgeon: Rogene Houston, MD;  [facility];  Service: Endoscopy;  Laterality: N/A;  . HEMORRHOID SURGERY    . POLYPECTOMY  10/24/2016   Procedure: POLYPECTOMY;  Surgeon: Daneil Dolin, MD;  [facility];  Service: Endoscopy;;  colon  . shoulder  Current Medications: Outpatient Medications Prior to Visit  Medication Sig Dispense Refill  . allopurinol (ZYLOPRIM) 300 MG tablet Take 300 mg by mouth daily.    Marland Kitchen apixaban (ELIQUIS) 5 MG TABS tablet Take 1 tablet (5 mg total) by mouth 2 (two) times daily. 60 tablet 6  . Cholecalciferol  (VITAMIN D3) 2000 units TABS Take 1 capsule by mouth 2 (two) times daily.     . fish oil-omega-3 fatty acids 1000 MG capsule Take 1 g by mouth daily.      Marland Kitchen ipratropium (ATROVENT) 0.06 % nasal spray Place 2 sprays into both nostrils daily as needed for rhinitis.     Vladimir Faster Glycol-Propyl Glycol (SYSTANE OP) Apply 1 drop to eye 4 (four) times daily as needed (dry eyes).    . predniSONE (DELTASONE) 5 MG tablet Take 5 mg by mouth 4 (four) times daily.    Marland Kitchen terazosin (HYTRIN) 5 MG capsule Take 5 mg by mouth daily.     . vitamin B-12 (CYANOCOBALAMIN) 1000 MCG tablet Take 1,000 mcg by mouth daily. Patient states that he takes 3000 mcg daily    . diltiazem (CARDIZEM) 30 MG tablet Take 1 tablet (30 mg total) by mouth 2 (two) times daily. 60 tablet 0  . simvastatin (ZOCOR) 40 MG tablet Take 40 mg by mouth daily.     . cephALEXin (KEFLEX) 500 MG capsule Take 1 capsule (500 mg total) by mouth 3 (three) times daily. 30 capsule 0  . metroNIDAZOLE (FLAGYL) 500 MG tablet Take 1 tablet (500 mg total) by mouth 3 (three) times daily. 30 tablet 0   No facility-administered medications prior to visit.      Allergies:   Patient has no known allergies.   Social History   Socioeconomic History  . Marital status: Married    Spouse name: Not on file  . Number of children: 4  . Years of education: Not on file  . Highest education level: Not on file  Occupational History  . Occupation: retired    Comment: Geographical information systems officer  . Financial resource strain: Not on file  . Food insecurity:    Worry: Not on file    Inability: Not on file  . Transportation needs:    Medical: Not on file    Non-medical: Not on file  Tobacco Use  . Smoking status: Former Smoker    Packs/day: 2.00    Years: 50.00    Pack years: 100.00    Types: Cigarettes    Last attempt to quit: 07/28/2000    Years since quitting: 17.5  . Smokeless tobacco: Former Systems developer    Quit date: 02/25/1999  Substance and Sexual Activity  . Alcohol  use: No    Comment: couple glasses wine/week, occ beer or rmixed drink  . Drug use: No    Frequency: 2.0 times per week  . Sexual activity: Not on file  Lifestyle  . Physical activity:    Days per week: Not on file    Minutes per session: Not on file  . Stress: Not on file  Relationships  . Social connections:    Talks on phone: Not on file    Gets together: Not on file    Attends religious service: Not on file    Active member of club or organization: Not on file    Attends meetings of clubs or organizations: Not on file    Relationship status: Not on file  Other Topics Concern  . Not on file  Social History Narrative   Lives w/ wife           Family History:  The patient's family history includes Leukemia in his brother; Lung cancer in his sister; Pancreatic cancer in his sister; Stroke in his father and mother.   Review of Systems:   Please see the history of present illness.     General:  No chills, fever, night sweats or weight changes.  Cardiovascular:  No chest pain, dyspnea on exertion, edema, orthopnea, palpitations, paroxysmal nocturnal dyspnea. Dermatological: No rash, lesions/masses Respiratory: No cough, dyspnea Urologic: No hematuria, dysuria Abdominal:   No nausea, vomiting, bright red blood per rectum, melena, or hematemesis. Positive for chronic diarrhea.  Neurologic:  No visual changes, wkns, changes in mental status.  All other systems reviewed and are otherwise negative except as noted above.   Physical Exam:    VS:  BP 128/74 (BP Location: Right Arm)   Pulse 66   Ht 5\' 9"  (1.753 m)   Wt 202 lb (91.6 kg)   SpO2 93%   BMI 29.83 kg/m    General: Well developed, well nourished Caucasian male appearing in no acute distress. Head: Normocephalic, atraumatic, sclera non-icteric, no xanthomas, nares are without discharge.  Neck: No carotid bruits. JVD not elevated.  Lungs: Respirations regular and unlabored, without wheezes or rales.  Heart: Regular  rate and rhythm. No S3 or S4.  No murmur, no rubs, or gallops appreciated. Abdomen: Soft, non-tender, non-distended with normoactive bowel sounds. No hepatomegaly. No rebound/guarding. No obvious abdominal masses. Msk:  Strength and tone appear normal for age. No joint deformities or effusions. Extremities: No clubbing or cyanosis. No lower extremity edema.  Distal pedal pulses are 2+ bilaterally. Neuro: Alert and oriented X 3. Moves all extremities spontaneously. No focal deficits noted. Psych:  Responds to questions appropriately with a normal affect. Skin: No rashes or lesions noted  Wt Readings from Last 3 Encounters:  02/09/18 202 lb (91.6 kg)  01/07/18 195 lb 1.7 oz (88.5 kg)  01/05/18 200 lb 3.2 oz (90.8 kg)     Studies/Labs Reviewed:   EKG:  EKG is ordered today. The ekg ordered today demonstrates NSR, HR 60, with known RBBB and bifascicular block. Nonspecific ST abnormality along Lead III with no acute ST changes when compared to prior tracings.  Recent Labs: 01/05/2018: ALT 13; TSH 1.722 01/06/2018: Magnesium 2.0 01/07/2018: BUN 14; Creatinine, Ser 0.88; Hemoglobin 12.2; Platelets 117; Potassium 3.4; Sodium 141   Lipid Panel No results found for: CHOL, TRIG, HDL, CHOLHDL, VLDL, LDLCALC, LDLDIRECT  Additional studies/ records that were reviewed today include:   Echocardiogram: 01/06/2018 Study Conclusions  - Left ventricle: The cavity size was normal. Wall thickness was   increased in a pattern of moderate LVH. Systolic function was   vigorous. The estimated ejection fraction was in the range of 65%   to 70%. Wall motion was normal; there were no regional wall   motion abnormalities. The study is not technically sufficient to   allow evaluation of LV diastolic function. - Aortic valve: Mildly calcified annulus. Trileaflet; mildly   thickened leaflets. Valve area (VTI): 3.46 cm^2. Valve area   (Vmax): 3.29 cm^2. - Mitral valve: Mildly calcified annulus. Mildly  thickened leaflets  Assessment:    1. Atrial flutter, paroxysmal ([place])   2. Essential hypertension   3. Mixed hyperlipidemia   4. Chronic diarrhea      Plan:   In order of problems listed above:  1. Paroxysmal Atrial Flutter -  This was initially diagnosed in 12/2017 and the patient was overall unaware of his arrhythmia. He did convert to normal sinus rhythm the day of admission and has denied any recurrent symptoms since. I encouraged him to follow his HR in the outpatient setting for if he is unaware of his arrhythmia, this will help [place] monitor for recurrence. If noted to have episodes of tachycardia, would consider placement of a 30-day event monitor to assess his AF burden. - He is maintaining normal sinus rhythm by examination and EKG today. Continue low-dose Cardizem 30 mg twice daily for rate control. - Denies any evidence of active bleeding. Continue Eliquis 5 mg twice daily for anticoagulation (only indication for reduced dosing is age). Was advised against taking ASA or NSAID's while on anticoagulation.  2. HTN - BP is well controlled at 128/74 during today's visit. Continue current medication regimen.  3. HLD - Followed by PCP. He has remained on Simvastatin 40 mg daily but given the concurrent use of Diltiazem (although at a low dose), will reduce this to 20 mg daily. Pending repeat FLP, may need to consider switching to Pravastatin or Crestor.  4. Chronic Diarrhea - Being followed by GI. He remains on Prednisone 20 mg daily. Will need to continue to follow K+ levels if symptoms do not improve as he was mildly hypokalemic during recent admission.   Medication Adjustments/Labs and Tests Ordered: Current medicines are reviewed at length with the patient today.  Concerns regarding medicines are outlined above.  Medication changes, Labs and Tests ordered today are listed in the Patient Instructions below. Patient Instructions  Medication Instructions:  DECREASE Simvastatin to  20 mg daily  If you need a refill on your cardiac medications before your next appointment, please call your pharmacy.   Lab work: None today If you have labs (blood work) drawn today and your tests are completely normal, you will receive your results only by: Marland Kitchen MyChart Message (if you have MyChart) OR . A paper copy in the mail If you have any lab test that is abnormal or we need to change your treatment, we will call you to review the results.  Testing/Procedures: none  Follow-Up: At [facility], you and your health needs are our priority.  As part of our continuing mission to provide you with exceptional heart care, we have created designated Provider Care Teams.  These Care Teams include your primary Cardiologist (physician) and Advanced Practice Providers (APPs -  Physician Assistants and Nurse Practitioners) who all work together to provide you with the care you need, when you need it. You will need a follow up appointment in 6 months.  Please call our office 2 months in advance to schedule this appointment.  You may see Kate Sable, MD or one of the following Advanced Practice Providers on your designated Care Team:   Bernerd Pho, PA-C [facility]) . Ermalinda Barrios, PA-C ([place])  Any Other Special Instructions Will Be Listed Below (If Applicable). None    Signed, Erma Heritage, PA-C  02/09/2018 5:15 PM    [facility] [address] [place], [place] [address] Phone: [phone]

## 2018-02-09 NOTE — Patient Instructions (Signed)
Medication Instructions:  DECREASE Simvastatin to 20 mg daily  If you need a refill on your cardiac medications before your next appointment, please call your pharmacy.   Lab work: None today If you have labs (blood work) drawn today and your tests are completely normal, you will receive your results only by: Marland Kitchen MyChart Message (if you have MyChart) OR . A paper copy in the mail If you have any lab test that is abnormal or we need to change your treatment, we will call you to review the results.  Testing/Procedures: none  Follow-Up: At [facility], you and your health needs are our priority.  As part of our continuing mission to provide you with exceptional heart care, we have created designated Provider Care Teams.  These Care Teams include your primary Cardiologist (physician) and Advanced Practice Providers (APPs -  Physician Assistants and Nurse Practitioners) who all work together to provide you with the care you need, when you need it. You will need a follow up appointment in 6 months.  Please call our office 2 months in advance to schedule this appointment.  You may see Kate Sable, MD or one of the following Advanced Practice Providers on your designated Care Team:   Bernerd Pho, PA-C [facility]) . Ermalinda Barrios, PA-C ([place])  Any Other Special Instructions Will Be Listed Below (If Applicable). None

## 2018-02-10 ENCOUNTER — Other Ambulatory Visit (INDEPENDENT_AMBULATORY_CARE_PROVIDER_SITE_OTHER): Payer: Self-pay | Admitting: *Deleted

## 2018-02-10 ENCOUNTER — Telehealth (INDEPENDENT_AMBULATORY_CARE_PROVIDER_SITE_OTHER): Payer: Self-pay | Admitting: *Deleted

## 2018-02-10 MED ORDER — CEPHALEXIN 500 MG PO CAPS: 500.0000 mg | ORAL_CAPSULE | Freq: Three times a day (TID) | ORAL | 0 refills | Status: DC

## 2018-02-10 MED ORDER — METRONIDAZOLE 500 MG PO TABS: 500.0000 mg | ORAL_TABLET | Freq: Three times a day (TID) | ORAL | 0 refills | Status: DC

## 2018-02-10 NOTE — Telephone Encounter (Signed)
Patient's wife called and there is no change in the patient's condition taking the Prednisone. Per Dr.Rehman patient is to taper the Prednisone as follows - 10 mg tomorrow for 4 days , then 5 mg of Prednisone for 3 days then stop. [facility]  Patient's wife was made aware.

## 2018-07-02 ENCOUNTER — Other Ambulatory Visit: Payer: Self-pay

## 2018-07-02 ENCOUNTER — Telehealth: Payer: Self-pay

## 2018-07-02 ENCOUNTER — Emergency Department (HOSPITAL_COMMUNITY): Payer: Medicare HMO

## 2018-07-02 ENCOUNTER — Observation Stay (HOSPITAL_COMMUNITY)
Admission: EM | Admit: 2018-07-02 | Discharge: 2018-07-03 | Disposition: A | Discharge status: Home / Self Care | Payer: Medicare HMO | Attending: Internal Medicine | Admitting: Internal Medicine

## 2018-07-02 ENCOUNTER — Encounter (HOSPITAL_COMMUNITY): Payer: Self-pay | Admitting: Emergency Medicine

## 2018-07-02 DIAGNOSIS — Z87891 Personal history of nicotine dependence: Secondary | ICD-10-CM | POA: Diagnosis not present

## 2018-07-02 DIAGNOSIS — I480 Paroxysmal atrial fibrillation: Secondary | ICD-10-CM | POA: Diagnosis not present

## 2018-07-02 DIAGNOSIS — E785 Hyperlipidemia, unspecified: Secondary | ICD-10-CM | POA: Insufficient documentation

## 2018-07-02 DIAGNOSIS — I4892 Unspecified atrial flutter: Secondary | ICD-10-CM

## 2018-07-02 DIAGNOSIS — D696 Thrombocytopenia, unspecified: Secondary | ICD-10-CM

## 2018-07-02 DIAGNOSIS — N400 Enlarged prostate without lower urinary tract symptoms: Secondary | ICD-10-CM | POA: Diagnosis not present

## 2018-07-02 DIAGNOSIS — Z7901 Long term (current) use of anticoagulants: Secondary | ICD-10-CM | POA: Insufficient documentation

## 2018-07-02 DIAGNOSIS — R197 Diarrhea, unspecified: Secondary | ICD-10-CM | POA: Diagnosis not present

## 2018-07-02 DIAGNOSIS — I10 Essential (primary) hypertension: Secondary | ICD-10-CM | POA: Diagnosis not present

## 2018-07-02 DIAGNOSIS — Z79899 Other long term (current) drug therapy: Secondary | ICD-10-CM | POA: Insufficient documentation

## 2018-07-02 DIAGNOSIS — I4891 Unspecified atrial fibrillation: Secondary | ICD-10-CM | POA: Diagnosis present

## 2018-07-02 DIAGNOSIS — M1000 Idiopathic gout, unspecified site: Secondary | ICD-10-CM | POA: Diagnosis not present

## 2018-07-02 DIAGNOSIS — E876 Hypokalemia: Secondary | ICD-10-CM | POA: Diagnosis not present

## 2018-07-02 DIAGNOSIS — R000 Tachycardia, unspecified: Secondary | ICD-10-CM | POA: Diagnosis present

## 2018-07-02 LAB — CBC
HCT: 40.9 % (ref 39.0–52.0)
Hemoglobin: 14.3 g/dL (ref 13.0–17.0)
MCH: 36.4 pg — ABNORMAL HIGH (ref 26.0–34.0)
MCHC: 35 g/dL (ref 30.0–36.0)
MCV: 104.1 fL — ABNORMAL HIGH (ref 80.0–100.0)
Platelets: 142 10*3/uL — ABNORMAL LOW (ref 150–400)
RBC: 3.93 MIL/uL — ABNORMAL LOW (ref 4.22–5.81)
RDW: 11.9 % (ref 11.5–15.5)
WBC: 6.7 10*3/uL (ref 4.0–10.5)
nRBC: 0 % (ref 0.0–0.2)

## 2018-07-02 LAB — BASIC METABOLIC PANEL
Anion gap: 8 (ref 5–15)
BUN: 15 mg/dL (ref 8–23)
CO2: 25 mmol/L (ref 22–32)
Calcium: 9.5 mg/dL (ref 8.9–10.3)
Chloride: 110 mmol/L (ref 98–111)
Creatinine, Ser: 0.87 mg/dL (ref 0.61–1.24)
GFR calc Af Amer: >60 mL/min (ref >60)
GFR calc non Af Amer: >60 mL/min (ref >60)
Glucose, Bld: 125 mg/dL — ABNORMAL HIGH (ref 70–99)
Potassium: 3.3 mmol/L — ABNORMAL LOW (ref 3.5–5.1)
Sodium: 143 mmol/L (ref 135–145)

## 2018-07-02 LAB — T4, FREE: Free T4: 0.75 ng/dL — ABNORMAL LOW (ref 0.82–1.77)

## 2018-07-02 LAB — VITAMIN B12: Vitamin B-12: 837 pg/mL (ref 180–914)

## 2018-07-02 LAB — FOLATE: Folate: 15.3 ng/mL (ref >5.9)

## 2018-07-02 LAB — TSH: TSH: 2.826 u[IU]/mL (ref 0.350–4.500)

## 2018-07-02 MED ORDER — ONDANSETRON HCL 4 MG/2ML IJ SOLN: 4.0000 mg | Freq: Four times a day (QID) | INTRAMUSCULAR | Status: DC | PRN

## 2018-07-02 MED ORDER — DILTIAZEM LOAD VIA INFUSION
10.0000 mg | Freq: Once | INTRAVENOUS | Status: AC
  Administered 2018-07-02: 12:00:00 10 mg via INTRAVENOUS
  Filled 2018-07-02: qty 10

## 2018-07-02 MED ORDER — ACETAMINOPHEN 325 MG PO TABS: 650.0000 mg | ORAL_TABLET | Freq: Four times a day (QID) | ORAL | Status: DC | PRN

## 2018-07-02 MED ORDER — IPRATROPIUM BROMIDE 0.06 % NA SOLN
2.0000 | Freq: Every day | NASAL | Status: DC | PRN
  Filled 2018-07-02: qty 15

## 2018-07-02 MED ORDER — SODIUM CHLORIDE 0.9 % IV SOLN
INTRAVENOUS | Status: DC
  Administered 2018-07-02: 12:00:00 via INTRAVENOUS

## 2018-07-02 MED ORDER — ACETAMINOPHEN 650 MG RE SUPP: 650.0000 mg | Freq: Four times a day (QID) | RECTAL | Status: DC | PRN

## 2018-07-02 MED ORDER — DILTIAZEM HCL 100 MG IV SOLR
5.0000 mg/h | INTRAVENOUS | Status: DC
  Administered 2018-07-02: 12:00:00 5 mg/h via INTRAVENOUS
  Filled 2018-07-02: qty 100

## 2018-07-02 MED ORDER — SIMVASTATIN 20 MG PO TABS: 20.0000 mg | ORAL_TABLET | Freq: Every day | ORAL | Status: DC

## 2018-07-02 MED ORDER — VITAMIN D3 50 MCG (2000 UT) PO TABS: 1.0000 | ORAL_TABLET | Freq: Two times a day (BID) | ORAL | Status: DC

## 2018-07-02 MED ORDER — ALLOPURINOL 300 MG PO TABS: 300.0000 mg | ORAL_TABLET | Freq: Every day | ORAL | Status: DC

## 2018-07-02 MED ORDER — APIXABAN 5 MG PO TABS
5.0000 mg | ORAL_TABLET | Freq: Two times a day (BID) | ORAL | Status: DC
  Administered 2018-07-02: 22:00:00 5 mg via ORAL
  Filled 2018-07-02: qty 1

## 2018-07-02 MED ORDER — SODIUM CHLORIDE 0.9 % IV BOLUS
500.0000 mL | Freq: Once | INTRAVENOUS | Status: AC
  Administered 2018-07-02: 12:00:00 500 mL via INTRAVENOUS

## 2018-07-02 MED ORDER — VITAMIN D 25 MCG (1000 UNIT) PO TABS
2000.0000 [IU] | ORAL_TABLET | Freq: Two times a day (BID) | ORAL | Status: DC
  Administered 2018-07-02: 22:00:00 2000 [IU] via ORAL
  Filled 2018-07-02: qty 2

## 2018-07-02 MED ORDER — DILTIAZEM HCL 30 MG PO TABS
30.0000 mg | ORAL_TABLET | Freq: Two times a day (BID) | ORAL | Status: DC
  Administered 2018-07-02: 17:00:00 30 mg via ORAL
  Filled 2018-07-02: qty 1

## 2018-07-02 MED ORDER — OMEGA-3 FATTY ACIDS 1000 MG PO CAPS: 1.0000 g | ORAL_CAPSULE | Freq: Every day | ORAL | Status: DC

## 2018-07-02 MED ORDER — SIMVASTATIN 10 MG PO TABS: 10.0000 mg | ORAL_TABLET | Freq: Every day | ORAL | Status: DC

## 2018-07-02 MED ORDER — OMEGA-3-ACID ETHYL ESTERS 1 G PO CAPS: 1.0000 g | ORAL_CAPSULE | Freq: Every day | ORAL | Status: DC

## 2018-07-02 MED ORDER — POTASSIUM CHLORIDE CRYS ER 20 MEQ PO TBCR
40.0000 meq | EXTENDED_RELEASE_TABLET | Freq: Once | ORAL | Status: AC
  Administered 2018-07-02: 40 meq via ORAL
  Filled 2018-07-02: qty 2

## 2018-07-02 MED ORDER — ONDANSETRON HCL 4 MG PO TABS: 4.0000 mg | ORAL_TABLET | Freq: Four times a day (QID) | ORAL | Status: DC | PRN

## 2018-07-02 NOTE — Care Management Obs Status (Signed)
[place] NOTIFICATION   Patient Details  Name: Juan Quinn MRN: [id number] Date of Birth: 1930/09/10   Medicare Observation Status Notification Given:  Yes    [place], LCSW 07/02/2018, 10:00 PM

## 2018-07-02 NOTE — Discharge Instructions (Signed)
YOUR CARDIOLOGY TEAM HAS ARRANGED FOR AN E-VISIT FOR YOUR APPOINTMENT - PLEASE REVIEW IMPORTANT INFORMATION BELOW SEVERAL DAYS PRIOR TO YOUR APPOINTMENT ° °Due to the recent COVID-19 pandemic, we are transitioning in-person office visits to tele-medicine visits in an effort to decrease unnecessary exposure to our patients, their families, and staff. These visits are billed to your insurance just like a normal visit is. We also encourage you to sign up for MyChart if you have not already done so. You will need a smartphone if possible. For patients that do not have this, we can still complete the visit using a regular telephone but do prefer a smartphone to enable video when possible. You may have a family member that lives with you that can help. If possible, we also ask that you have a blood pressure cuff and scale at home to measure your blood pressure, heart rate and weight prior to your scheduled appointment. Patients with clinical needs that need an in-person evaluation and testing will still be able to come to the office if absolutely necessary. If you have any questions, feel free to call our office. ° ° ° °2-3 DAYS BEFORE YOUR APPOINTMENT ° °You will receive a telephone call from one of our [facility] team members - your caller ID may say "Unknown caller." If this is a video visit, we will walk you through how to get the video launched on your phone. We will remind you check your blood pressure, heart rate and weight prior to your scheduled appointment. If you have an Apple Watch or Kardia, please upload any pertinent ECG strips the day before or morning of your appointment to MyChart. Our staff will also make sure you have reviewed the consent and agree to move forward with your scheduled tele-health visit.  ° ° ° °THE DAY OF YOUR APPOINTMENT ° °Approximately 15 minutes prior to your scheduled appointment, you will receive a telephone call from one of HeartCare team - your caller ID may say "Unknown caller."   Our staff will confirm medications, vital signs for the day and any symptoms you may be experiencing. Please have this information available prior to the time of visit start. It may also be helpful for you to have a pad of paper and pen handy for any instructions given during your visit. They will also walk you through joining the smartphone meeting if this is a video visit. ° ° ° °CONSENT FOR TELE-HEALTH VISIT - PLEASE REVIEW ° °I hereby voluntarily request, consent and authorize [facility] and its employed or contracted physicians, physician assistants, nurse practitioners or other licensed health care professionals (the Practitioner), to provide me with telemedicine health care services (the “Services") as deemed necessary by the treating Practitioner. I acknowledge and consent to receive the Services by the Practitioner via telemedicine. I understand that the telemedicine visit will involve communicating with the Practitioner through live audiovisual communication technology and the disclosure of certain medical information by electronic transmission. I acknowledge that I have been given the opportunity to request an in-person assessment or other available alternative prior to the telemedicine visit and am voluntarily participating in the telemedicine visit. ° °I understand that I have the right to withhold or withdraw my consent to the use of telemedicine in the course of my care at any time, without affecting my right to future care or treatment, and that the Practitioner or I may terminate the telemedicine visit at any time. I understand that I have the right to inspect all information   obtained and/or recorded in the course of the telemedicine visit and may receive copies of available information for a reasonable fee.  I understand that some of the potential risks of receiving the Services via telemedicine include:  °• Delay or interruption in medical evaluation due to technological equipment failure or  disruption; °• Information transmitted may not be sufficient (e.g. poor resolution of images) to allow for appropriate medical decision making by the Practitioner; and/or  °• In rare instances, security protocols could fail, causing a breach of personal health information. ° °Furthermore, I acknowledge that it is my responsibility to provide information about my medical history, conditions and care that is complete and accurate to the best of my ability. I acknowledge that Practitioner's advice, recommendations, and/or decision may be based on factors not within their control, such as incomplete or inaccurate data provided by me or distortions of diagnostic images or specimens that may result from electronic transmissions. I understand that the practice of medicine is not an exact science and that Practitioner makes no warranties or guarantees regarding treatment outcomes. I acknowledge that I will receive a copy of this consent concurrently upon execution via email to the email address I last provided but may also request a printed copy by calling the office of [facility].   ° °I understand that my insurance will be billed for this visit.  ° °I have read or had this consent read to me. °• I understand the contents of this consent, which adequately explains the benefits and risks of the Services being provided via telemedicine.  °• I have been provided ample opportunity to ask questions regarding this consent and the Services and have had my questions answered to my satisfaction. °• I give my informed consent for the services to be provided through the use of telemedicine in my medical care ° °By participating in this telemedicine visit I agree to the above. ° °

## 2018-07-02 NOTE — Telephone Encounter (Signed)
   Agree with recommendation. He may be a candidate for DCCV while in the ED if he has been compliant with Eliquis. Will route to Dr. Domenic Polite as well for he is covering at [facility] today.   Signed, Erma Heritage, PA-C 07/02/2018, 10:51 AM Pager: [phone]

## 2018-07-02 NOTE — ED Notes (Signed)
Dr. Carles Collet notified of pt's HR dropping into the high 50's at times. Cardizem drip was stopped per titration orders.

## 2018-07-02 NOTE — ED Provider Notes (Signed)
[facility] EMERGENCY DEPARTMENT Provider Note   CSN: [id number] Arrival date & time: 07/02/18  1051    History   Chief Complaint Chief Complaint  Patient presents with  . Tachycardia    HPI Juan Quinn is a 85 y.o. male.     Patient with acute onset of rapid heart rate shortly prior to arrival.  Patient was diagnosed with atrial flutter rapid heart rate with admission in November.  Patient currently taking Cardizem 30 mg twice a day.  Is also on Eliquis.  Past medical history also significant for hypercholesterol hypertension.  Squamous cell carcinoma.  Patient denies any fever any congestion any shortness of breath or chest pain.     Past Medical History:  Diagnosis Date  . Adenomatous colon polyp 2000   Surveillance due 2017  . Atrial flutter ([place])    a. diagnosed in 12/2017  . Helicobacter pylori antibody positive 2015   treatment with Prevpac  . HTN (hypertension)   . Hypercholesteremia   . Squamous cell carcinoma     Patient Active Problem List   Diagnosis Date Noted  . Atrial fibrillation with RVR ([place]) 01/05/2018  . Abdominal pain, bilateral lower quadrant 12/25/2016  . Diarrhea 11/06/2016  . Abnormal weight loss 11/06/2016  . Diarrhea of presumed infectious origin 10/07/2016  . PUD (peptic ulcer disease) 07/26/2013  . Helicobacter pylori ab+ 07/26/2013  . Upper GI bleed 05/23/2013  . HTN (hypertension) 05/23/2013  . Hyperlipidemia 05/23/2013  . Anemia due to acute blood loss 05/23/2013  . Hematemesis 05/23/2013  . Hx of adenomatous colonic polyps 07/29/2010  . Adenomatous polyp of colon 07/29/2010    Past Surgical History:  Procedure Laterality Date  . BIOPSY  10/24/2016   Procedure: BIOPSY;  Surgeon: Daneil Dolin, MD;  Location: [facility];  Service: Endoscopy;;  colon  . BIOPSY  01/06/2018   Procedure: BIOPSY;  Surgeon: Rogene Houston, MD;  Location: [facility];  Service: Endoscopy;;  duodenal   . COLONOSCOPY  June 2012   Dr. Gala Romney:  adenomatous polyps, due for surveillance if health permits in 3244  . COLONOSCOPY N/A 10/24/2016   Procedure: COLONOSCOPY;  Surgeon: Daneil Dolin, MD;  Location: [facility];  Service: Endoscopy;  Laterality: N/A;  2:00pm  . ESOPHAGOGASTRODUODENOSCOPY N/A 05/24/2013   Dr. Rourk:Prepyloric gastric ulcerations-likely source of hematemesis-likely NSAID-related. H.PYLORI SEROLOGY POSITIVE, TREATED WITH PREVPAC  . ESOPHAGOGASTRODUODENOSCOPY N/A 05/23/2013   WNU:UVOZDGUYQI EGD secondary to much clot and food debris in the stomach  . ESOPHAGOGASTRODUODENOSCOPY N/A 08/16/2013   Dr. Gala Romney: normal esophagus, stomach empty, deformity of antrum. scar present. Previously noted ulcers completed healed. Patent pylorus, normal D1 and D2  . ESOPHAGOGASTRODUODENOSCOPY N/A 01/06/2018   Procedure: ESOPHAGOGASTRODUODENOSCOPY (EGD);  Surgeon: Rogene Houston, MD;  Location: [facility];  Service: Endoscopy;  Laterality: N/A;  . HEMORRHOID SURGERY    . POLYPECTOMY  10/24/2016   Procedure: POLYPECTOMY;  Surgeon: Daneil Dolin, MD;  Location: [facility];  Service: Endoscopy;;  colon  . shoulder       OB History   No obstetric history on file.      Home Medications    Prior to Admission medications   Medication Sig Start Date End Date Taking? Authorizing Provider  allopurinol (ZYLOPRIM) 300 MG tablet Take 300 mg by mouth daily.   Yes [provider]  apixaban (ELIQUIS) 5 MG TABS tablet Take 1 tablet (5 mg total) by mouth 2 (two) times daily. 01/29/18  Yes  Ahmed Prima, [place] M, PA-C  Cholecalciferol (VITAMIN D3) 2000 units TABS Take 1 capsule by mouth 2 (two) times daily.    Yes [provider]  diltiazem (CARDIZEM) 30 MG tablet Take 1 tablet (30 mg total) by mouth 2 (two) times daily. 02/09/18 02/04/19 Yes Strader, [place], PA-C  fish oil-omega-3 fatty acids 1000 MG capsule Take 1 g by mouth daily.     Yes [provider]  ipratropium (ATROVENT) 0.06 % nasal spray Place 2  sprays into both nostrils daily as needed for rhinitis.  04/29/13  Yes [provider]  Polyethyl Glycol-Propyl Glycol (SYSTANE OP) Apply 1 drop to eye 4 (four) times daily as needed (dry eyes).   Yes [provider]  simvastatin (ZOCOR) 20 MG tablet Take 2 tablets (40 mg total) by mouth daily. Patient taking differently: Take 10 mg by mouth daily.  02/09/18  Yes Strader, [place] M, PA-C  terazosin (HYTRIN) 5 MG capsule Take 5 mg by mouth daily.    Yes [provider]  vitamin B-12 (CYANOCOBALAMIN) 1000 MCG tablet Take 1,000 mcg by mouth daily. Patient states that he takes 3000 mcg daily   Yes [provider]  cephALEXin (KEFLEX) 500 MG capsule Take 1 capsule (500 mg total) by mouth 3 (three) times daily. Patient not taking: Reported on 07/02/2018 02/10/18   Rogene Houston, MD  metroNIDAZOLE (FLAGYL) 500 MG tablet Take 1 tablet (500 mg total) by mouth 3 (three) times daily. Patient not taking: Reported on 07/02/2018 02/10/18   Rogene Houston, MD  predniSONE (DELTASONE) 5 MG tablet Take 5 mg by mouth 4 (four) times daily.    [provider]    Family History Family History  Problem Relation Age of Onset  . Stroke Mother   . Stroke Father   . Pancreatic cancer Sister   . Leukemia Brother   . Lung cancer Sister   . Colon cancer Neg Hx     Social History Social History   Tobacco Use  . Smoking status: Former Smoker    Packs/day: 2.00    Years: 50.00    Pack years: 100.00    Types: Cigarettes    Last attempt to quit: 07/28/2000    Years since quitting: 17.9  . Smokeless tobacco: Former Systems developer    Quit date: 02/25/1999  Substance Use Topics  . Alcohol use: No    Comment: couple glasses wine/week, occ beer or rmixed drink  . Drug use: No    Frequency: 2.0 times per week     Allergies   Patient has no known allergies.   Review of Systems Review of Systems  Constitutional: Negative for chills and fever.  HENT: Negative for rhinorrhea and  sore throat.   Eyes: Negative for visual disturbance.  Respiratory: Negative for cough and shortness of breath.   Cardiovascular: Positive for palpitations. Negative for chest pain and leg swelling.  Gastrointestinal: Negative for abdominal pain, diarrhea, nausea and vomiting.  Genitourinary: Negative for dysuria.  Musculoskeletal: Negative for back pain and neck pain.  Skin: Negative for rash.  Neurological: Negative for dizziness, light-headedness and headaches.  Hematological: Bruises/bleeds easily.  Psychiatric/Behavioral: Negative for confusion.     Physical Exam Updated Vital Signs BP 124/66   Pulse (!) 59   Temp 98.5 F (36.9 C) (Oral)   Resp (!) 21   Ht 1.727 m (5\' 8" )   Wt 89.8 kg   SpO2 95%   BMI 30.11 kg/m   Physical Exam Vitals signs and  nursing note reviewed.  Constitutional:      General: He is not in acute distress.    Appearance: Normal appearance. He is well-developed.  HENT:     Head: Normocephalic and atraumatic.  Eyes:     Extraocular Movements: Extraocular movements intact.     Conjunctiva/sclera: Conjunctivae normal.     Pupils: Pupils are equal, round, and reactive to light.  Neck:     Musculoskeletal: Normal range of motion and neck supple.  Cardiovascular:     Rate and Rhythm: Tachycardia present. Rhythm irregular.     Heart sounds: Normal heart sounds. No murmur.  Pulmonary:     Effort: Pulmonary effort is normal. No respiratory distress.     Breath sounds: Normal breath sounds.  Abdominal:     Palpations: Abdomen is soft.     Tenderness: There is no abdominal tenderness.  Musculoskeletal: Normal range of motion.  Skin:    General: Skin is warm and dry.     Capillary Refill: Capillary refill takes less than 2 seconds.  Neurological:     General: No focal deficit present.     Mental Status: He is alert and oriented to person, place, and time.      ED Treatments / Results  Labs (all labs ordered are listed, but only abnormal  results are displayed) Labs Reviewed  CBC - Abnormal; Notable for the following components:      Result Value   RBC 3.93 (*)    MCV 104.1 (*)    MCH 36.4 (*)    Platelets 142 (*)    All other components within normal limits  BASIC METABOLIC PANEL - Abnormal; Notable for the following components:   Potassium 3.3 (*)    Glucose, Bld 125 (*)    All other components within normal limits    EKG EKG Interpretation  Date/Time:  Friday Jul 02 2018 11:03:10 EDT Ventricular Rate:  132 PR Interval:    QRS Duration: 133 QT Interval:  351 QTC Calculation: 521 R Axis:   -61 Text Interpretation:  Atrial fibrillation RBBB and LAFB Probable posterior infarct, acute No significant change since last tracing Suspect rate related ischemia No physical chest pain Confirmed by Fredia Sorrow [phone]) on 07/02/2018 11:16:21 AM   Radiology Dg Chest Port 1 View  Result Date: 07/02/2018 CLINICAL DATA:  Palpitations for EXAM: PORTABLE CHEST - 1 VIEW COMPARISON:  01/05/2018 FINDINGS: Low lung volumes with resultant crowding of bronchovascular structures. No definite infiltrate or overt edema. Heart size upper limits normal for technique. Aortic Atherosclerosis (ICD10-170.0). No definite effusion. No pneumothorax. Visualized bones unremarkable. IMPRESSION: Low volumes. No definite acute disease. Electronically Signed   By: Lucrezia Europe M.D.   On: 07/02/2018 12:23    Procedures Procedures (including critical care time)  CRITICAL CARE Performed by: Fredia Sorrow Total critical care time: 30 minutes Critical care time was exclusive of separately billable procedures and treating other patients. Critical care was necessary to treat or prevent imminent or life-threatening deterioration. Critical care was time spent personally by me on the following activities: development of treatment plan with patient and/or surrogate as well as nursing, discussions with consultants, evaluation of patient's response to  treatment, examination of patient, obtaining history from patient or surrogate, ordering and performing treatments and interventions, ordering and review of laboratory studies, ordering and review of radiographic studies, pulse oximetry and re-evaluation of patient's condition.   Medications Ordered in ED Medications  0.9 %  sodium chloride infusion ( Intravenous New Bag/Given 07/02/18 1226)  diltiazem (CARDIZEM) 1 mg/mL load via infusion 10 mg (10 mg Intravenous Bolus from Bag 07/02/18 1147)    And  diltiazem (CARDIZEM) 100 mg in dextrose 5 % 100 mL (1 mg/mL) infusion (5 mg/hr Intravenous New Bag/Given 07/02/18 1147)  sodium chloride 0.9 % bolus 500 mL (0 mLs Intravenous Stopped 07/02/18 1226)     Initial Impression / Assessment and Plan / ED Course  I have reviewed the triage vital signs and the nursing notes.  Pertinent labs & imaging results that were available during my care of the patient were reviewed by me and considered in my medical decision making (see chart for details).       Patient with formal diagnosis of atrial flutter with rapid heart rate back in November.  Patient is taking Cardizem 30 mg twice a day did have a morning dose today.  Shortly prior to arrival he noted his heart rate was feeling fast.  They checked it at home they had a rate of 155.  Patient without any chest pain or shortness of breath.  Arrived here with heart rate around 133.  Patient received 10 mg of Cardizem IV slowed heart rate down some but he was still in atrial flutter and the rate would be anywhere from 1 30-100.  Started on drip.  Patient's heart rate improved significantly and he converted to normal sinus.  Patient is on Eliquis.  Chest x-ray and labs here today without significant abnormalities.  Other than potassium slightly low at 3.3.  Discussed with hospitalist for admission for the rapid atrial flutter.   Final Clinical Impressions(s) / ED Diagnoses   Final diagnoses:  Atrial flutter with  rapid ventricular response [facility])    ED Discharge Orders    None       Fredia Sorrow, MD 07/02/18 1418

## 2018-07-02 NOTE — TOC Initial Note (Signed)
Transition of Care [facility]) - Initial/Assessment Note    Patient Details  Name: Juan Quinn MRN: [id number] Date of Birth: [birth year]  Transition of Care [facility]) CM/SW Contact:    Muaad Boehning Dimitri Ped, LCSW Phone Number: 07/02/2018, 10:01 PM  Clinical Narrative:   Pt is a 85 year old caucasian male who was admitted under observation on 07/02/2018 with the following DX: atrial flutter w/rapid ventricular response.   Prior to being admitted pt reported that he lived at home with his wife. Pt goes into detail and states that he checks his pulse while at home with the assistance of his wife.   No additional supports in place at this time.    Expected Discharge Plan: Home/Self Care Barriers to Discharge: No Barriers Identified   Patient Goals and CMS Choice Patient states their goals for this hospitalization and ongoing recovery are:: to return home      Expected Discharge Plan and Services Expected Discharge Plan: Home/Self Care       Living arrangements for the past 2 months: Single Family Home                                      Prior Living Arrangements/Services Living arrangements for the past 2 months: Single Family Home Lives with:: Spouse Patient language and need for interpreter reviewed:: Yes Do you feel safe going back to the place where you live?: Yes      Need for Family Participation in Patient Care: No (Comment)     Criminal Activity/Legal Involvement Pertinent to Current Situation/Hospitalization: No - Comment as needed  Activities of Daily Living Home Assistive Devices/Equipment: Dentures (specify type), Hearing aid ADL Screening (condition at time of admission) Patient's cognitive ability adequate to safely complete daily activities?: Yes Is the patient deaf or have difficulty hearing?: Yes Does the patient have difficulty seeing, even when wearing glasses/contacts?: No Does the patient have difficulty concentrating, remembering, or making decisions?:  No Patient able to express need for assistance with ADLs?: Yes Does the patient have difficulty dressing or bathing?: No Independently performs ADLs?: Yes (appropriate for developmental age) Does the patient have difficulty walking or climbing stairs?: No Weakness of Legs: None Weakness of Arms/Hands: None  Permission Sought/Granted Permission sought to share information with : Family Supports, Case Manager Permission granted to share information with : Yes, Verbal Permission Granted              Emotional Assessment Appearance:: Appears stated age Attitude/Demeanor/Rapport: Engaged Affect (typically observed): Accepting, Calm Orientation: : Oriented to Self, Oriented to  Time, Oriented to Place, Oriented to Situation   Psych Involvement: No (comment)  Admission diagnosis:  Atrial flutter with rapid ventricular response [facility]) [I48.92] Patient Active Problem List   Diagnosis Date Noted  . Essential hypertension 07/02/2018  . Thrombocytopenia ([place]) 07/02/2018  . Atrial fibrillation with RVR ([place]) 01/05/2018  . Abdominal pain, bilateral lower quadrant 12/25/2016  . Diarrhea 11/06/2016  . Abnormal weight loss 11/06/2016  . Diarrhea of presumed infectious origin 10/07/2016  . PUD (peptic ulcer disease) 07/26/2013  . Helicobacter pylori ab+ 07/26/2013  . Upper GI bleed 05/23/2013  . HTN (hypertension) 05/23/2013  . Hyperlipidemia 05/23/2013  . Anemia due to acute blood loss 05/23/2013  . Hematemesis 05/23/2013  . Hx of adenomatous colonic polyps 07/29/2010  . Adenomatous polyp of colon 07/29/2010   PCP:  Lucia Gaskins, MD Pharmacy:   [id number] -  [place], [place] - [address] [place] [address] Phone: [phone] Fax: [phone]     Social Determinants of Health (SDOH) Interventions    Readmission Risk Interventions No flowsheet data found.

## 2018-07-02 NOTE — ED Triage Notes (Signed)
Pt states that his heart rate in the 150's at home his heart rate is 100 to 120's

## 2018-07-02 NOTE — Telephone Encounter (Signed)
Wife called to say husband is weak, BP 85/70 and HR is 155. Onset of symptoms was 10 minutes ago I advised her to take him to [facility] ED.She agrees. Had similar episode in past with atrial flutter with RVR     I will FYI B.Strader PA-C

## 2018-07-02 NOTE — ED Provider Notes (Signed)
[facility] EMERGENCY DEPARTMENT Provider Note   CSN: [id number] Arrival date & time: 07/02/18  1051    History   Chief Complaint Chief Complaint  Patient presents with  . Tachycardia    HPI Juan Quinn is a 85 y.o. male.     Patient with a history of atrial flutter.  Patient is to be on Cardizem 30 mg p.o. twice daily.  Patient not sure if he is taking that.  He is also on Eliquis.  Patient was admitted in November for rapid heart rate.  Has seen cardiology since that time.  This was a new diagnosis for him at that time.  Patient denies any chest pain or shortness of breath.  Denies any bleeding problems.  Heart rate upon arrival would be anywhere from 90-1 30.  Initial EKG showed some evidence of ST segment depression.  Computer read as an acute MI but I think these are all rate related things and particularly patient having no chest pain at all.  Patient states that the rapid heart rate started about 2 hours ago.  He got readings at home as high as 155.     Past Medical History:  Diagnosis Date  . Adenomatous colon polyp 2000   Surveillance due 2017  . Atrial flutter ([place])    a. diagnosed in 12/2017  . Helicobacter pylori antibody positive 2015   treatment with Prevpac  . HTN (hypertension)   . Hypercholesteremia   . Squamous cell carcinoma     Patient Active Problem List   Diagnosis Date Noted  . Essential hypertension 07/02/2018  . Thrombocytopenia ([place]) 07/02/2018  . Atrial fibrillation with RVR ([place]) 01/05/2018  . Abdominal pain, bilateral lower quadrant 12/25/2016  . Diarrhea 11/06/2016  . Abnormal weight loss 11/06/2016  . Diarrhea of presumed infectious origin 10/07/2016  . PUD (peptic ulcer disease) 07/26/2013  . Helicobacter pylori ab+ 07/26/2013  . Upper GI bleed 05/23/2013  . HTN (hypertension) 05/23/2013  . Hyperlipidemia 05/23/2013  . Anemia due to acute blood loss 05/23/2013  . Hematemesis 05/23/2013  . Hx of adenomatous colonic polyps 07/29/2010   . Adenomatous polyp of colon 07/29/2010    Past Surgical History:  Procedure Laterality Date  . BIOPSY  10/24/2016   Procedure: BIOPSY;  Surgeon: Daneil Dolin, MD;  [facility];  Service: Endoscopy;;  colon  . BIOPSY  01/06/2018   Procedure: BIOPSY;  Surgeon: Rogene Houston, MD;  Location[facility];  Service: Endoscopy;;  duodenal   . COLONOSCOPY  June 2012   Dr. Gala Romney: adenomatous polyps, due for surveillance if health permits in 2841  . COLONOSCOPY N/A 10/24/2016   Procedure: COLONOSCOPY;  Surgeon: Daneil Dolin, MD;  Location: [facility];  Service: Endoscopy;  Laterality: N/A;  2:00pm  . ESOPHAGOGASTRODUODENOSCOPY N/A 05/24/2013   Dr. Rourk:Prepyloric gastric ulcerations-likely source of hematemesis-likely NSAID-related. H.PYLORI SEROLOGY POSITIVE, TREATED WITH PREVPAC  . ESOPHAGOGASTRODUODENOSCOPY N/A 05/23/2013   LKG:[id number] EGD secondary to much clot and food debris in the stomach  . ESOPHAGOGASTRODUODENOSCOPY N/A 08/16/2013   Dr. Gala Romney: normal esophagus, stomach empty, deformity of antrum. scar present. Previously noted ulcers completed healed. Patent pylorus, normal D1 and D2  . ESOPHAGOGASTRODUODENOSCOPY N/A 01/06/2018   Procedure: ESOPHAGOGASTRODUODENOSCOPY (EGD);  Surgeon: Rogene Houston, MD;  [facility];  Service: Endoscopy;  Laterality: N/A;  . HEMORRHOID SURGERY    . POLYPECTOMY  10/24/2016   Procedure: POLYPECTOMY;  Surgeon: Daneil Dolin, MD;  [facility];  Service: Endoscopy;;  colon  . shoulder       OB History   No obstetric history on file.      Home Medications    Prior to Admission medications   Medication Sig Start Date End Date Taking? Authorizing Provider  allopurinol (ZYLOPRIM) 300 MG tablet Take 300 mg by mouth daily.   Yes [provider]  apixaban (ELIQUIS) 5 MG TABS tablet Take 1 tablet (5 mg total) by mouth 2 (two) times daily. 01/29/18  Yes Strader, Fransisco Hertz, PA-C  Cholecalciferol  (VITAMIN D3) 2000 units TABS Take 1 capsule by mouth 2 (two) times daily.    Yes [provider]  diltiazem (CARDIZEM) 30 MG tablet Take 1 tablet (30 mg total) by mouth 2 (two) times daily. 02/09/18 02/04/19 Yes Strader, Merna, PA-C  fish oil-omega-3 fatty acids 1000 MG capsule Take 1 g by mouth daily.     Yes [provider]  ipratropium (ATROVENT) 0.06 % nasal spray Place 2 sprays into both nostrils daily as needed for rhinitis.  04/29/13  Yes [provider]  Polyethyl Glycol-Propyl Glycol (SYSTANE OP) Apply 1 drop to eye 4 (four) times daily as needed (dry eyes).   Yes [provider]  simvastatin (ZOCOR) 20 MG tablet Take 2 tablets (40 mg total) by mouth daily. Patient taking differently: Take 20 mg by mouth daily.  02/09/18  Yes Strader, [place] M, PA-C  terazosin (HYTRIN) 5 MG capsule Take 5 mg by mouth daily.    Yes [provider]  vitamin B-12 (CYANOCOBALAMIN) 1000 MCG tablet Take 1,000 mcg by mouth daily. Patient states that he takes 3000 mcg daily   Yes [provider]  predniSONE (DELTASONE) 5 MG tablet Take 5 mg by mouth 4 (four) times daily.    [provider]    Family History Family History  Problem Relation Age of Onset  . Stroke Mother   . Stroke Father   . Pancreatic cancer Sister   . Leukemia Brother   . Lung cancer Sister   . Colon cancer Neg Hx     Social History Social History   Tobacco Use  . Smoking status: Former Smoker    Packs/day: 2.00    Years: 50.00    Pack years: 100.00    Types: Cigarettes    Last attempt to quit: 07/28/2000    Years since quitting: 17.9  . Smokeless tobacco: Former Systems developer    Quit date: 02/25/1999  Substance Use Topics  . Alcohol use: No    Comment: couple glasses wine/week, occ beer or rmixed drink  . Drug use: No    Frequency: 2.0 times per week     Allergies   Patient has no known allergies.   Review of Systems Review of Systems  Constitutional: Negative  for chills and fever.  HENT: Negative for congestion, rhinorrhea and sore throat.   Eyes: Negative for visual disturbance.  Respiratory: Negative for cough and shortness of breath.   Cardiovascular: Positive for palpitations. Negative for chest pain and leg swelling.  Gastrointestinal: Positive for diarrhea. Negative for abdominal pain, nausea and vomiting.  Genitourinary: Negative for dysuria.  Musculoskeletal: Negative for back pain and neck pain.  Skin: Negative for rash.  Neurological: Negative for dizziness, light-headedness and headaches.  Hematological: Bruises/bleeds easily.  Psychiatric/Behavioral: Negative for confusion.     Physical Exam Updated Vital Signs BP 139/68 (BP Location: Left Arm)   Pulse (!) 52   Temp 97.9 F (36.6 C) (Oral)  Resp 18   Ht 1.727 m (5\' 8" )   Wt 89.8 kg   SpO2 94%   BMI 30.11 kg/m   Physical Exam Vitals signs and nursing note reviewed.  Constitutional:      General: He is not in acute distress.    Appearance: Normal appearance. He is well-developed.  HENT:     Head: Normocephalic and atraumatic.  Eyes:     Extraocular Movements: Extraocular movements intact.     Conjunctiva/sclera: Conjunctivae normal.     Pupils: Pupils are equal, round, and reactive to light.  Neck:     Musculoskeletal: Normal range of motion and neck supple.  Cardiovascular:     Rate and Rhythm: Tachycardia present. Rhythm irregular.     Heart sounds: No murmur.  Pulmonary:     Effort: Pulmonary effort is normal. No respiratory distress.     Breath sounds: Normal breath sounds.  Abdominal:     General: There is no distension.     Palpations: Abdomen is soft.     Tenderness: There is no abdominal tenderness.  Musculoskeletal:        General: No swelling.  Skin:    General: Skin is warm and dry.     Capillary Refill: Capillary refill takes less than 2 seconds.  Neurological:     General: No focal deficit present.     Mental Status: He is alert and  oriented to person, place, and time.      ED Treatments / Results  Labs (all labs ordered are listed, but only abnormal results are displayed) Labs Reviewed  CBC - Abnormal; Notable for the following components:      Result Value   RBC 3.93 (*)    MCV 104.1 (*)    MCH 36.4 (*)    Platelets 142 (*)    All other components within normal limits  BASIC METABOLIC PANEL - Abnormal; Notable for the following components:   Potassium 3.3 (*)    Glucose, Bld 125 (*)    All other components within normal limits  T4, FREE - Abnormal; Notable for the following components:   Free T4 0.75 (*)    All other components within normal limits  TSH  VITAMIN B12  FOLATE  BASIC METABOLIC PANEL  MAGNESIUM    EKG EKG Interpretation  Date/Time:  Friday Jul 02 2018 11:03:10 EDT Ventricular Rate:  132 PR Interval:    QRS Duration: 133 QT Interval:  351 QTC Calculation: 521 R Axis:   -61 Text Interpretation:  Atrial fibrillation RBBB and LAFB Probable posterior infarct, acute No significant change since last tracing Suspect rate related ischemia No physical chest pain Confirmed by Fredia Sorrow [phone]) on 07/02/2018 11:16:21 AM   Radiology Dg Chest Port 1 View  Result Date: 07/02/2018 CLINICAL DATA:  Palpitations for EXAM: PORTABLE CHEST - 1 VIEW COMPARISON:  01/05/2018 FINDINGS: Low lung volumes with resultant crowding of bronchovascular structures. No definite infiltrate or overt edema. Heart size upper limits normal for technique. Aortic Atherosclerosis (ICD10-170.0). No definite effusion. No pneumothorax. Visualized bones unremarkable. IMPRESSION: Low volumes. No definite acute disease. Electronically Signed   By: Lucrezia Europe M.D.   On: 07/02/2018 12:23    Procedures Procedures (including critical care time)  Medications Ordered in ED Medications  sodium chloride 0.9 % bolus 500 mL (0 mLs Intravenous Stopped 07/02/18 1226)  diltiazem (CARDIZEM) 1 mg/mL load via infusion 10 mg (10 mg  Intravenous Bolus from Bag 07/02/18 1147)  potassium chloride SA (K-DUR) CR tablet  40 mEq (40 mEq Oral Given 07/02/18 1508)     Initial Impression / Assessment and Plan / ED Course  I have reviewed the triage vital signs and the nursing notes.  Pertinent labs & imaging results that were available during my care of the patient were reviewed by me and considered in my medical decision making (see chart for details).        Duplicate note.  Final Clinical Impressions(s) / ED Diagnoses   Final diagnoses:  Atrial flutter with rapid ventricular response [facility])    ED Discharge Orders    None      See the other note this is a duplicate note.   Fredia Sorrow, MD 07/04/18 [phone]

## 2018-07-02 NOTE — H&P (Signed)
History and Physical  Juan Quinn NFA:[id number] DOB: 1930-04-30 DOA: 07/02/2018   PCP: Lucia Gaskins, MD   Patient coming from: Home  Chief Complaint: palpitations  HPI:  Juan Quinn is a 85 y.o. male with medical history of paroxysmal atrial fibrillation, BPH, hypertension, hyperlipidemia, gouty arthritis, chronic diarrhea presenting with palpitations.  Patient states that he has been having intermittent palpitations when he gets up to go to the bathroom.  Is been going on for the better part of a month.  He does not happen all the time but he feels like it is been increasing in frequency.  Today, the patient returned from the bathroom feeling palpitations.  His wife checked his heart rate and it was 155 with a blood pressure 85/70.  EMS was activated.  The patient himself denies any recent fevers, chills, chest pain, shortness breath, nausea, vomiting, diarrhea, abd pain, dysuria, hematuria, dizziness, syncope.  He endorses compliance with all his medications. In the emergency department, the patient was noted to have atrial fibrillation with RVR heart rate in the 130s.  The patient was hemodynamically stable with oxygen saturation 97% room air.  He was given a diltiazem bolus and then started on a diltiazem drip.  He spontaneously converted to sinus rhythm.  Case was discussed with cardiology.  He would be admitted for observation.  Assessment/Plan: Paroxysmal atrial fibrillation, with RVR -Patient spontaneously converted back to sinus rhythm -Continue apixaban -Convert to oral diltiazem home dosing -TSH -01/06/2018 echo EF 65 Percent, No WMA  Chronic diarrhea -The patient follows GI at [facility] -Patient has had an extensive neg work-up -suspected to have IBS-D with extensive negative work up (labs, stool tests, imaging, colonoscopy) and symptoms starting after an acute gastroenteritis. -he was placed on trial of Viberzi and to restart questran 2 grams at hs, 1 gram in am prn   Essential hypertension -Continue diltiazem and terazosin  Hyperlipidemia -Continue statin  BPH -Continue terazosin  Gouty arthritis  -Continue allopurinol  Hypokalemia -replete -check mag  Thrombocytopenia -check TSH -I69 -folic acid        Past Medical History:  Diagnosis Date  . Adenomatous colon polyp 2000   Surveillance due 2017  . Atrial flutter ([place])    a. diagnosed in 12/2017  . Helicobacter pylori antibody positive 2015   treatment with Prevpac  . HTN (hypertension)   . Hypercholesteremia   . Squamous cell carcinoma    Past Surgical History:  Procedure Laterality Date  . BIOPSY  10/24/2016   Procedure: BIOPSY;  Surgeon: Daneil Dolin, MD;  [facility];  Service: Endoscopy;;  colon  . BIOPSY  01/06/2018   Procedure: BIOPSY;  Surgeon: Rogene Houston, MD;  [facility];  Service: Endoscopy;;  duodenal   . COLONOSCOPY  June 2012   Dr. Gala Romney: adenomatous polyps, due for surveillance if health permits in 6295  . COLONOSCOPY N/A 10/24/2016   Procedure: COLONOSCOPY;  Surgeon: Daneil Dolin, MD;  [facility];  Service: Endoscopy;  Laterality: N/A;  2:00pm  . ESOPHAGOGASTRODUODENOSCOPY N/A 05/24/2013   Dr. Rourk:Prepyloric gastric ulcerations-likely source of hematemesis-likely NSAID-related. H.PYLORI SEROLOGY POSITIVE, TREATED WITH PREVPAC  . ESOPHAGOGASTRODUODENOSCOPY N/A 05/23/2013   MWU:[id number] EGD secondary to much clot and food debris in the stomach  . ESOPHAGOGASTRODUODENOSCOPY N/A 08/16/2013   Dr. Gala Romney: normal esophagus, stomach empty, deformity of antrum. scar present. Previously noted ulcers completed healed. Patent pylorus, normal D1 and D2  . ESOPHAGOGASTRODUODENOSCOPY N/A 01/06/2018  Procedure: ESOPHAGOGASTRODUODENOSCOPY (EGD);  Surgeon: Rogene Houston, MD;  [facility];  Service: Endoscopy;  Laterality: N/A;  . HEMORRHOID SURGERY    . POLYPECTOMY  10/24/2016   Procedure: POLYPECTOMY;   Surgeon: Daneil Dolin, MD;  [facility];  Service: Endoscopy;;  colon  . shoulder     Social History:  reports that he quit smoking about 17 years ago. His smoking use included cigarettes. He has a 100.00 pack-year smoking history. He quit smokeless tobacco use about 19 years ago. He reports that he does not drink alcohol or use drugs.   Family History  Problem Relation Age of Onset  . Stroke Mother   . Stroke Father   . Pancreatic cancer Sister   . Leukemia Brother   . Lung cancer Sister   . Colon cancer Neg Hx      No Known Allergies   Prior to Admission medications   Medication Sig Start Date End Date Taking? Authorizing Provider  allopurinol (ZYLOPRIM) 300 MG tablet Take 300 mg by mouth daily.   Yes [provider]  apixaban (ELIQUIS) 5 MG TABS tablet Take 1 tablet (5 mg total) by mouth 2 (two) times daily. 01/29/18  Yes Strader, Fransisco Hertz, PA-C  Cholecalciferol (VITAMIN D3) 2000 units TABS Take 1 capsule by mouth 2 (two) times daily.    Yes [provider]  diltiazem (CARDIZEM) 30 MG tablet Take 1 tablet (30 mg total) by mouth 2 (two) times daily. 02/09/18 02/04/19 Yes Strader, [place], PA-C  fish oil-omega-3 fatty acids 1000 MG capsule Take 1 g by mouth daily.     Yes [provider]  ipratropium (ATROVENT) 0.06 % nasal spray Place 2 sprays into both nostrils daily as needed for rhinitis.  04/29/13  Yes [provider]  Polyethyl Glycol-Propyl Glycol (SYSTANE OP) Apply 1 drop to eye 4 (four) times daily as needed (dry eyes).   Yes [provider]  simvastatin (ZOCOR) 20 MG tablet Take 2 tablets (40 mg total) by mouth daily. Patient taking differently: Take 20 mg by mouth daily.  02/09/18  Yes Strader, [place] M, PA-C  terazosin (HYTRIN) 5 MG capsule Take 5 mg by mouth daily.    Yes [provider]  vitamin B-12 (CYANOCOBALAMIN) 1000 MCG tablet Take 1,000 mcg by mouth daily. Patient states that he takes 3000  mcg daily   Yes [provider]  cephALEXin (KEFLEX) 500 MG capsule Take 1 capsule (500 mg total) by mouth 3 (three) times daily. Patient not taking: Reported on 07/02/2018 02/10/18   Rogene Houston, MD  metroNIDAZOLE (FLAGYL) 500 MG tablet Take 1 tablet (500 mg total) by mouth 3 (three) times daily. Patient not taking: Reported on 07/02/2018 02/10/18   Rogene Houston, MD  predniSONE (DELTASONE) 5 MG tablet Take 5 mg by mouth 4 (four) times daily.    [provider]    Review of Systems:  Constitutional:  No weight loss, night sweats, Fevers, chills, fatigue.  Head&Eyes: No headache.  No vision loss.  No eye pain or scotoma ENT:  No Difficulty swallowing,Tooth/dental problems,Sore throat,  No ear ache, post nasal drip,  Cardio-vascular:  No chest pain, Orthopnea, PND, swelling in lower extremities,  dizziness, palpitations  GI:  No  abdominal pain, nausea, vomiting, diarrhea, loss of appetite, hematochezia, melena, heartburn, indigestion, Resp:  No shortness of breath with exertion or at rest. No cough. No coughing up of blood .No wheezing.No chest wall deformity  Skin:  no rash or lesions.  GU:  no dysuria, change in color of urine, no urgency or frequency. No flank pain.  Musculoskeletal:  No joint pain or swelling. No decreased range of motion. No back pain.  Psych:  No change in mood or affect. No depression or anxiety. Neurologic: No headache, no dysesthesia, no focal weakness, no vision loss. No syncope  Physical Exam: Vitals:   07/02/18 1300 07/02/18 1330 07/02/18 1400 07/02/18 1430  BP: 129/62 132/61 130/62 134/61  Pulse: 64 60 (!) 57 (!) 57  Resp: 18 (!) 21 19 (!) 21  Temp:      TempSrc:      SpO2: 92% 96% 95% 95%  Weight:      Height:       General:  A&O x 3, NAD, nontoxic, pleasant/cooperative Head/Eye: No conjunctival hemorrhage, no icterus, [place]/AT, No nystagmus ENT:  No icterus,  No thrush, good dentition, no pharyngeal exudate Neck:  No  masses, no lymphadenpathy, no bruits CV:  RRR, no rub, no gallop, no S3 Lung:  CTAB, good air movement, no wheeze, no rhonchi Abdomen: soft/NT, +BS, nondistended, no peritoneal signs Ext: No cyanosis, No rashes, No petechiae, No lymphangitis, No edema Neuro: CNII-XII intact, strength 4/5 in bilateral upper and lower extremities, no dysmetria  Labs on Admission:  Basic Metabolic Panel: Recent Labs  Lab 07/02/18 1128  NA 143  K 3.3*  CL 110  CO2 25  GLUCOSE 125*  BUN 15  CREATININE 0.87  CALCIUM 9.5   Liver Function Tests: No results for input(s): AST, ALT, ALKPHOS, BILITOT, PROT, ALBUMIN in the last 168 hours. No results for input(s): LIPASE, AMYLASE in the last 168 hours. No results for input(s): AMMONIA in the last 168 hours. CBC: Recent Labs  Lab 07/02/18 1128  WBC 6.7  HGB 14.3  HCT 40.9  MCV 104.1*  PLT 142*   Coagulation Profile: No results for input(s): INR, PROTIME in the last 168 hours. Cardiac Enzymes: No results for input(s): CKTOTAL, CKMB, CKMBINDEX, TROPONINI in the last 168 hours. BNP: Invalid input(s): POCBNP CBG: No results for input(s): GLUCAP in the last 168 hours. Urine analysis: No results found for: COLORURINE, APPEARANCEUR, LABSPEC, PHURINE, GLUCOSEU, HGBUR, BILIRUBINUR, KETONESUR, PROTEINUR, UROBILINOGEN, NITRITE, LEUKOCYTESUR Sepsis Labs: @LABRCNTIP (procalcitonin:4,lacticidven:4) )No results found for this or any previous visit (from the past 240 hour(s)).   Radiological Exams on Admission: Dg Chest Port 1 View  Result Date: 07/02/2018 CLINICAL DATA:  Palpitations for EXAM: PORTABLE CHEST - 1 VIEW COMPARISON:  01/05/2018 FINDINGS: Low lung volumes with resultant crowding of bronchovascular structures. No definite infiltrate or overt edema. Heart size upper limits normal for technique. Aortic Atherosclerosis (ICD10-170.0). No definite effusion. No pneumothorax. Visualized bones unremarkable. IMPRESSION: Low volumes. No definite acute disease.  Electronically Signed   By: Lucrezia Europe M.D.   On: 07/02/2018 12:23    EKG: Independently reviewed. afib with RVR, RBBB    Time spent:60 minutes Code Status:   FULL Family Communication: spouse updated on phone Disposition Plan: expect 1 day hospitalization Consults called: phone consult with cards DVT Prophylaxis:apixaban  Orson Eva, DO  [facility] Pager [phone]  If 7PM-7AM, please contact night-coverage [email or web address] Password [facility] 07/02/2018, 2:55 PM

## 2018-07-03 DIAGNOSIS — D696 Thrombocytopenia, unspecified: Secondary | ICD-10-CM | POA: Diagnosis not present

## 2018-07-03 DIAGNOSIS — I4891 Unspecified atrial fibrillation: Secondary | ICD-10-CM | POA: Diagnosis not present

## 2018-07-03 DIAGNOSIS — I10 Essential (primary) hypertension: Secondary | ICD-10-CM | POA: Diagnosis not present

## 2018-07-03 LAB — BASIC METABOLIC PANEL
Anion gap: 8 (ref 5–15)
BUN: 15 mg/dL (ref 8–23)
CO2: 24 mmol/L (ref 22–32)
Calcium: 8.9 mg/dL (ref 8.9–10.3)
Chloride: 109 mmol/L (ref 98–111)
Creatinine, Ser: 0.87 mg/dL (ref 0.61–1.24)
GFR calc Af Amer: >60 mL/min (ref >60)
GFR calc non Af Amer: >60 mL/min (ref >60)
Glucose, Bld: 98 mg/dL (ref 70–99)
Potassium: 3.7 mmol/L (ref 3.5–5.1)
Sodium: 141 mmol/L (ref 135–145)

## 2018-07-03 LAB — MAGNESIUM: Magnesium: 2 mg/dL (ref 1.7–2.4)

## 2018-07-03 NOTE — Discharge Summary (Signed)
Physician Discharge Summary  Juan Quinn:[id number] DOB: May 23, 1930 DOA: 07/02/2018  PCP: Lucia Gaskins, MD  Admit date: 07/02/2018 Discharge date: 07/03/2018  Admitted From: Home Disposition:  Home   Recommendations for Outpatient Follow-up:  1. Follow up with PCP in 1-2 weeks 2. Please obtain BMP/CBC in one week     Discharge Condition: Stable CODE STATUS: FULL Diet recommendation: Heart Healthy    Brief/Interim Summary: 85 y.o. male with medical history of paroxysmal atrial fibrillation, BPH, hypertension, hyperlipidemia, gouty arthritis, chronic diarrhea presenting with palpitations.  Patient states that he has been having intermittent palpitations when he gets up to go to the bathroom.  It has been going on for the better part of a month.  He does not happen all the time but he feels like it is been increasing in frequency.  On 07/02/18, the patient returned from the bathroom feeling palpitations.  His wife checked his heart rate and it was 155 with a blood pressure 85/70.  EMS was activated.  The patient was noted to have atrial fibrillation with RVR with heart rate in the 130s in the emergency department, the patient was given a diltiazem bolus and started on diltiazem drip.  He spontaneously converted back to sinus rhythm after approximately 2 hours.  He was subsequently transitioned to oral diltiazem.  Since that time, the patient has been having some mild bradycardia with heart rates 50-55.  The patient has been completely asymptomatic with regard to the bradycardia.  He did not exhibit any chronotropic incompetence as his heart rate increased into the upper 70s with ambulation.  The case was discussed with cardiology, and the patient was stable to be discharged with short-term follow-up.  He has a virtual cardiology visit scheduled for 07/09/2018.  At that time, he may need further cardiac monitoring versus referral to electrophysiology.  Discharge Diagnoses:   Paroxysmal  atrial fibrillation, with RVR -Patient spontaneously converted back to sinus rhythm -Continue apixaban -Suspect patient may have a degree of tachybradycardia syndrome -Patient had bradycardia with heart rate 50-55 -No chronotropic incompetence -Case discussed with Dr. Dicie Beam for discharge -Patient has virtual visit scheduled for 07/09/2018--he may need further cardiac monitoring versus antiarrhythmics versus referral to electrophysiology -Convert to oral diltiazem home dosing -TSH--2.826 -01/06/2018 echo EF 65 Percent, No WMA  Chronic diarrhea -The patient follows GI at [facility] -Patient has had an extensive neg work-up -suspected to have IBS-D with extensive negative work up (labs, stool tests, imaging, colonoscopy) and symptoms starting after an acute gastroenteritis. -he was placed on trial of Viberzi and to restart questran 2 grams at hs, 1 gram in am prn  Essential hypertension -Continue diltiazem and terazosin  Hyperlipidemia -Continue statin  BPH -Continue terazosin  Gouty arthritis  -Continue allopurinol  Hypokalemia -repleted -check mag--2.0  Thrombocytopenia -check EHM--0.947 -S96--283 -folic MOQH--47.6 Discharge Instructions   Allergies as of 07/03/2018   No Known Allergies     Medication List    STOP taking these medications   cephALEXin 500 MG capsule Commonly known as:  KEFLEX   metroNIDAZOLE 500 MG tablet Commonly known as:  FLAGYL     TAKE these medications   allopurinol 300 MG tablet Commonly known as:  ZYLOPRIM Take 300 mg by mouth daily.   apixaban 5 MG Tabs tablet Commonly known as:  Eliquis Take 1 tablet (5 mg total) by mouth 2 (two) times daily.   diltiazem 30 MG tablet Commonly known as:  Cardizem Take 1 tablet (30 mg total) by mouth 2 (two)  times daily.   fish oil-omega-3 fatty acids 1000 MG capsule Take 1 g by mouth daily.   ipratropium 0.06 % nasal spray Commonly known as:  ATROVENT Place 2 sprays into both  nostrils daily as needed for rhinitis.   predniSONE 5 MG tablet Commonly known as:  DELTASONE Take 5 mg by mouth 4 (four) times daily.   simvastatin 20 MG tablet Commonly known as:  ZOCOR Take 2 tablets (40 mg total) by mouth daily. What changed:  how much to take   SYSTANE OP Apply 1 drop to eye 4 (four) times daily as needed (dry eyes).   terazosin 5 MG capsule Commonly known as:  HYTRIN Take 5 mg by mouth daily.   vitamin B-12 1000 MCG tablet Commonly known as:  CYANOCOBALAMIN Take 1,000 mcg by mouth daily. Patient states that he takes 3000 mcg daily   Vitamin D3 50 MCG (2000 UT) Tabs Take 1 capsule by mouth 2 (two) times daily.      Follow-up Information    Barrett, Evelene Croon, PA-C Follow up on 07/09/2018.   Specialties:  Cardiology, Radiology Why:  Virtual Office Visit on 07/09/2018 at 1:40 PM. Will be PHONE or [place] visit.  Contact information: [address] [place] [employer] [place] [address] [phone]          No Known Allergies  Consultations:  cardiology   Procedures/Studies: Dg Chest Port 1 View  Result Date: 07/02/2018 CLINICAL DATA:  Palpitations for EXAM: PORTABLE CHEST - 1 VIEW COMPARISON:  01/05/2018 FINDINGS: Low lung volumes with resultant crowding of bronchovascular structures. No definite infiltrate or overt edema. Heart size upper limits normal for technique. Aortic Atherosclerosis (ICD10-170.0). No definite effusion. No pneumothorax. Visualized bones unremarkable. IMPRESSION: Low volumes. No definite acute disease. Electronically Signed   By: Lucrezia Europe M.D.   On: 07/02/2018 12:23        Discharge Exam: Vitals:   07/02/18 2211 07/03/18 0612  BP: (!) 117/53 139/68  Pulse: (!) 56 (!) 52  Resp:    Temp: 98.3 F (36.8 C) 97.9 F (36.6 C)  SpO2: 94% 94%   Vitals:   07/02/18 1536 07/02/18 1947 07/02/18 2211 07/03/18 0612  BP: (!) 159/64  (!) 117/53 139/68  Pulse: (!) 58  (!) 56 (!) 52  Resp: 18     Temp: 97.9 F (36.6 C)  98.3 F (36.8 C)  97.9 F (36.6 C)  TempSrc: Oral  Oral Oral  SpO2: 97% 94% 94% 94%  Weight:      Height:        General: Pt is alert, awake, not in acute distress Cardiovascular: RRR, S1/S2 +, no rubs, no gallops Respiratory: CTA bilaterally, no wheezing, no rhonchi Abdominal: Soft, NT, ND, bowel sounds + Extremities: no edema, no cyanosis   The results of significant diagnostics from this hospitalization (including imaging, microbiology, ancillary and laboratory) are listed below for reference.    Significant Diagnostic Studies: Dg Chest Port 1 View  Result Date: 07/02/2018 CLINICAL DATA:  Palpitations for EXAM: PORTABLE CHEST - 1 VIEW COMPARISON:  01/05/2018 FINDINGS: Low lung volumes with resultant crowding of bronchovascular structures. No definite infiltrate or overt edema. Heart size upper limits normal for technique. Aortic Atherosclerosis (ICD10-170.0). No definite effusion. No pneumothorax. Visualized bones unremarkable. IMPRESSION: Low volumes. No definite acute disease. Electronically Signed   By: Lucrezia Europe M.D.   On: 07/02/2018 12:23     Microbiology: No results found for this or any previous visit (from the past 240 hour(s)).  Labs: Basic Metabolic Panel: Recent Labs  Lab 07/02/18 1128 07/03/18 0627  NA 143 141  K 3.3* 3.7  CL 110 109  CO2 25 24  GLUCOSE 125* 98  BUN 15 15  CREATININE 0.87 0.87  CALCIUM 9.5 8.9  MG  --  2.0   Liver Function Tests: No results for input(s): AST, ALT, ALKPHOS, BILITOT, PROT, ALBUMIN in the last 168 hours. No results for input(s): LIPASE, AMYLASE in the last 168 hours. No results for input(s): AMMONIA in the last 168 hours. CBC: Recent Labs  Lab 07/02/18 1128  WBC 6.7  HGB 14.3  HCT 40.9  MCV 104.1*  PLT 142*   Cardiac Enzymes: No results for input(s): CKTOTAL, CKMB, CKMBINDEX, TROPONINI in the last 168 hours. BNP: Invalid input(s): POCBNP CBG: No results for input(s): GLUCAP in the last 168 hours.  Time coordinating  discharge:  36 minutes  Signed:  Orson Eva, DO [facility] Pager: [phone] 07/03/2018, 8:46 AM

## 2018-07-03 NOTE — Progress Notes (Signed)
PT DISCHARGED TO HOME, IV REMOVED, ANGIO INTACT, VS STABLE, DENIES C/O PAIN, PT VERBALIZED UNDERSTANDING OF ALL INSTRUCTIONS PROVIDED. PT LEFT FLOOR VIA WHEELCHAIR, BELONGINGS PRESENT ACCOMPANIED BY NURSING STAFF. PT. MET BY SPOUSE AT SHORT STAY ENTRANCE FOR TRANSPORTATION HOME.

## 2018-07-08 ENCOUNTER — Telehealth: Payer: Self-pay | Admitting: Physician Assistant

## 2018-07-08 NOTE — Telephone Encounter (Signed)
Virtual Visit Pre-Appointment Phone Call  "(Name), I am calling you today to discuss your upcoming appointment. We are currently trying to limit exposure to the virus that causes COVID-19 by seeing patients at home rather than in the office."  1. "What is the BEST phone number to call the day of the visit?" - include this in appointment notes  2. Do you have or have access to (through a family member/friend) a smartphone with video capability that we can use for your visit?" a. If yes - list this number in appt notes as cell (if different from BEST phone #) and list the appointment type as a VIDEO visit in appointment notes b. If no - list the appointment type as a PHONE visit in appointment notes  3. Confirm consent - "In the setting of the current Covid19 crisis, you are scheduled for a (phone or video) visit with your provider on (date) at (time).  Just as we do with many in-office visits, in order for you to participate in this visit, we must obtain consent.  If you'd like, I can send this to your mychart (if signed up) or email for you to review.  Otherwise, I can obtain your verbal consent now.  All virtual visits are billed to your insurance company just like a normal visit would be.  By agreeing to a virtual visit, we'd like you to understand that the technology does not allow for your provider to perform an examination, and thus may limit your provider's ability to fully assess your condition. If your provider identifies any concerns that need to be evaluated in [place], we will make arrangements to do so.  Finally, though the technology is pretty good, we cannot assure that it will always work on either your or our end, and in the setting of a video visit, we may have to convert it to a phone-only visit.  In either situation, we cannot ensure that we have a secure connection.  Are you willing to proceed?" STAFF: Did the patient verbally acknowledge consent to telehealth visit? Document  YES/NO here: Yes  4. Advise patient to be prepared - "Two hours prior to your appointment, go ahead and check your blood pressure, pulse, oxygen saturation, and your weight (if you have the equipment to check those) and write them all down. When your visit starts, your provider will ask you for this information. If you have an Apple Watch or Kardia device, please plan to have heart rate information ready on the day of your appointment. Please have a pen and paper handy nearby the day of the visit as well."  5. Give patient instructions for MyChart download to smartphone OR [email or web address] as below if video visit (depending on what platform provider is using)  6. Inform patient they will receive a phone call 15 minutes prior to their appointment time (may be from unknown caller ID) so they should be prepared to answer    TELEPHONE CALL NOTE  Juan Quinn has been deemed a candidate for a follow-up tele-health visit to limit community exposure during the Covid-19 pandemic. I spoke with the patient via phone to ensure availability of phone/video source, confirm preferred email & phone number, and discuss instructions and expectations.  I reminded Juan Quinn to be prepared with any vital sign and/or heart rhythm information that could potentially be obtained via home monitoring, at the time of his visit. I reminded Juan Quinn to expect a phone call prior to  his visit.  Juan Quinn 07/08/2018 10:49 AM

## 2018-07-09 ENCOUNTER — Telehealth (INDEPENDENT_AMBULATORY_CARE_PROVIDER_SITE_OTHER): Payer: Medicare HMO | Admitting: Physician Assistant

## 2018-07-09 ENCOUNTER — Encounter: Payer: Self-pay | Admitting: Physician Assistant

## 2018-07-09 VITALS — BP 136/70 | HR 59 | Ht 69.0 in | Wt 200.0 lb

## 2018-07-09 DIAGNOSIS — I4892 Unspecified atrial flutter: Secondary | ICD-10-CM

## 2018-07-09 DIAGNOSIS — I10 Essential (primary) hypertension: Secondary | ICD-10-CM

## 2018-07-09 DIAGNOSIS — R197 Diarrhea, unspecified: Secondary | ICD-10-CM | POA: Diagnosis not present

## 2018-07-09 DIAGNOSIS — K529 Noninfective gastroenteritis and colitis, unspecified: Secondary | ICD-10-CM

## 2018-07-09 DIAGNOSIS — I480 Paroxysmal atrial fibrillation: Secondary | ICD-10-CM | POA: Diagnosis not present

## 2018-07-09 DIAGNOSIS — Z7901 Long term (current) use of anticoagulants: Secondary | ICD-10-CM

## 2018-07-09 NOTE — Progress Notes (Signed)
Virtual Visit via Video Note   This visit type was conducted due to national recommendations for restrictions regarding the COVID-19 Pandemic (e.g. social distancing) in an effort to limit this patient's exposure and mitigate transmission in our community.  Due to his co-morbid illnesses, this patient is at least at moderate risk for complications without adequate follow up.  This format is felt to be most appropriate for this patient at this time.  All issues noted in this document were discussed and addressed.  A limited physical exam was performed with this format.  Please refer to the patient's chart for his consent to telehealth for [facility].  Evaluation Performed:  Follow-up visit  This visit type was conducted due to national recommendations for restrictions regarding the COVID-19 Pandemic (e.g. social distancing).  This format is felt to be most appropriate for this patient at this time.  All issues noted in this document were discussed and addressed.  No physical exam was performed (except for noted visual exam findings with Video Visits).  Please refer to the patient's chart (MyChart message for video visits and phone note for telephone visits) for the patient's consent to telehealth for [facility].  Date:  07/09/2018   ID:  Juan Quinn, DOB [birth year], MRN [id number]  Patient Location:  Home  Provider location:   Off-site  PCP:  Lucia Gaskins, MD  Cardiologist:  Kate Sable, MD 01/07/2018 in-hospital Electrophysiologist:  None   Chief Complaint:  Hospital follow up  History of Present Illness:    Juan Quinn is a 85 y.o. male who presents via audio/video conferencing for a telehealth visit today.     Juan Quinn is an 85 y.o. yo male who has a hx of Aflutter dx 12/2017, nl EF at echo, spont conversion to SR, on Eliquis and Cardizem; chronic diarrhea (GI seeing), HTN, HLD, BPH, gouty arthritis.  Pt admitted 05/08-05/10/2018 for palpitations and  hypotension (85/70-155). He was in Afib, RVR>>SR after IV Dilt, some bradycardia in SR, ?tachy-brady syndrome. Still having problems with the diarrhea, ?IBS-D  He has been very tired, the fatigue is not new. He feels it is related to his diarrhea.   His wife has been checking his BP every day, his HR has been 59-77 when she checked it. BP has been a little high at times, but generally ok.   He is worried about having more episodes.   He has not had palpitations since d/c from the hospital. He did not feel the episode in July, but did in May. Has had some brief palpitations between November and May, did not last long, did not check HR/BP or seek help.   He had an episode last July where he passed out, never new why. No palpitations then. Dx of Aflutter/fib not made till November..   Has not been light-headed or dizzy.   Never gets chest pain.   Wonders why he got this. Has not taken OTC cold meds. Wonders if the diarrhea could have contributed.   The Diarrhea is slowly improving.   The patient does not have symptoms concerning for COVID-19 infection (fever, chills, cough, or new shortness of breath).    Prior CV studies:   The following studies were reviewed today:  ECHO: 01/06/2018 - Left ventricle: The cavity size was normal. Wall thickness was   increased in a pattern of moderate LVH. Systolic function was   vigorous. The estimated ejection fraction was in the range of  65%   to 70%. Wall motion was normal; there were no regional wall   motion abnormalities. The study is not technically sufficient to   allow evaluation of LV diastolic function. - Aortic valve: Mildly calcified annulus. Trileaflet; mildly   thickened leaflets. Valve area (VTI): 3.46 cm^2. Valve area   (Vmax): 3.29 cm^2. - Mitral valve: Mildly calcified annulus. Mildly thickened leaflets   .  Past Medical History:  Diagnosis Date  . Adenomatous colon polyp 2000   Surveillance due 2017  . Atrial flutter ([place])     a. diagnosed in 12/2017  . Helicobacter pylori antibody positive 2015   treatment with Prevpac  . HTN (hypertension)   . Hypercholesteremia   . Squamous cell carcinoma    Past Surgical History:  Procedure Laterality Date  . BIOPSY  10/24/2016   Procedure: BIOPSY;  Surgeon: Daneil Dolin, MD;  [facility];  Service: Endoscopy;;  colon  . BIOPSY  01/06/2018   Procedure: BIOPSY;  Surgeon: Rogene Houston, MD;  Location: [facility];  Service: Endoscopy;;  duodenal  . COLONOSCOPY  June 2012   Dr. Gala Romney: adenomatous polyps, due for surveillance if health permits in [id number]  . COLONOSCOPY N/A 10/24/2016   Procedure: COLONOSCOPY;  Surgeon: Daneil Dolin, MD;  Location: [facility];  Service: Endoscopy;  Laterality: N/A;  2:00pm  . ESOPHAGOGASTRODUODENOSCOPY N/A 05/24/2013   Dr. Rourk:Prepyloric gastric ulcerations-likely source of hematemesis-likely NSAID-related. H.PYLORI SEROLOGY POSITIVE, TREATED WITH PREVPAC  . ESOPHAGOGASTRODUODENOSCOPY N/A 05/23/2013   VWU:[id number] EGD secondary to much clot and food debris in the stomach  . ESOPHAGOGASTRODUODENOSCOPY N/A 08/16/2013   Dr. Gala Romney: normal esophagus, stomach empty, deformity of antrum. scar present. Previously noted ulcers completed healed. Patent pylorus, normal D1 and D2  . ESOPHAGOGASTRODUODENOSCOPY N/A 01/06/2018   Procedure: ESOPHAGOGASTRODUODENOSCOPY (EGD);  Surgeon: Rogene Houston, MD;  [facility];  Service: Endoscopy;  Laterality: N/A;  . HEMORRHOID SURGERY    . POLYPECTOMY  10/24/2016   Procedure: POLYPECTOMY;  Surgeon: Daneil Dolin, MD;  Location: [facility];  Service: Endoscopy;;  colon  . shoulder       Current Meds  Medication Sig  . allopurinol (ZYLOPRIM) 300 MG tablet Take 300 mg by mouth daily.  Marland Kitchen apixaban (ELIQUIS) 5 MG TABS tablet Take 1 tablet (5 mg total) by mouth 2 (two) times daily.  . Cholecalciferol (VITAMIN D3) 2000 units TABS Take 1 capsule by mouth 2 (two) times daily.    Marland Kitchen diltiazem (CARDIZEM) 30 MG tablet Take 1 tablet (30 mg total) by mouth 2 (two) times daily.  . fish oil-omega-3 fatty acids 1000 MG capsule Take 1 g by mouth daily.    Marland Kitchen ipratropium (ATROVENT) 0.06 % nasal spray Place 2 sprays into both nostrils daily as needed for rhinitis.   Vladimir Faster Glycol-Propyl Glycol (SYSTANE OP) Apply 1 drop to eye 4 (four) times daily as needed (dry eyes).  . simvastatin (ZOCOR) 20 MG tablet Take 2 tablets (40 mg total) by mouth daily. (Patient taking differently: Take 20 mg by mouth daily. )  . terazosin (HYTRIN) 5 MG capsule Take 5 mg by mouth daily.   . vitamin B-12 (CYANOCOBALAMIN) 1000 MCG tablet Take 1,000 mcg by mouth daily. Patient states that he takes 3000 mcg daily     Allergies:   Patient has no known allergies.   Social History   Tobacco Use  . Smoking status: Former Smoker    Packs/day: 2.00    Years:  50.00    Pack years: 100.00    Types: Cigarettes    Last attempt to quit: 07/28/2000    Years since quitting: 17.9  . Smokeless tobacco: Former Systems developer    Quit date: 02/25/1999  Substance Use Topics  . Alcohol use: No    Comment: couple glasses wine/week, occ beer or rmixed drink  . Drug use: No    Frequency: 2.0 times per week     Family Hx: The patient's family history includes Leukemia in his brother; Lung cancer in his sister; Pancreatic cancer in his sister; Stroke in his father and mother. There is no history of Colon cancer.  ROS:   Please see the history of present illness.    All other systems reviewed and are negative.   Labs/Other Tests and Data Reviewed:    Recent Labs: 01/05/2018: ALT 13 07/02/2018: Hemoglobin 14.3; Platelets 142; TSH 2.826 07/03/2018: BUN 15; Creatinine, Ser 0.87; Magnesium 2.0; Potassium 3.7; Sodium 141   Recent Lipid Panel No results found for: CHOL, TRIG, HDL, CHOLHDL, LDLCALC, LDLDIRECT  Wt Readings from Last 3 Encounters:  07/09/18 200 lb (90.7 kg)  07/02/18 198 lb (89.8 kg)  02/09/18 202 lb (91.6 kg)      Objective:    Vital Signs:  BP 136/70   Pulse (!) 59   Ht 5\' 9"  (1.753 m)   Wt 200 lb (90.7 kg)   BMI 29.53 kg/m    85 y.o. male in no acute distress.  HEENT: normal for age Skin: Color is good, no diaphoresis  Neck: No JVD seen Pulm: No SOB w/ conversation Neuro: CN 2-12 grossly intact, no focal deficits noted Psych: good affect  ASSESSMENT & PLAN:    1.  PAF/Flutter:  - Pt is compliant w/ short-acting Cardizem and Eliquis - HR is low at times, reluctant to increase the Cardizem - He is interested in seeing EP>>will make the referral - ok to take an extra Cardizem prn for HR >120 if SBP > 110 - if HR up and BP low, go to ER  2. Chronic anticoagulation - compliant w/ Eliquis, no bleeding issues  3. HTN: - generally good control  4. Diarrhea - slowly improving - f/u with GI as scheduled.   COVID-19 Education: The signs and symptoms of COVID-19 were discussed with the patient and how to seek care for testing (follow up with PCP or arrange E-visit).  The importance of social distancing was discussed today.  Patient Risk:   After full review of this patient's clinical status, I feel that they are at least moderate risk at this time.  Time:   Today, I have spent 22   minutes with the patient with telehealth technology discussing cardiac and other health issues.     Medication Adjustments/Labs and Tests Ordered: Current medicines are reviewed at length with the patient today.  Concerns regarding medicines are outlined above.  Tests Ordered: No orders of the defined types were placed in this encounter.  Medication Changes: No orders of the defined types were placed in this encounter.   Disposition:  Follow up with Dr Bronson Ing and with EP  Signed, Rosaria Ferries, PA-C  07/09/2018 11:05 AM    [place]

## 2018-07-09 NOTE — Addendum Note (Signed)
Addended by: Laurine Blazer on: 07/09/2018 01:25 PM   Modules accepted: Orders

## 2018-07-09 NOTE — Patient Instructions (Addendum)
Medication Instructions:   May take an extra Diltiazem as needed for heart rate greater than 120, as long as blood pressure is greater than 110.    Please notify the office if you have to do this.   Continue all other medications.    Labwork: none  Testing/Procedures: none  Follow-Up: Keep already scheduled follow up with Bernerd Pho on 08/17/2018.  Any Other Special Instructions Will Be Listed Below (If Applicable). You have been referred to:  Dr. Lovena Le (Electrophysiology) in our [place] office.    If you need a refill on your cardiac medications before your next appointment, please call your pharmacy.

## 2018-07-15 ENCOUNTER — Encounter: Payer: Self-pay | Admitting: Internal Medicine

## 2018-07-16 ENCOUNTER — Telehealth (INDEPENDENT_AMBULATORY_CARE_PROVIDER_SITE_OTHER): Payer: Medicare HMO | Admitting: Internal Medicine

## 2018-07-16 VITALS — BP 132/68 | HR 62 | Ht 69.0 in | Wt 198.0 lb

## 2018-07-16 DIAGNOSIS — I10 Essential (primary) hypertension: Secondary | ICD-10-CM | POA: Diagnosis not present

## 2018-07-16 DIAGNOSIS — I4892 Unspecified atrial flutter: Secondary | ICD-10-CM | POA: Diagnosis not present

## 2018-07-16 MED ORDER — VERAPAMIL HCL ER 120 MG PO TBCR: 120.0000 mg | EXTENDED_RELEASE_TABLET | Freq: Every day | ORAL | 3 refills | Status: DC

## 2018-07-16 MED ORDER — VERAPAMIL HCL ER 120 MG PO TBCR: 120.0000 mg | EXTENDED_RELEASE_TABLET | Freq: Two times a day (BID) | ORAL | 3 refills | Status: DC

## 2018-07-16 NOTE — Patient Instructions (Addendum)
Medication Instructions: STOP Diltiazem  START [place] SR 120 mg twice a day  Labwork: None today  Procedures/Testing: None today  Follow-Up:  2 months with Dr.Taylor  Any Additional Special Instructions Will Be Listed Below (If Applicable).     If you need a refill on your cardiac medications before your next appointment, please call your pharmacy.      Thank you for choosing [facility] !

## 2018-07-16 NOTE — Progress Notes (Signed)
Electrophysiology TeleHealth Note   Due to national recommendations of social distancing due to COVID 19, an audio/video telehealth visit is felt to be most appropriate for this patient at this time.  See MyChart message from today for the patient's consent to telehealth for [facility].   Date:  07/16/2018   ID:  Juan Quinn, DOB 10-Nov-1930, MRN [id number]  Location: patient's home  Provider location: [address] [place]  Evaluation Performed: Follow-up visit  PCP:  Lucia Gaskins, MD  Cardiologist:  Kate Sable, MD Electrophysiologist:  Dr Lovena Le  Chief Complaint:  "I'm doing alright."  History of Present Illness:    Juan Quinn is a 85 y.o. male who presents via audio/video conferencing for a telehealth visit today. He has a h/o palpitations and atypical atrial flutter/tachycardia as well as atrial fib. He has a RVR. His QT is prolonged. He has not had syncope. His energy level is increased since being placed on calcium channel blocker therapy. He notes problems with diarrhea. He has preserved LV function by echo.    Today, he denies symptoms of palpitations, chest pain, shortness of breath,  lower extremity edema, dizziness, presyncope, or syncope.  The patient is otherwise without complaint today.  The patient denies symptoms of fevers, chills, cough, or new SOB worrisome for COVID 19.  Past Medical History:  Diagnosis Date  . Adenomatous colon polyp 2000   Surveillance due 2017  . Atrial flutter ([place])    a. diagnosed in 12/2017  . Helicobacter pylori antibody positive 2015   treatment with Prevpac  . HTN (hypertension)   . Hypercholesteremia   . Squamous cell carcinoma     Past Surgical History:  Procedure Laterality Date  . BIOPSY  10/24/2016   Procedure: BIOPSY;  Surgeon: Daneil Dolin, MD;  [facility];  Service: Endoscopy;;  colon  . BIOPSY  01/06/2018   Procedure: BIOPSY;  Surgeon: Rogene Houston, MD;  Location: [facility]  END[facility];  Service: Endoscopy;;  duodenal  . COLONOSCOPY  June 2012   Dr. Gala Romney: adenomatous polyps, due for surveillance if health permits in 4332  . COLONOSCOPY N/A 10/24/2016   Procedure: COLONOSCOPY;  Surgeon: Daneil Dolin, MD;  Location: [facility];  Service: Endoscopy;  Laterality: N/A;  2:00pm  . ESOPHAGOGASTRODUODENOSCOPY N/A 05/24/2013   Dr. Rourk:Prepyloric gastric ulcerations-likely source of hematemesis-likely NSAID-related. H.PYLORI SEROLOGY POSITIVE, TREATED WITH PREVPAC  . ESOPHAGOGASTRODUODENOSCOPY N/A 05/23/2013   RJJ:[id number] EGD secondary to much clot and food debris in the stomach  . ESOPHAGOGASTRODUODENOSCOPY N/A 08/16/2013   Dr. Gala Romney: normal esophagus, stomach empty, deformity of antrum. scar present. Previously noted ulcers completed healed. Patent pylorus, normal D1 and D2  . ESOPHAGOGASTRODUODENOSCOPY N/A 01/06/2018   Procedure: ESOPHAGOGASTRODUODENOSCOPY (EGD);  Surgeon: Rogene Houston, MD;  [facility];  Service: Endoscopy;  Laterality: N/A;  . HEMORRHOID SURGERY    . POLYPECTOMY  10/24/2016   Procedure: POLYPECTOMY;  Surgeon: Daneil Dolin, MD;  Location[facility];  Service: Endoscopy;;  colon  . shoulder      Current Outpatient Medications  Medication Sig Dispense Refill  . allopurinol (ZYLOPRIM) 300 MG tablet Take 300 mg by mouth daily.    Marland Kitchen apixaban (ELIQUIS) 5 MG TABS tablet Take 1 tablet (5 mg total) by mouth 2 (two) times daily. 60 tablet 6  . Cholecalciferol (VITAMIN D3) 2000 units TABS Take 1 capsule by mouth 2 (two) times daily.     Marland Kitchen  diltiazem (CARDIZEM) 30 MG tablet Take 1 tablet (30 mg total) by mouth 2 (two) times daily. 180 tablet 3  . fish oil-omega-3 fatty acids 1000 MG capsule Take 1 g by mouth daily.      Marland Kitchen ipratropium (ATROVENT) 0.06 % nasal spray Place 2 sprays into both nostrils daily as needed for rhinitis.     Vladimir Faster Glycol-Propyl Glycol (SYSTANE OP) Apply 1 drop to eye 4 (four) times daily as needed (dry  eyes).    . simvastatin (ZOCOR) 40 MG tablet Take 40 mg by mouth daily.    Marland Kitchen terazosin (HYTRIN) 5 MG capsule Take 5 mg by mouth daily.     . vitamin B-12 (CYANOCOBALAMIN) 1000 MCG tablet Take 1,000 mcg by mouth daily. Patient states that he takes 3000 mcg daily     No current facility-administered medications for this visit.     Allergies:   Patient has no known allergies.   Social History:  The patient  reports that he quit smoking about 17 years ago. His smoking use included cigarettes. He has a 100.00 pack-year smoking history. He quit smokeless tobacco use about 19 years ago. He reports that he does not drink alcohol or use drugs.   Family History:  The patient's  family history includes Leukemia in his brother; Lung cancer in his sister; Pancreatic cancer in his sister; Stroke in his father and mother.   ROS:  Please see the history of present illness.   All other systems are personally reviewed and negative.    Exam:    Vital Signs:  BP 132/68   Pulse 62   Ht 5\' 9"  (1.753 m)   Wt 198 lb (89.8 kg)   BMI 29.24 kg/m    Labs/Other Tests and Data Reviewed:    Recent Labs: 01/05/2018: ALT 13 07/02/2018: Hemoglobin 14.3; Platelets 142; TSH 2.826 07/03/2018: BUN 15; Creatinine, Ser 0.87; Magnesium 2.0; Potassium 3.7; Sodium 141   Wt Readings from Last 3 Encounters:  07/15/18 198 lb (89.8 kg)  07/09/18 200 lb (90.7 kg)  07/02/18 198 lb (89.8 kg)     Other studies personally reviewed:    ASSESSMENT & PLAN:    1.  Atrial fib/flutter/tachy - the patient has multiple different arrhythmias. He does not feel particularly bad although he does have occaisional palpitations. I have recommended a strategy of rate control to start with. Because of his diarrhea, we will switch from short acting cardizem to long acting verapamil 120 bid calan SR. 2. coags  - he will need to continue systemic anti-coagulation for the forseeable future. 3. Diarrhea - he has seen multiple MD's. Hopefully the  verapamil will help. 4. COVID 19 screen The patient denies symptoms of COVID 19 at this time.  The importance of social distancing was discussed today.  Follow-up:  3 months Next remote: n/a  Current medicines are reviewed at length with the patient today.   The patient does not have concerns regarding his medicines.  The following changes were made today:  none  Labs/ tests ordered today include: none No orders of the defined types were placed in this encounter.    Patient Risk:  after full review of this patients clinical status, I feel that they are at moderate risk at this time.  Today, I have spent 30 minutes with the patient with telehealth technology discussing all of the above .    Signed, Cristopher Peru, MD  07/16/2018 11:37 AM     [facility] [address] [place] [address] [phone] (office) [phone] (fax)

## 2018-07-18 ENCOUNTER — Other Ambulatory Visit: Payer: Self-pay

## 2018-07-18 ENCOUNTER — Emergency Department (HOSPITAL_COMMUNITY): Payer: Medicare HMO

## 2018-07-18 ENCOUNTER — Emergency Department (HOSPITAL_COMMUNITY)
Admission: EM | Admit: 2018-07-18 | Discharge: 2018-07-18 | Disposition: A | Discharge status: Home / Self Care | Payer: Medicare HMO | Attending: Emergency Medicine | Admitting: Emergency Medicine

## 2018-07-18 ENCOUNTER — Encounter (HOSPITAL_COMMUNITY): Payer: Self-pay | Admitting: Emergency Medicine

## 2018-07-18 DIAGNOSIS — I10 Essential (primary) hypertension: Secondary | ICD-10-CM | POA: Diagnosis not present

## 2018-07-18 DIAGNOSIS — I4891 Unspecified atrial fibrillation: Secondary | ICD-10-CM | POA: Diagnosis not present

## 2018-07-18 DIAGNOSIS — Z85828 Personal history of other malignant neoplasm of skin: Secondary | ICD-10-CM | POA: Insufficient documentation

## 2018-07-18 DIAGNOSIS — Z87891 Personal history of nicotine dependence: Secondary | ICD-10-CM | POA: Diagnosis not present

## 2018-07-18 DIAGNOSIS — I480 Paroxysmal atrial fibrillation: Secondary | ICD-10-CM | POA: Insufficient documentation

## 2018-07-18 DIAGNOSIS — Z79899 Other long term (current) drug therapy: Secondary | ICD-10-CM | POA: Insufficient documentation

## 2018-07-18 DIAGNOSIS — R002 Palpitations: Secondary | ICD-10-CM | POA: Diagnosis present

## 2018-07-18 DIAGNOSIS — Z7901 Long term (current) use of anticoagulants: Secondary | ICD-10-CM | POA: Diagnosis not present

## 2018-07-18 HISTORY — DX: Noninfective gastroenteritis and colitis, unspecified: K52.9

## 2018-07-18 LAB — BASIC METABOLIC PANEL
Anion gap: 10 (ref 5–15)
BUN: 13 mg/dL (ref 8–23)
CO2: 25 mmol/L (ref 22–32)
Calcium: 9.3 mg/dL (ref 8.9–10.3)
Chloride: 107 mmol/L (ref 98–111)
Creatinine, Ser: 0.99 mg/dL (ref 0.61–1.24)
GFR calc Af Amer: >60 mL/min (ref >60)
GFR calc non Af Amer: >60 mL/min (ref >60)
Glucose, Bld: 126 mg/dL — ABNORMAL HIGH (ref 70–99)
Potassium: 3.6 mmol/L (ref 3.5–5.1)
Sodium: 142 mmol/L (ref 135–145)

## 2018-07-18 LAB — CBC
HCT: 41.9 % (ref 39.0–52.0)
Hemoglobin: 14.4 g/dL (ref 13.0–17.0)
MCH: 36 pg — ABNORMAL HIGH (ref 26.0–34.0)
MCHC: 34.4 g/dL (ref 30.0–36.0)
MCV: 104.8 fL — ABNORMAL HIGH (ref 80.0–100.0)
Platelets: 146 10*3/uL — ABNORMAL LOW (ref 150–400)
RBC: 4 MIL/uL — ABNORMAL LOW (ref 4.22–5.81)
RDW: 12.1 % (ref 11.5–15.5)
WBC: 8.7 10*3/uL (ref 4.0–10.5)
nRBC: 0 % (ref 0.0–0.2)

## 2018-07-18 LAB — PROTIME-INR
INR: 1.1 (ref 0.8–1.2)
Prothrombin Time: 13.6 seconds (ref 11.4–15.2)

## 2018-07-18 LAB — TROPONIN I: Troponin I: <0.03 ng/mL (ref <0.03)

## 2018-07-18 MED ORDER — DILTIAZEM HCL 30 MG PO TABS
30.0000 mg | ORAL_TABLET | Freq: Once | ORAL | Status: AC
  Administered 2018-07-18: 30 mg via ORAL
  Filled 2018-07-18: qty 1

## 2018-07-18 NOTE — ED Notes (Signed)
Recent admission   meds were changed yesterday after a tele eval by doctor   Has not filled rx   Felt heart racing and took BP which was low   Here for eval

## 2018-07-18 NOTE — ED Notes (Signed)
To Rad 

## 2018-07-18 NOTE — ED Notes (Signed)
Ambulated pt with 02 on finger.Oxgyen level started at 95% full lap around nursing 02 was 95% when we returned to room

## 2018-07-18 NOTE — ED Triage Notes (Signed)
Pt reports he was not feeling well earlier and he checked his bp and hr.  BP was 90/57 and hr was 156.  Took extra diltiazem which improved his symptoms some.  Had evisit on Friday and Dr was going to change diltiazem to something else for better rate control, but pharmacy was out of it so change has not yet been made.

## 2018-07-18 NOTE — ED Provider Notes (Signed)
[facility] EMERGENCY DEPARTMENT Provider Note   CSN: [id number] Arrival date & time: 07/18/18  1808    History   Chief Complaint Chief Complaint  Patient presents with  . Tachycardia    HPI Juan Quinn is a 85 y.o. male.     HPI  Pt was seen at [place]. Per pt, c/o gradual onset and persistence of multiple intermittent episodes of "palpitations" for the past several days. Pt describes the palpitations as "fast HR." Pt states he felt his heart "racing" today after he walked outside, and checked it with his home monitor ("156"). Pt states he took an extra short acting diltiazem with "some" improvement of his symptoms. Pt states he has hx of same, on Eliquis. States he was evaluated by Cards MD on 07/16/18 and verapamil SR 120mg  PO BID was prescribed. [facility]," so he continues to take the diltiazem. Denies CP, no SOB/cough, no fevers, no rash, no injury, no abd pain, no N/V/D, no back pain. No known COVID+ exposures.     Past Medical History:  Diagnosis Date  . Adenomatous colon polyp 2000   Surveillance due 2017  . Atrial flutter ([place])    a. diagnosed in 12/2017  . Chronic diarrhea   . Helicobacter pylori antibody positive 2015   treatment with Prevpac  . HTN (hypertension)   . Hypercholesteremia   . Squamous cell carcinoma     Patient Active Problem List   Diagnosis Date Noted  . Essential hypertension 07/02/2018  . Thrombocytopenia ([place]) 07/02/2018  . Atrial fibrillation with RVR ([place]) 01/05/2018  . Abdominal pain, bilateral lower quadrant 12/25/2016  . Diarrhea 11/06/2016  . Abnormal weight loss 11/06/2016  . Diarrhea of presumed infectious origin 10/07/2016  . PUD (peptic ulcer disease) 07/26/2013  . Helicobacter pylori ab+ 07/26/2013  . Upper GI bleed 05/23/2013  . HTN (hypertension) 05/23/2013  . Hyperlipidemia 05/23/2013  . Anemia due to acute blood loss 05/23/2013  . Hematemesis 05/23/2013  . Hx of  adenomatous colonic polyps 07/29/2010  . Adenomatous polyp of colon 07/29/2010    Past Surgical History:  Procedure Laterality Date  . BIOPSY  10/24/2016   Procedure: BIOPSY;  Surgeon: Daneil Dolin, MD;  [facility];  Service: Endoscopy;;  colon  . BIOPSY  01/06/2018   Procedure: BIOPSY;  Surgeon: Rogene Houston, MD;  [facility];  Service: Endoscopy;;  duodenal  . COLONOSCOPY  June 2012   Dr. Gala Romney: adenomatous polyps, due for surveillance if health permits in 0109  . COLONOSCOPY N/A 10/24/2016   Procedure: COLONOSCOPY;  Surgeon: Daneil Dolin, MD;  [facility];  Service: Endoscopy;  Laterality: N/A;  2:00pm  . ESOPHAGOGASTRODUODENOSCOPY N/A 05/24/2013   Dr. Rourk:Prepyloric gastric ulcerations-likely source of hematemesis-likely NSAID-related. H.PYLORI SEROLOGY POSITIVE, TREATED WITH PREVPAC  . ESOPHAGOGASTRODUODENOSCOPY N/A 05/23/2013   NAT:[id number] EGD secondary to much clot and food debris in the stomach  . ESOPHAGOGASTRODUODENOSCOPY N/A 08/16/2013   Dr. Gala Romney: normal esophagus, stomach empty, deformity of antrum. scar present. Previously noted ulcers completed healed. Patent pylorus, normal D1 and D2  . ESOPHAGOGASTRODUODENOSCOPY N/A 01/06/2018   Procedure: ESOPHAGOGASTRODUODENOSCOPY (EGD);  Surgeon: Rogene Houston, MD;  [facility];  Service: Endoscopy;  Laterality: N/A;  . HEMORRHOID SURGERY    . POLYPECTOMY  10/24/2016   Procedure: POLYPECTOMY;  Surgeon: Daneil Dolin, MD;  [facility];  Service: Endoscopy;;  colon  . shoulder  OB History   No obstetric history on file.      Home Medications    Prior to Admission medications   Medication Sig Start Date End Date Taking? Authorizing Provider  allopurinol (ZYLOPRIM) 300 MG tablet Take 300 mg by mouth daily.   Yes [provider]  apixaban (ELIQUIS) 5 MG TABS tablet Take 1 tablet (5 mg total) by mouth 2 (two) times daily. 01/29/18  Yes Strader,  Martorell, PA-C  diltiazem (CARDIZEM) 30 MG tablet Take 30 mg by mouth 2 (two) times a day.   Yes [provider]  terazosin (HYTRIN) 5 MG capsule Take 5 mg by mouth daily.    Yes [provider]  Cholecalciferol (VITAMIN D3) 2000 units TABS Take 1 capsule by mouth 2 (two) times daily.     [provider]  fish oil-omega-3 fatty acids 1000 MG capsule Take 1 g by mouth daily.      [provider]  ipratropium (ATROVENT) 0.06 % nasal spray Place 2 sprays into both nostrils daily as needed for rhinitis.  04/29/13   [provider]  Polyethyl Glycol-Propyl Glycol (SYSTANE OP) Apply 1 drop to eye 4 (four) times daily as needed (dry eyes).    [provider]  simvastatin (ZOCOR) 40 MG tablet Take 40 mg by mouth daily. 02/09/18   [provider]  verapamil (CALAN-SR) 120 MG CR tablet Take 1 tablet (120 mg total) by mouth 2 (two) times daily. 07/16/18   Evans Lance, MD  vitamin B-12 (CYANOCOBALAMIN) 1000 MCG tablet Take 1,000 mcg by mouth daily. Patient states that he takes 3000 mcg daily    [provider]    Family History Family History  Problem Relation Age of Onset  . Stroke Mother   . Stroke Father   . Pancreatic cancer Sister   . Leukemia Brother   . Lung cancer Sister   . Colon cancer Neg Hx     Social History Social History   Tobacco Use  . Smoking status: Former Smoker    Packs/day: 2.00    Years: 50.00    Pack years: 100.00    Types: Cigarettes    Last attempt to quit: 07/28/2000    Years since quitting: 17.9  . Smokeless tobacco: Former Systems developer    Quit date: 02/25/1999  Substance Use Topics  . Alcohol use: No    Comment: couple glasses wine/week, occ beer or rmixed drink  . Drug use: No    Frequency: 2.0 times per week     Allergies   Patient has no known allergies.   Review of Systems Review of Systems ROS: Statement: All systems negative except as marked or noted in the HPI; Constitutional:  Negative for fever and chills. ; ; Eyes: Negative for eye pain, redness and discharge. ; ; ENMT: Negative for ear pain, hoarseness, nasal congestion, sinus pressure and sore throat. ; ; Cardiovascular: +palpitations. Negative for chest pain, diaphoresis, dyspnea and peripheral edema. ; ; Respiratory: Negative for cough, wheezing and stridor. ; ; Gastrointestinal: Negative for nausea, vomiting, diarrhea, abdominal pain, blood in stool, hematemesis, jaundice and rectal bleeding. . ; ; Genitourinary: Negative for dysuria, flank pain and hematuria. ; ; Musculoskeletal: Negative for back pain and neck pain. Negative for swelling and trauma.; ; Skin: Negative for pruritus, rash, abrasions, blisters, bruising and skin lesion.; ; Neuro: Negative for headache, lightheadedness and neck stiffness. Negative for weakness, altered level of consciousness, altered mental status, extremity weakness, paresthesias, involuntary movement, seizure  and syncope.       Physical Exam Updated Vital Signs BP (!) 131/111   Pulse (!) 153   Temp 98 F (36.7 C) (Oral)   Resp 15   Ht 5\' 9"  (1.753 m)   Wt 90 kg   SpO2 94%   BMI 29.30 kg/m    Patient Vitals for the past 24 hrs:  BP Temp Temp src Pulse Resp SpO2 Height Weight  07/18/18 2119 - - - 60 (!) 22 92 % - -  07/18/18 2030 (!) 123/53 - - 66 (!) 22 94 % - -  07/18/18 2000 127/63 - - 70 (!) 23 96 % - -  07/18/18 1945 - - - 73 (!) 24 95 % - -  07/18/18 1900 - - - 78 (!) 24 92 % - -  07/18/18 1830 (!) 131/111 - - (!) 153 15 94 % - -  07/18/18 1825 - - - - - - 5\' 9"  (1.753 m) 90 kg  07/18/18 1824 120/87 98 F (36.7 C) Oral (!) 154 18 95 % - -  07/18/18 1821 120/87 - - - (!) 23 - - -     Physical Exam 1845: Physical examination:  Nursing notes reviewed; Vital signs and O2 SAT reviewed;  Constitutional: Well developed, Well nourished, Well hydrated, In no acute distress; Head:  Normocephalic, atraumatic; Eyes: EOMI, PERRL, No scleral icterus; ENMT: Mouth and pharynx  normal, Mucous membranes moist; Neck: Supple, Full range of motion, No lymphadenopathy; Cardiovascular: Tachycardic regular rate and rhythm, No gallop; Respiratory: Breath sounds clear & equal bilaterally, No wheezes.  Speaking full sentences with ease, Normal respiratory effort/excursion; Chest: Nontender, Movement normal; Abdomen: Soft, Nontender, Nondistended, Normal bowel sounds; Genitourinary: No CVA tenderness; Extremities: Peripheral pulses normal, No tenderness, No edema, No calf edema or asymmetry.; Neuro: AA&Ox3, Major CN grossly intact.  Speech clear. No gross focal motor or sensory deficits in extremities.; Skin: Color normal, Warm, Dry.   ED Treatments / Results  Labs (all labs ordered are listed, but only abnormal results are displayed)   EKG EKG Interpretation  Date/Time:  Sunday Jul 18 2018 18:20:55 EDT  #1 Ventricular Rate:  124 PR Interval:    QRS Duration: 119 QT Interval:  356 QTC Calculation: 512 R Axis:   -63 Text Interpretation:  Atrial flutter RBBB and LAFB similar to prior 5/20 Confirmed by Aletta Edouard [phone]) on 07/18/2018 6:24:07 PM    EKG Interpretation  Date/Time:  Sunday Jul 18 2018 21:21:36 EDT  #2 Ventricular Rate:  64 PR Interval:    QRS Duration: 141 QT Interval:  450 QTC Calculation: 465 R Axis:   -52 Text Interpretation:  Sinus rhythm RBBB and LAFB Left ventricular hypertrophy Since last tracing of earlier today Normal sinus rhythm has replaced Atrial fibrillation Confirmed by Francine Graven [phone]) on 07/18/2018 10:04:45 PM             Radiology   Procedures Procedures (including critical care time)  Medications Ordered in ED Medications  diltiazem (CARDIZEM) tablet 30 mg (30 mg Oral Given 07/18/18 1859)     Initial Impression / Assessment and Plan / ED Course  I have reviewed the triage vital signs and the nursing notes.  Pertinent labs & imaging results that were available during my care of the patient were reviewed by  me and considered in my medical decision making (see chart for details).     MDM Reviewed: previous chart, nursing note and vitals Reviewed previous: labs and ECG Interpretation: labs, ECG  and x-ray Total time providing critical care: 30-74 minutes. This excludes time spent performing separately reportable procedures and services. Consults: cardiology   CRITICAL CARE Performed by: Francine Graven Total critical care time: 35 minutes Critical care time was exclusive of separately billable procedures and treating other patients. Critical care was necessary to treat or prevent imminent or life-threatening deterioration. Critical care was time spent personally by me on the following activities: development of treatment plan with patient and/or surrogate as well as nursing, discussions with consultants, evaluation of patient's response to treatment, examination of patient, obtaining history from patient or surrogate, ordering and performing treatments and interventions, ordering and review of laboratory studies, ordering and review of radiographic studies, pulse oximetry and re-evaluation of patient's condition.   Results for orders placed or performed during the hospital encounter of 70/26/37  Basic metabolic panel  Result Value Ref Range   Sodium 142 135 - 145 mmol/L   Potassium 3.6 3.5 - 5.1 mmol/L   Chloride 107 98 - 111 mmol/L   CO2 25 22 - 32 mmol/L   Glucose, Bld 126 (H) 70 - 99 mg/dL   BUN 13 8 - 23 mg/dL   Creatinine, Ser 0.99 0.61 - 1.24 mg/dL   Calcium 9.3 8.9 - 10.3 mg/dL   GFR calc non Af Amer >60 >60 mL/min   GFR calc Af Amer >60 >60 mL/min   Anion gap 10 5 - 15  CBC  Result Value Ref Range   WBC 8.7 4.0 - 10.5 K/uL   RBC 4.00 (L) 4.22 - 5.81 MIL/uL   Hemoglobin 14.4 13.0 - 17.0 g/dL   HCT 41.9 39.0 - 52.0 %   MCV 104.8 (H) 80.0 - 100.0 fL   MCH 36.0 (H) 26.0 - 34.0 pg   MCHC 34.4 30.0 - 36.0 g/dL   RDW 12.1 11.5 - 15.5 %   Platelets 146 (L) 150 - 400 K/uL    nRBC 0.0 0.0 - 0.2 %  Protime-INR- (order if Patient is taking Coumadin / Warfarin)  Result Value Ref Range   Prothrombin Time 13.6 11.4 - 15.2 seconds   INR 1.1 0.8 - 1.2  Troponin I - ONCE - STAT  Result Value Ref Range   Troponin I <0.03 <0.03 ng/mL   Dg Chest 2 View Result Date: 07/18/2018 CLINICAL DATA:  Not feeling well. EXAM: CHEST - 2 VIEW COMPARISON:  07/02/2018 FINDINGS: The cardio pericardial silhouette is enlarged. The lungs are clear without focal pneumonia, edema, pneumothorax or pleural effusion. Interstitial markings are diffusely coarsened with chronic features. Streaky opacity at the left base compatible with atelectasis or scarring. The visualized bony structures of the thorax are intact. Telemetry leads overlie the chest. IMPRESSION: Low volume film without acute cardiopulmonary findings. Electronically Signed   By: Misty Stanley M.D.   On: 07/18/2018 19:23    Juan Quinn was evaluated in Emergency Department on 07/18/2018 for the symptoms described in the history of present illness. He was evaluated in the context of the global COVID-19 pandemic, which necessitated consideration that the patient might be at risk for infection with the SARS-CoV-2 virus that causes COVID-19. Institutional protocols and algorithms that pertain to the evaluation of patients at risk for COVID-19 are in a state of rapid change based on information released by regulatory bodies including the CDC and federal and state organizations. These policies and algorithms were followed during the patient's care in the ED.      1850:  Pt was admitted for dx afib/RVR on  07/02/2018, converted after 2 hours on IV cardizem bolus and gtt, then experienced hypotension and bradycardia. Will dose another short acting diltiazem PO now.    2110:  Pt has converted to NSR, rates 60-70's. Pt ambulated without CP/SOB or palpitations; NAD, resps easy, gait steady. Feels better and wants to go home now. No clear indication for  admission at this time. T/C returned from [facility] Cards Dr. Radford Pax, case discussed, including:  HPI, pertinent PM/SHx, VS/PE, dx testing, ED course and treatment: agrees with EDP, recommends pt to continue PO cardizem instructions and fill verapamil prescription ASAP, call office for f/u and if pt continues to experience palpitations on meds. Dx and testing, as well as d/w Cards MD, d/w pt.  Questions answered.  Verb understanding, agreeable to d/c home with outpt f/u.        Final Clinical Impressions(s) / ED Diagnoses   Final diagnoses:  None    ED Discharge Orders    None       Francine Graven, DO 07/23/18 2993

## 2018-07-18 NOTE — Discharge Instructions (Addendum)
Try to fill your verapamil prescription given to you by your Cardiologist 2 days ago. Until you are able to get this filled, continue the instructions given to you previously for your diltiazem:  "May take an extra Diltiazem as needed for heart rate greater than 120, as long as blood pressure is greater than 110. Please notify the Cardiology office if you have to do this."  Take your usual prescriptions as previously directed.  Call your Cardiologist on Tuesday to schedule a follow up appointment within the next 3 days.  Return to the Emergency Department immediately sooner if worsening.

## 2018-07-18 NOTE — ED Notes (Signed)
Dr. McManus at bedside. 

## 2018-07-20 ENCOUNTER — Telehealth: Payer: Self-pay | Admitting: Internal Medicine

## 2018-07-20 NOTE — Telephone Encounter (Signed)
Patient had VV w/ Dr.Taylor on Friday. Patient was admitted to [facility] over the weekend w/ A-Fib. Patien'ts wife is questioning if she needs to schedule post hosp appointment. / tg

## 2018-07-20 NOTE — Telephone Encounter (Signed)
Spoke with pt's wife. She stated that she was not able to get pt's verapamil until yesterday as it was a mix[facility]up [facility] about amount to [facility]. Pt's wife also wanted to inform Dr. Lovena Le that she was not able to go into hospital room with pt and he cannot hear very well, so she recalls someone talking about a cardioversion previously and she was questioning if her husband woulld be a candidate of that procedure in order to help keep him from having these fast heart rates continuously. He has not missed any doses of eliquis. Please advise.

## 2018-07-20 NOTE — Telephone Encounter (Signed)
PER discharge summary he is to follow up with cardiology within three days.

## 2018-07-21 NOTE — Telephone Encounter (Signed)
Called to inform wife. No answer, not able to leave voicemail. I will try again tomorrow.

## 2018-07-21 NOTE — Telephone Encounter (Signed)
For now we plan to proceed with a strategy of rate control. Not many good options for rhythm control. GT

## 2018-07-22 NOTE — Telephone Encounter (Signed)
Spoke with pt's wife.Let her know that Dr. Lovena Le wants to try rate control for now. To take his verapamil as prescribed and to call if he continues to have fast rates in the 130's or higher as his medication dosage may need to be adjusted. She voiced understanding of plan.

## 2018-08-04 ENCOUNTER — Telehealth (INDEPENDENT_AMBULATORY_CARE_PROVIDER_SITE_OTHER): Payer: Self-pay | Admitting: *Deleted

## 2018-08-09 NOTE — Telephone Encounter (Signed)
Dr.Rehman has been made aware. 

## 2018-08-11 ENCOUNTER — Telehealth (INDEPENDENT_AMBULATORY_CARE_PROVIDER_SITE_OTHER): Payer: Self-pay | Admitting: Internal Medicine

## 2018-08-11 ENCOUNTER — Telehealth: Payer: Self-pay | Admitting: Cardiovascular Disease

## 2018-08-11 NOTE — Telephone Encounter (Signed)
Pt's wife stated that her husband's heart rate was 128 earlier today but was back to normal when she rechecked it. (87) I advised her to keep a check on it., and call back if it is staying above 100 steadily.  She voiced understanding of plan.

## 2018-08-11 NOTE — Telephone Encounter (Signed)
Spouse left message regarding patient - please call at ph# [phone]

## 2018-08-11 NOTE — Telephone Encounter (Signed)
Heart rate is elevated - BP 130/74 pulse 128 at 10:30 this morning  Please give pt a call [phone]

## 2018-08-12 ENCOUNTER — Telehealth (INDEPENDENT_AMBULATORY_CARE_PROVIDER_SITE_OTHER): Payer: Self-pay | Admitting: *Deleted

## 2018-08-12 NOTE — Telephone Encounter (Signed)
This has been addressed , please refer to telephone encounters.

## 2018-08-12 NOTE — Telephone Encounter (Signed)
On 08/04/2018 , patient;s wife called the office. She states that the patient is not doing well. Within the last week or so he has had bouts of explosive diarrhea. On 5/8 and 5/24 patient was seen in the ED for evaluation of AFib. He has been placed on Verapamil , and this has called Constipation.  Wife says that the gas is tremendous, and at times there is fecal seepage.Abdomen is very distended. She says that he has a OV with PA on 08/17/18 and a appointment with tDr.Taylor on Jackson 22. Both are wondering if his problem is Cardiac verses GI. She was advised that this would be shared with Dr.Rehman.   Per Dr.Rehman the patient should see his PCP to make sure there is nothing acute. He will see him in the office week after next.  Receptionist made aware.

## 2018-08-17 ENCOUNTER — Ambulatory Visit (INDEPENDENT_AMBULATORY_CARE_PROVIDER_SITE_OTHER): Payer: Medicare HMO

## 2018-08-17 ENCOUNTER — Encounter: Payer: Self-pay | Admitting: Student

## 2018-08-17 ENCOUNTER — Ambulatory Visit (INDEPENDENT_AMBULATORY_CARE_PROVIDER_SITE_OTHER): Payer: Medicare HMO | Admitting: Student

## 2018-08-17 ENCOUNTER — Other Ambulatory Visit: Payer: Self-pay

## 2018-08-17 VITALS — BP 106/59 | HR 64 | Temp 97.6°F | Ht 69.0 in | Wt 207.0 lb

## 2018-08-17 DIAGNOSIS — E785 Hyperlipidemia, unspecified: Secondary | ICD-10-CM | POA: Diagnosis not present

## 2018-08-17 DIAGNOSIS — I4892 Unspecified atrial flutter: Secondary | ICD-10-CM | POA: Diagnosis not present

## 2018-08-17 DIAGNOSIS — R002 Palpitations: Secondary | ICD-10-CM

## 2018-08-17 DIAGNOSIS — R0609 Other forms of dyspnea: Secondary | ICD-10-CM | POA: Diagnosis not present

## 2018-08-17 NOTE — Patient Instructions (Addendum)
Medication Instructions:  Your physician recommends that you continue on your current medications as directed. Please refer to the Current Medication list given to you today.  If you need a refill on your cardiac medications before your next appointment, please call your pharmacy.   Lab work: NONE  If you have labs (blood work) drawn today and your tests are completely normal, you will receive your results only by: Marland Kitchen MyChart Message (if you have MyChart) OR . A paper copy in the mail If you have any lab test that is abnormal or we need to change your treatment, we will call you to review the results.  Testing/Procedures: Your physician has recommended that you wear an 2 week event monitor. Event monitors are medical devices that record the heart's electrical activity. Doctors most often [place] these monitors to diagnose arrhythmias. Arrhythmias are problems with the speed or rhythm of the heartbeat. The monitor is a small, portable device. You can wear one while you do your normal daily activities. This is usually used to diagnose what is causing palpitations/syncope (passing out).    Follow-Up: At [facility], you and your health needs are our priority.  As part of our continuing mission to provide you with exceptional heart care, we have created designated Provider Care Teams.  These Care Teams include your primary Cardiologist (physician) and Advanced Practice Providers (APPs -  Physician Assistants and Nurse Practitioners) who all work together to provide you with the care you need, when you need it. You will need a follow up appointment as planned.  Please call our office 2 months in advance to schedule this appointment.  You may see Kate Sable, MD or one of the following Advanced Practice Providers on your designated Care Team:   Bernerd Pho, PA-C [facility]) . Ermalinda Barrios, PA-C ([place])  Any Other Special Instructions Will Be Listed Below (If  Applicable). Thank you for choosing [facility]!

## 2018-08-17 NOTE — Progress Notes (Signed)
Cardiology Office Note    Date:  08/17/2018   ID:  Juan Quinn, DOB 04/29/1930, MRN [id number]  PCP:  Lucia Gaskins, MD  Cardiologist: Kate Sable, MD   EP: Dr. Lovena Le  Chief Complaint  Patient presents with  . Follow-up    fatigue    History of Present Illness:    Juan Quinn is a 85 y.o. male with past medical history of paroxysmal atrial flutter/fibrillation (diagnosed in 12/2017), HTN, and HLD who presents for a follow-up visit for recurrent palpitations.   Had a telehealth visit with Rosaria Ferries, PA-C on 07/09/2018 following an admission for evaluation of palpitations and was found to be in atrial fibrillation with RVR. He spontaneously converted back to normal sinus rhythm following administration of IV Cardizem. He was continued on Eliquis and Cardizem 30 mg twice daily at the time of his office visit with EP referral recommended. He followed up with Dr. Lovena Le on 07/16/2018 and denied any recent symptoms.  Was recommended a rate control strategy be pursued at that time and he was switched from short acting Cardizem to long-acting Verapamil 120 mg twice daily.  In the interim, he presented to the ED on 07/18/2018 and reported worsening palpitations. He had not yet been able to pick up his prescription for Verapamil as the pharmacy was out of the medication. After being started on IV Cardizem he converted to NSR and was discharged home.  In talking with the patient today, he denies any recurrent palpitations but notes he remains more fatigued than "normal". He questions if this is due to his arrhythmia or "old age". He experiences intermittent dyspnea on exertion. Denies any associated chest pain, orthopnea, PND, or lower extremity edema.   Reports good compliance with his current medication regimen. Denies missing any recent doses of Eliquis. Says he had chronic diarrhea prior to being started on Verapamil and thinks the medication has helped with this.    Past Medical  History:  Diagnosis Date  . Adenomatous colon polyp 2000   Surveillance due 2017  . Atrial flutter ([place])    a. diagnosed in 12/2017  . Chronic diarrhea   . Helicobacter pylori antibody positive 2015   treatment with Prevpac  . HTN (hypertension)   . Hypercholesteremia   . Squamous cell carcinoma     Past Surgical History:  Procedure Laterality Date  . BIOPSY  10/24/2016   Procedure: BIOPSY;  Surgeon: Daneil Dolin, MD;  [facility];  Service: Endoscopy;;  colon  . BIOPSY  01/06/2018   Procedure: BIOPSY;  Surgeon: Rogene Houston, MD;  [facility];  Service: Endoscopy;;  duodenal  . COLONOSCOPY  June 2012   Dr. Gala Romney: adenomatous polyps, due for surveillance if health permits in 7001  . COLONOSCOPY N/A 10/24/2016   Procedure: COLONOSCOPY;  Surgeon: Daneil Dolin, MD;  [facility];  Service: Endoscopy;  Laterality: N/A;  2:00pm  . ESOPHAGOGASTRODUODENOSCOPY N/A 05/24/2013   Dr. Rourk:Prepyloric gastric ulcerations-likely source of hematemesis-likely NSAID-related. H.PYLORI SEROLOGY POSITIVE, TREATED WITH PREVPAC  . ESOPHAGOGASTRODUODENOSCOPY N/A 05/23/2013   [facility]:SWHQPRFFMB EGD secondary to much clot and food debris in the stomach  . ESOPHAGOGASTRODUODENOSCOPY N/A 08/16/2013   Dr. Gala Romney: normal esophagus, stomach empty, deformity of antrum. scar present. Previously noted ulcers completed healed. Patent pylorus, normal D1 and D2  . ESOPHAGOGASTRODUODENOSCOPY N/A 01/06/2018   Procedure: ESOPHAGOGASTRODUODENOSCOPY (EGD);  Surgeon: Rogene Houston, MD;  [facility];  Service: Endoscopy;  Laterality: N/A;  .  HEMORRHOID SURGERY    . POLYPECTOMY  10/24/2016   Procedure: POLYPECTOMY;  Surgeon: Daneil Dolin, MD;  [facility];  Service: Endoscopy;;  colon  . shoulder      Current Medications: Outpatient Medications Prior to Visit  Medication Sig Dispense Refill  . allopurinol (ZYLOPRIM) 300 MG tablet Take 300 mg by mouth  daily.    Marland Kitchen apixaban (ELIQUIS) 5 MG TABS tablet Take 1 tablet (5 mg total) by mouth 2 (two) times daily. 60 tablet 6  . Cholecalciferol (VITAMIN D3) 2000 units TABS Take 2 capsules by mouth daily.     . fish oil-omega-3 fatty acids 1000 MG capsule Take 1 g by mouth daily.      Marland Kitchen ipratropium (ATROVENT) 0.06 % nasal spray Place 2 sprays into both nostrils daily as needed for rhinitis.     Vladimir Faster Glycol-Propyl Glycol (SYSTANE OP) Apply 1 drop to eye 4 (four) times daily as needed (dry eyes).    . simvastatin (ZOCOR) 40 MG tablet Take 40 mg by mouth every evening.     . tamsulosin (FLOMAX) 0.4 MG CAPS capsule Take 0.4 mg by mouth.    . verapamil (CALAN-SR) 120 MG CR tablet Take 1 tablet (120 mg total) by mouth 2 (two) times daily. 180 tablet 3  . vitamin B-12 (CYANOCOBALAMIN) 1000 MCG tablet Take 3,000 mcg by mouth daily.     Marland Kitchen diltiazem (CARDIZEM) 30 MG tablet Take 30 mg by mouth 2 (two) times a day.    . terazosin (HYTRIN) 5 MG capsule Take 5 mg by mouth daily.      No facility-administered medications prior to visit.      Allergies:   Patient has no known allergies.   Social History   Socioeconomic History  . Marital status: Married    Spouse name: Not on file  . Number of children: 4  . Years of education: Not on file  . Highest education level: Not on file  Occupational History  . Occupation: retired    Comment: Geographical information systems officer  . Financial resource strain: Not on file  . Food insecurity    Worry: Not on file    Inability: Not on file  . Transportation needs    Medical: Not on file    Non-medical: Not on file  Tobacco Use  . Smoking status: Former Smoker    Packs/day: 2.00    Years: 50.00    Pack years: 100.00    Types: Cigarettes    Quit date: 07/28/2000    Years since quitting: 18.0  . Smokeless tobacco: Former Systems developer    Quit date: 02/25/1999  Substance and Sexual Activity  . Alcohol use: No    Comment: couple glasses wine/week, occ beer or rmixed drink  . Drug  use: No    Frequency: 2.0 times per week  . Sexual activity: Not on file  Lifestyle  . Physical activity    Days per week: Not on file    Minutes per session: Not on file  . Stress: Not on file  Relationships  . Social Herbalist on phone: Not on file    Gets together: Not on file    Attends religious service: Not on file    Active member of club or organization: Not on file    Attends meetings of clubs or organizations: Not on file    Relationship status: Not on file  Other Topics Concern  . Not on file  Social History Narrative   Lives w/ wife           Family History:  The patient's family history includes Leukemia in his brother; Lung cancer in his sister; Pancreatic cancer in his sister; Stroke in his father and mother.   Review of Systems:   Please see the history of present illness.     General:  No chills, fever, night sweats or weight changes. Positive for fatigue.  Cardiovascular:  No chest pain, edema, orthopnea, palpitations, paroxysmal nocturnal dyspnea. Positive for dyspnea on exertion.  Dermatological: No rash, lesions/masses Respiratory: No cough, dyspnea Urologic: No hematuria, dysuria Abdominal:   No nausea, vomiting, diarrhea, bright red blood per rectum, melena, or hematemesis Neurologic:  No visual changes, wkns, changes in mental status. All other systems reviewed and are otherwise negative except as noted above.   Physical Exam:    VS:  BP (!) 106/59 (BP Location: Right Arm)   Pulse 64   Temp 97.6 F (36.4 C)   Ht 5\' 9"  (1.753 m)   Wt 207 lb (93.9 kg)   BMI 30.57 kg/m    General: Well developed, well nourished elderly Caucasian male appearing in no acute distress. Head: Normocephalic, atraumatic, sclera non-icteric, no xanthomas, nares are without discharge.  Neck: No carotid bruits. JVD not elevated.  Lungs: Respirations regular and unlabored, without wheezes or rales.  Heart: Regular rate and rhythm. No S3 or S4.  No murmur, no  rubs, or gallops appreciated. Abdomen: Soft, non-tender, non-distended with normoactive bowel sounds. No hepatomegaly. No rebound/guarding. No obvious abdominal masses. Msk:  Strength and tone appear normal for age. No joint deformities or effusions. Extremities: No clubbing or cyanosis. No lower extremity edema.  Distal pedal pulses are 2+ bilaterally. Neuro: Alert and oriented X 3. Moves all extremities spontaneously. No focal deficits noted. Psych:  Responds to questions appropriately with a normal affect. Skin: No rashes or lesions noted  Wt Readings from Last 3 Encounters:  08/17/18 207 lb (93.9 kg)  07/18/18 198 lb 6.6 oz (90 kg)  07/15/18 198 lb (89.8 kg)     Studies/Labs Reviewed:   EKG:  EKG is not ordered today.   Recent Labs: 01/05/2018: ALT 13 07/02/2018: TSH 2.826 07/03/2018: Magnesium 2.0 07/18/2018: BUN 13; Creatinine, Ser 0.99; Hemoglobin 14.4; Platelets 146; Potassium 3.6; Sodium 142   Lipid Panel No results found for: CHOL, TRIG, HDL, CHOLHDL, VLDL, LDLCALC, LDLDIRECT  Additional studies/ records that were reviewed today include:   Echocardiogram: 12/2017 Study Conclusions  - Left ventricle: The cavity size was normal. Wall thickness was   increased in a pattern of moderate LVH. Systolic function was   vigorous. The estimated ejection fraction was in the range of 65%   to 70%. Wall motion was normal; there were no regional wall   motion abnormalities. The study is not technically sufficient to   allow evaluation of LV diastolic function. - Aortic valve: Mildly calcified annulus. Trileaflet; mildly   thickened leaflets. Valve area (VTI): 3.46 cm^2. Valve area   (Vmax): 3.29 cm^2. - Mitral valve: Mildly calcified annulus. Mildly thickened leaflets  Assessment:    1. Atrial flutter, paroxysmal (HCC)   2. Palpitations   3. Dyspnea on exertion   4. Hyperlipidemia, unspecified hyperlipidemia type      Plan:   In order of problems listed above:  1.  Paroxysmal Atrial Flutter/Fibrillation - diagnosed in 12/2017 and he has experienced recurrent episodes since including two documented events in 06/2018. Evaluated by Dr. Lovena Le who  initially recommended a rate-control strategy. He reports worsening dyspnea on exertion and fatigue over the past few months but thinks this typically occurs when he is in atrial fibrillation. HR has been variable when checked at home, ranging from the 40's to 130's per his report. Will plan for a 2-week cardiac event monitor to assess for any evidence of tachy-brady syndrome and to assess his AF burden during this time frame.  - continue Verapamil at current dosing of 120 mg twice daily along with Eliquis for anticoagulation.   2. Dyspnea on Exertion - reports having symptoms since his initial diagnosis of atrial fibrillation. Denies any associated chest pain. Will plan for a monitor as outlined above.  - most recent imaging was an echo in 12/2017 showed a preserved EF of 65-70% with no regional WMA. If monitor is unrevealing, would consider a Lexiscan Myoview at the time of follow-up to rule out an ischemic etiology. He is also obtaining routine labs by his PCP in the interim and by review of the lab sheet today, this already includes a BMET, TSH, CBC, and Mg level.   3. Hyperlipidemia - followed by PCP. He remains on Simvastatin 40mg  daily. Scheduled for repeat labs tomorrow and will request results once available.   Medication Adjustments/Labs and Tests Ordered: Current medicines are reviewed at length with the patient today.  Concerns regarding medicines are outlined above.  Medication changes, Labs and Tests ordered today are listed in the Patient Instructions below. Patient Instructions  Medication Instructions:  Your physician recommends that you continue on your current medications as directed. Please refer to the Current Medication list given to you today.  If you need a refill on your cardiac medications  before your next appointment, please call your pharmacy.   Lab work: NONE  If you have labs (blood work) drawn today and your tests are completely normal, you will receive your results only by: Marland Kitchen MyChart Message (if you have MyChart) OR . A paper copy in the mail If you have any lab test that is abnormal or we need to change your treatment, we will call you to review the results.  Testing/Procedures: Your physician has recommended that you wear an 2 week event monitor. Event monitors are medical devices that record the heart's electrical activity. Doctors most often [place] these monitors to diagnose arrhythmias. Arrhythmias are problems with the speed or rhythm of the heartbeat. The monitor is a small, portable device. You can wear one while you do your normal daily activities. This is usually used to diagnose what is causing palpitations/syncope (passing out).    Follow-Up: At [facility], you and your health needs are our priority.  As part of our continuing mission to provide you with exceptional heart care, we have created designated Provider Care Teams.  These Care Teams include your primary Cardiologist (physician) and Advanced Practice Providers (APPs -  Physician Assistants and Nurse Practitioners) who all work together to provide you with the care you need, when you need it. You will need a follow up appointment as planned.  Please call our office 2 months in advance to schedule this appointment.  You may see Kate Sable, MD or one of the following Advanced Practice Providers on your designated Care Team:   Bernerd Pho, PA-C [facility]) . Ermalinda Barrios, PA-C ([place])  Any Other Special Instructions Will Be Listed Below (If Applicable). Thank you for choosing [facility]!     Signed, Erma Heritage, PA-C  08/17/2018 7:59  PM    [facility] [address] [place], [place] [address] Phone: [phone] Fax: [phone]

## 2018-08-19 ENCOUNTER — Telehealth: Payer: Self-pay | Admitting: Cardiovascular Disease

## 2018-08-19 NOTE — Telephone Encounter (Signed)
Pt's wife states that his monitor was beeping, she pushed it down and it stopped. I advised her that she can switch out the strips that the monitor is on. I also reminded her of the 24 hr preventice help line. She voiced understanding.

## 2018-08-19 NOTE — Telephone Encounter (Signed)
Patient's wife states that patient's monitor is beeping and wants to know what to do. / tg

## 2018-08-30 IMAGING — CT CT CTA ABD/PEL W/CM AND/OR W/O CM
3 of 9 series · 12 of 46 positions shown, 18 images · IV contrast (isovue)
Comparison: 10/08/2016

CLINICAL DATA: Generalized abdominal pain.  Diarrhea.

EXAM:
CTA ABDOMEN AND PELVIS wITHOUT AND WITH CONTRAST
TECHNIQUE: Multidetector CT imaging of the abdomen and pelvis was performed
using the standard protocol during bolus administration of
intravenous contrast. Multiplanar reconstructed images and MIPs were
obtained and reviewed to evaluate the vascular anatomy.
CONTRAST:  100 cc Isovue 370

[Series 4: mesenteric axial arterial · axial · arterial · 0.78mm/px · z∈[+1025,+1209]mm · 4 of 265 slices shown]
[im 27/265  soft-tissue]
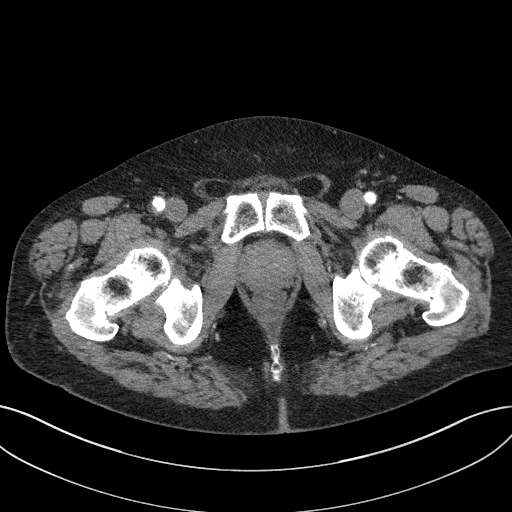
[im 53/265  soft-tissue]
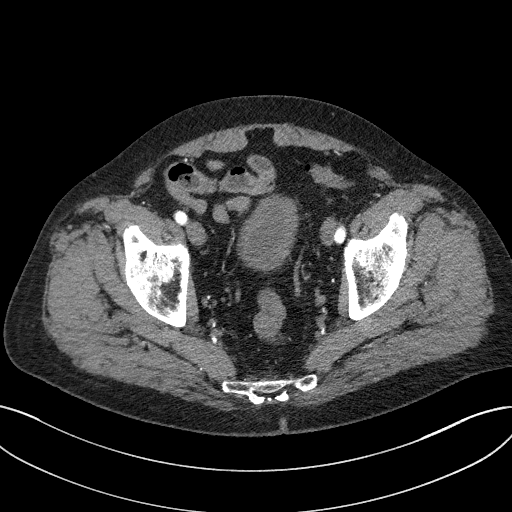
[im 80/265  soft-tissue]
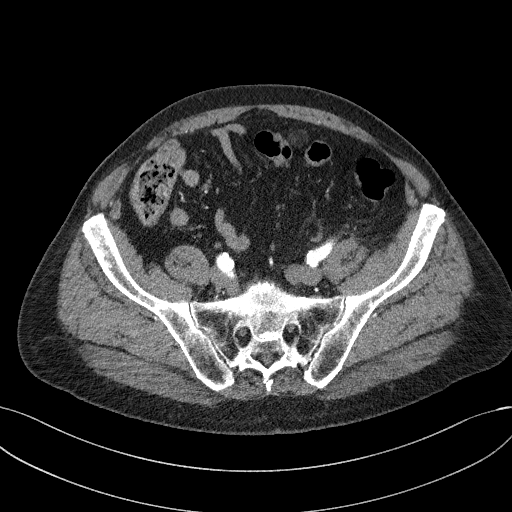
[im 119/265  soft-tissue]
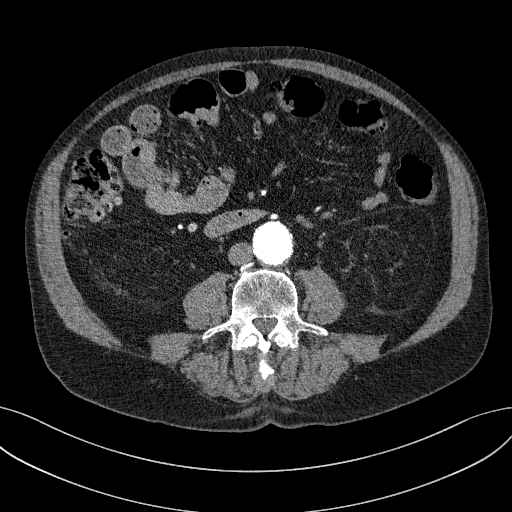

[Series 7: coronals · coronal · 1.13mm/px · 2 of 100 slices shown, 3 images]
[im 34/100  soft-tissue]
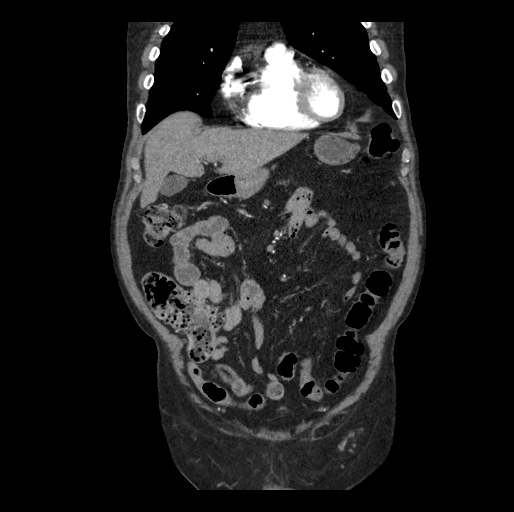
[im 34/100  bone]
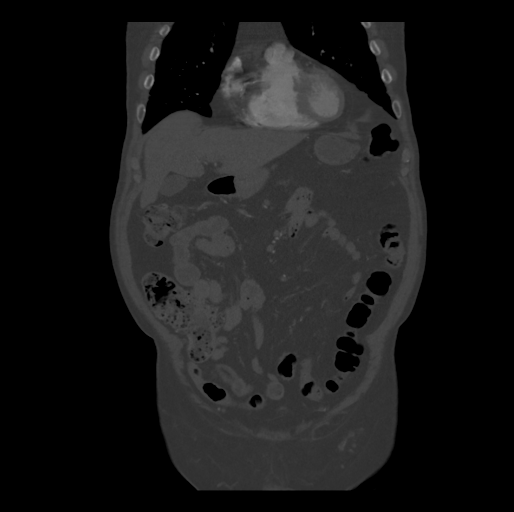
[im 67/100  soft-tissue]
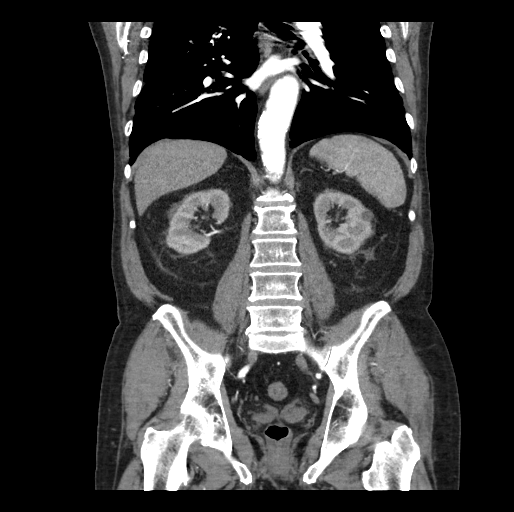

[Series 11: axial venous · axial · portal-venous · 0.83mm/px · z∈[+1046,+1421]mm · 6 of 107 slices shown, 11 images]
[im 16/107  soft-tissue]
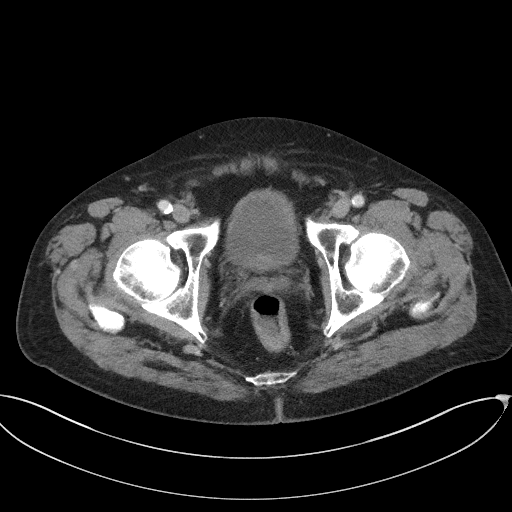
[im 16/107  bone]
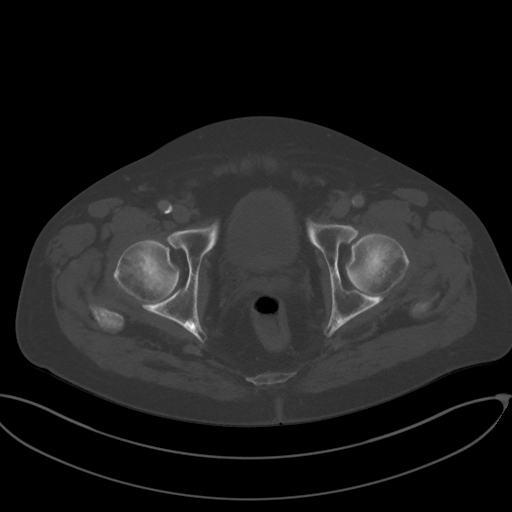
[im 31/107  soft-tissue]
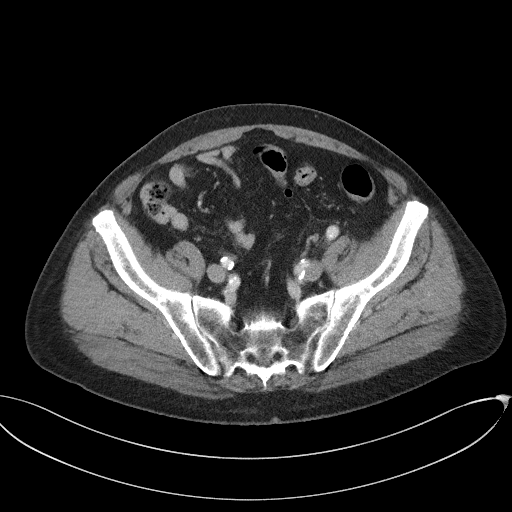
[im 46/107  soft-tissue]
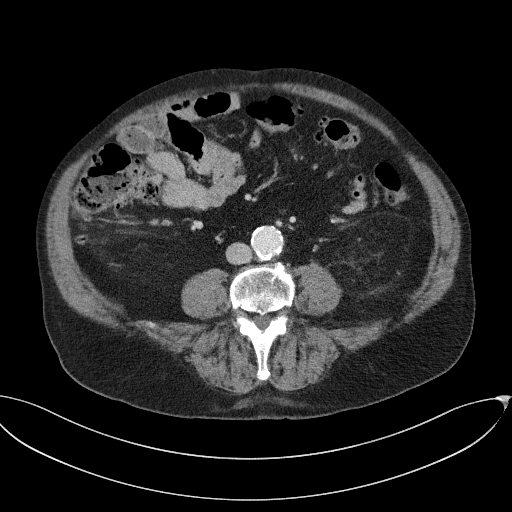
[im 46/107  lung]
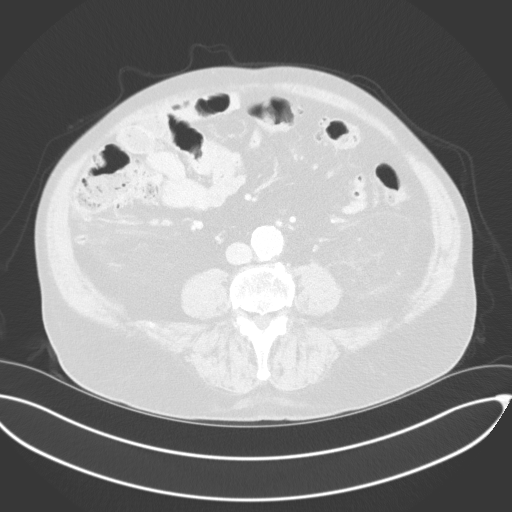
[im 61/107  soft-tissue]
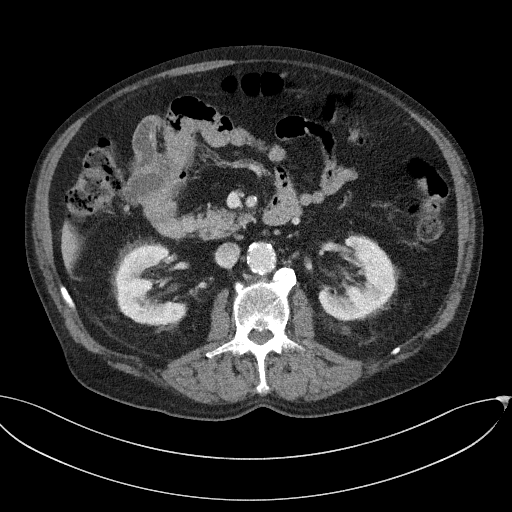
[im 61/107  lung]
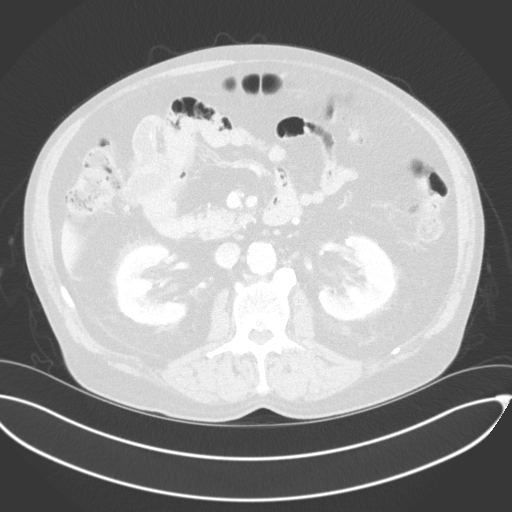
[im 76/107  soft-tissue]
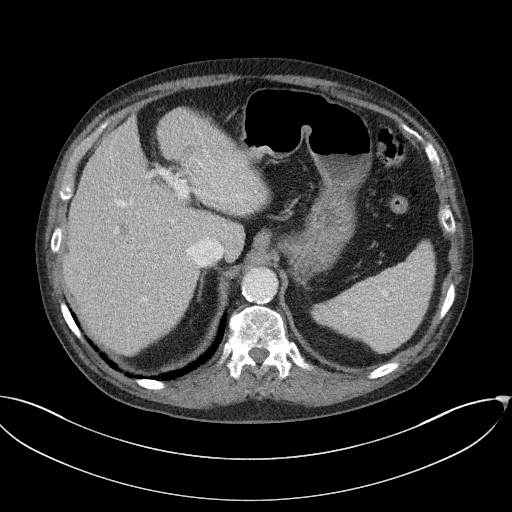
[im 76/107  lung]
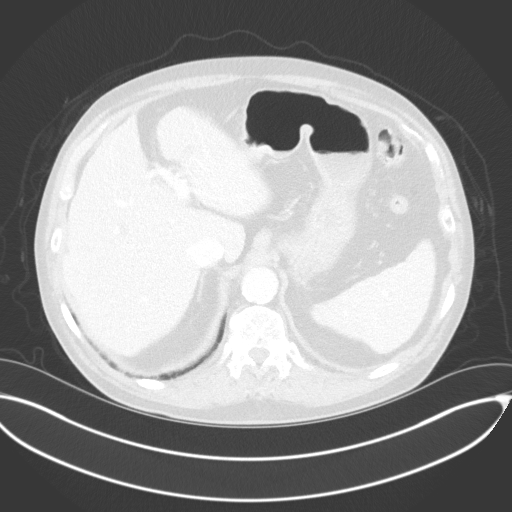
[im 91/107  soft-tissue]
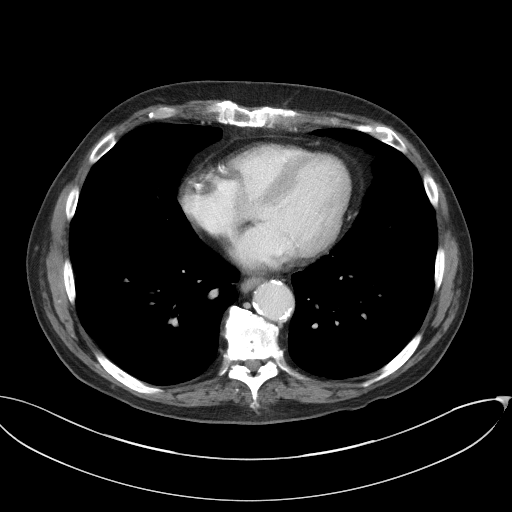
[im 91/107  lung]
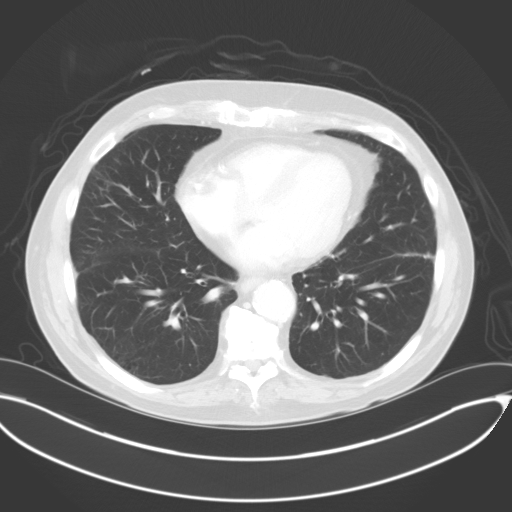

[12 of 46 positions shown; findings below may reference images not displayed]

FINDINGS: VASCULAR

Aorta: Diffuse atherosclerotic calcifications throughout the lower
thoracic and abdominal aorta is present. Maximal AP and transverse
diameters of the abdominal aorta are 3.5 and 3.3 cm. The lumen is
widely patent.

Celiac: Mild narrowing at the origin. Atherosclerotic calcification
is present at the origin.

SMA: Patent.  Replaced right hepatic artery anatomy is noted.

Renals: Single renal arteries are patent bilaterally with
atherosclerotic calcification at the origins.

IMA: Patent. Left colic artery is also widely patent. Sigmoid artery
is patent.

Inflow: Diffuse atherosclerotic calcifications are present in the
right common and external iliac artery's. There is no significant
narrowing. Right internal iliac artery is patent. Similarly, left
common and external iliac arteries are patent with atherosclerotic
calcifications. Left internal iliac artery is patent.

Proximal Outflow: Grossly patent.

Veins: No obvious DVT.

Review of the MIP images confirms the above findings.

NON-VASCULAR

Lower chest: Dependent atelectasis in the posterior mid right lung.

Hepatobiliary: Liver and gallbladder are unremarkable.

Pancreas: Unremarkable

Spleen: Unremarkable

Adrenals/Urinary Tract: Small hypodensities are scattered throughout
both kidneys which are nonspecific. Adrenal glands are unremarkable.
Bladder is within normal limits.

Stomach/Bowel: Stomach and duodenum are within normal limits. There
is no evidence of small-bowel obstruction. Normal appendix.
Diverticulitis is scattered throughout the colon without obvious
focal mass.

There is a focal area of haziness in the fat adjacent to the
proximal descending sigmoid colon on image 184 of series 4. It is
stable compared to the prior study. See image 64 of series 2 on the
prior study. This may represent recurrent or persistent
gastroepiploic appendagitis.

Lymphatic: No abnormal retroperitoneal adenopathy.

Reproductive: Prostate is mildly enlarged.

Other: No free-fluid.

Musculoskeletal: There is no vertebral compression deformity.
IMPRESSION: VASCULAR

Infrarenal abdominal aortic aneurysm measures 3.5 cm in maximal
diameter.

NON-VASCULAR

Possible chronic or recurrent sigmoid colon gastroepiploic
appendagitis. See above comments.

## 2018-09-07 ENCOUNTER — Encounter (INDEPENDENT_AMBULATORY_CARE_PROVIDER_SITE_OTHER): Payer: Self-pay | Admitting: Internal Medicine

## 2018-09-07 ENCOUNTER — Other Ambulatory Visit: Payer: Self-pay

## 2018-09-07 ENCOUNTER — Ambulatory Visit (INDEPENDENT_AMBULATORY_CARE_PROVIDER_SITE_OTHER): Payer: Medicare HMO | Admitting: Internal Medicine

## 2018-09-07 VITALS — BP 116/64 | HR 60 | Temp 98.3°F | Resp 18 | Ht 69.0 in | Wt 207.0 lb

## 2018-09-07 DIAGNOSIS — R197 Diarrhea, unspecified: Secondary | ICD-10-CM

## 2018-09-07 DIAGNOSIS — R143 Flatulence: Secondary | ICD-10-CM | POA: Diagnosis not present

## 2018-09-07 NOTE — Progress Notes (Signed)
Presenting complaint;  Follow-up for diarrhea and excessive flatulence.  Database and subjective:  Patient is 85 year old Caucasian male who has diarrhea of over a year who has undergone extensive work-up and has not responded very therapy.  He now returns for scheduled visit accompanied by his wife.  While he was in [place] his diarrhea worsened and he was treated with 4 weeks of cephalexin and metronidazole and noted decrease in diarrhea and flatulence but did not have normal bowel movements. He recently took Keflex for 10 days for skin infection but it did not help diarrhea or or flatulence. He has kept stool diary for the last 5 days.  He is having anywhere from 2-4 stools per day.  Most of his stools are mushy and he passes large amount of gas per rectum.  His wife states he is still having accidents.  She believes he has had 6-8 accidents in the last 4 months.  His appetite is good.  He has gained 7 pounds since his last visit.  She feels while he was on antibiotics he was having less explosive movements and accidents. He denies nausea vomiting fever chills melena or rectal bleeding.   Current Medications: Outpatient Encounter Medications as of 09/07/2018  Medication Sig  . allopurinol (ZYLOPRIM) 300 MG tablet Take 300 mg by mouth daily.  Marland Kitchen apixaban (ELIQUIS) 5 MG TABS tablet Take 1 tablet (5 mg total) by mouth 2 (two) times daily.  . Cholecalciferol (VITAMIN D3) 2000 units TABS Take 2 capsules by mouth daily.   . fish oil-omega-3 fatty acids 1000 MG capsule Take 1 g by mouth daily.    Marland Kitchen ipratropium (ATROVENT) 0.06 % nasal spray Place 2 sprays into both nostrils daily as needed for rhinitis.   Marland Kitchen LACTOBACILLUS PO Take by mouth daily.  Vladimir Faster Glycol-Propyl Glycol (SYSTANE OP) Apply 1 drop to eye 4 (four) times daily as needed (dry eyes).  . simvastatin (ZOCOR) 40 MG tablet Take 40 mg by mouth every evening.   . tamsulosin (FLOMAX) 0.4 MG CAPS capsule Take 0.4 mg by mouth.  . verapamil  (CALAN-SR) 120 MG CR tablet Take 1 tablet (120 mg total) by mouth 2 (two) times daily.  . vitamin B-12 (CYANOCOBALAMIN) 1000 MCG tablet Take 3,000 mcg by mouth daily.    No facility-administered encounter medications on file as of 09/07/2018.      Objective: Blood pressure 116/64, pulse 60, temperature 98.3 F (36.8 C), temperature source Oral, resp. rate 18, height 5\' 9"  (1.753 m), weight 207 lb (93.9 kg). Patient is alert and in no acute distress. He has hearing impairment. Conjunctiva is pink. Sclera is nonicteric Oropharyngeal mucosa is normal. No neck masses or thyromegaly noted. Cardiac exam with regular rhythm normal S1 and S2.  He has grade 2/6 systolic ejection murmur best heard at aortic area. Lungs are clear to auscultation. Abdomen is full.  Bowel sounds are normal.  On palpation abdomen is soft and nontender with no organomegaly or masses.  Percussion note appears to be normal. No LE edema or clubbing noted.   Assessment:  #1.  Chronic diarrhea unresponsive to most therapies.  I feel he has small intestinal bacterial overgrowth and he has not responded well to antibiotic therapy.  He may also have hypermotility.  Will try try the following.  Plan:  Will request copy of recent blood work from Dr. Denita Lung office. Patient advised to decrease coffee intake to no more than 1 cup/day for the next 1 week and see if stool frequency  improves.  If stool frequency does not improve he will try Pepto-Bismol 1-2 is tablespoonful 3 times a day.  Patient informed that stool will turn black and he should not be alarmed.  He was also advised to take Pepto-Bismol 2 hours before or after taking other medications. He will call with progress report in few weeks. Office visit in 3 months.

## 2018-09-07 NOTE — Patient Instructions (Signed)
Limit coffee intake to 1 cup/day for the next 1 week. Keep stool diary as before. If diarrhea does not improve begin Pepto-Bismol 1-2 tablespoonful 3 times a day.  Remember it will make his stools black.  Do not take Pepto-Bismol when you take your other medications may want to wait for 2 hours. Will request copy of recent blood work from Dr. Denita Lung office.

## 2018-09-09 ENCOUNTER — Other Ambulatory Visit: Payer: Self-pay

## 2018-09-15 ENCOUNTER — Encounter: Payer: Self-pay | Admitting: *Deleted

## 2018-09-15 ENCOUNTER — Ambulatory Visit: Payer: Medicare HMO | Admitting: Internal Medicine

## 2018-09-15 ENCOUNTER — Other Ambulatory Visit: Payer: Self-pay

## 2018-09-15 ENCOUNTER — Encounter: Payer: Self-pay | Admitting: Internal Medicine

## 2018-09-15 DIAGNOSIS — R0602 Shortness of breath: Secondary | ICD-10-CM

## 2018-09-15 NOTE — Progress Notes (Signed)
HPI [name] returns today for followup. He is a pleasant 85 yo man with a h/o peristent atrial fib who I saw 2 months ago for atrial fib. We discussed rate vs rhythm control and he elected to try rhythm control. He was placed on verapamil because he also has a h/o diarrhea. In the interim, he is still symptomatic and his diarrhea has not improved. He denies medical non-compliance.  No Known Allergies   Current Outpatient Medications  Medication Sig Dispense Refill  . allopurinol (ZYLOPRIM) 300 MG tablet Take 300 mg by mouth daily.    Marland Kitchen apixaban (ELIQUIS) 5 MG TABS tablet Take 1 tablet (5 mg total) by mouth 2 (two) times daily. 60 tablet 6  . Cholecalciferol (VITAMIN D3) 2000 units TABS Take 2 capsules by mouth daily.     . fish oil-omega-3 fatty acids 1000 MG capsule Take 1 g by mouth daily.      Marland Kitchen ipratropium (ATROVENT) 0.06 % nasal spray Place 2 sprays into both nostrils daily as needed for rhinitis.     Marland Kitchen LACTOBACILLUS PO Take by mouth daily.    Juan Quinn Glycol-Propyl Glycol (SYSTANE OP) Apply 1 drop to eye 4 (four) times daily as needed (dry eyes).    . simvastatin (ZOCOR) 40 MG tablet Take 40 mg by mouth every evening.     . tamsulosin (FLOMAX) 0.4 MG CAPS capsule Take 0.4 mg by mouth.    . verapamil (CALAN-SR) 120 MG CR tablet Take 1 tablet (120 mg total) by mouth 2 (two) times daily. 180 tablet 3  . vitamin B-12 (CYANOCOBALAMIN) 1000 MCG tablet Take 3,000 mcg by mouth daily.      No current facility-administered medications for this visit.      Past Medical History:  Diagnosis Date  . Adenomatous colon polyp 2000   Surveillance due 2017  . Atrial flutter ([place])    a. diagnosed in 12/2017  . Chronic diarrhea   . Helicobacter pylori antibody positive 2015   treatment with Prevpac  . HTN (hypertension)   . Hypercholesteremia   . Squamous cell carcinoma     ROS:   All systems reviewed and negative except as noted in the HPI.   Past Surgical History:   Procedure Laterality Date  . BIOPSY  10/24/2016   Procedure: BIOPSY;  Surgeon: Daneil Dolin, MD;  [facility];  Service: Endoscopy;;  colon  . BIOPSY  01/06/2018   Procedure: BIOPSY;  Surgeon: Rogene Houston, MD;  [facility];  Service: Endoscopy;;  duodenal  . COLONOSCOPY  June 2012   Dr. Gala Romney: adenomatous polyps, due for surveillance if health permits in [id number]  . COLONOSCOPY N/A 10/24/2016   Procedure: COLONOSCOPY;  Surgeon: Daneil Dolin, MD;  [facility];  Service: Endoscopy;  Laterality: N/A;  2:00pm  . ESOPHAGOGASTRODUODENOSCOPY N/A 05/24/2013   Dr. Rourk:Prepyloric gastric ulcerations-likely source of hematemesis-likely NSAID-related. H.PYLORI SEROLOGY POSITIVE, TREATED WITH PREVPAC  . ESOPHAGOGASTRODUODENOSCOPY N/A 05/23/2013   [id number] EGD secondary to much clot and food debris in the stomach  . ESOPHAGOGASTRODUODENOSCOPY N/A 08/16/2013   Dr. Gala Romney: normal esophagus, stomach empty, deformity of antrum. scar present. Previously noted ulcers completed healed. Patent pylorus, normal D1 and D2  . ESOPHAGOGASTRODUODENOSCOPY N/A 01/06/2018   Procedure: ESOPHAGOGASTRODUODENOSCOPY (EGD);  Surgeon: Rogene Houston, MD;  [facility];  Service: Endoscopy;  Laterality: N/A;  . HEMORRHOID SURGERY    . POLYPECTOMY  10/24/2016   Procedure: POLYPECTOMY;  Surgeon: Daneil Dolin, MD;  [facility];  Service: Endoscopy;;  colon  . shoulder       Family History  Problem Relation Age of Onset  . Stroke Mother   . Stroke Father   . Pancreatic cancer Sister   . Leukemia Brother   . Lung cancer Sister   . Colon cancer Neg Hx      Social History   Socioeconomic History  . Marital status: Married    Spouse name: Not on file  . Number of children: 4  . Years of education: Not on file  . Highest education level: Not on file  Occupational History  . Occupation: retired    Comment: Geographical information systems officer  . Financial  resource strain: Not on file  . Food insecurity    Worry: Not on file    Inability: Not on file  . Transportation needs    Medical: Not on file    Non-medical: Not on file  Tobacco Use  . Smoking status: Former Smoker    Packs/day: 2.00    Years: 50.00    Pack years: 100.00    Types: Cigarettes    Quit date: 07/28/2000    Years since quitting: 18.1  . Smokeless tobacco: Former Systems developer    Quit date: 02/25/1999  Substance and Sexual Activity  . Alcohol use: No    Comment: couple glasses wine/week, occ beer or rmixed drink  . Drug use: No    Frequency: 2.0 times per week  . Sexual activity: Not on file  Lifestyle  . Physical activity    Days per week: Not on file    Minutes per session: Not on file  . Stress: Not on file  Relationships  . Social Herbalist on phone: Not on file    Gets together: Not on file    Attends religious service: Not on file    Active member of club or organization: Not on file    Attends meetings of clubs or organizations: Not on file    Relationship status: Not on file  . Intimate partner violence    Fear of current or ex partner: Not on file    Emotionally abused: Not on file    Physically abused: Not on file    Forced sexual activity: Not on file  Other Topics Concern  . Not on file  Social History Narrative   Lives w/ wife           BP (!) 141/66 (BP Location: Right Arm)   Pulse 84   Ht 5\' 9"  (1.753 m)   Wt 209 lb (94.8 kg)   SpO2 93%   BMI 30.86 kg/m   Physical Exam:  Well appearing NAD HEENT: Unremarkable Neck:  No JVD, no thyromegally Lymphatics:  No adenopathy Back:  No CVA tenderness Lungs:  Clear with no wheezes HEART:  Regular rate rhythm, no murmurs, no rubs, no clicks Abd:  soft, positive bowel sounds, no organomegally, no rebound, no guarding Ext:  2 plus pulses, no edema, no cyanosis, no clubbing Skin:  No rashes no nodules Neuro:  CN II through XII intact, motor grossly intact  Assess/Plan: 1. Atrial fib  - we discussed continued rate control vs rhythm control. We discussed amiodarone, sotalol, and dofetilide. For now he prefers to continue his verapamil.  2. Diarrhea - this did not improved. He notes 3-4 loose BM's a day. He has reduced his caffeine intake and this appears  to have helped some.  3. Dyspnea - this is multifactorial. He is encouraged to reduce his salt intake including no ham biscuits.   Mikle Bosworth.D.

## 2018-09-15 NOTE — Patient Instructions (Signed)
Medication Instructions:  Your physician recommends that you continue on your current medications as directed. Please refer to the Current Medication list given to you today.  If you need a refill on your cardiac medications before your next appointment, please call your pharmacy.   Lab work: NONE  If you have labs (blood work) drawn today and your tests are completely normal, you will receive your results only by: Marland Kitchen MyChart Message (if you have MyChart) OR . A paper copy in the mail If you have any lab test that is abnormal or we need to change your treatment, we will call you to review the results.  Testing/Procedures: Your physician has requested that you have en exercise stress myoview. For further information please visit [email or web address]. Please follow instruction sheet, as given.    Follow-Up: At [facility], you and your health needs are our priority.  As part of our continuing mission to provide you with exceptional heart care, we have created designated Provider Care Teams.  These Care Teams include your primary Cardiologist (physician) and Advanced Practice Providers (APPs -  Physician Assistants and Nurse Practitioners) who all work together to provide you with the care you need, when you need it. You will need a follow up appointment in after testing   .  Please call our office 2 months in advance to schedule this appointment.  You may see None or one of the following Advanced Practice Providers on your designated Care Team:   Chanetta Marshall, NP . Tommye Standard, PA-C  Any Other Special Instructions Will Be Listed Below (If Applicable). Thank you for choosing [facility]!

## 2018-09-20 ENCOUNTER — Other Ambulatory Visit (HOSPITAL_COMMUNITY)
Admission: RE | Admit: 2018-09-20 | Discharge: 2018-09-20 | Disposition: A | Discharge status: Home / Self Care | Payer: Medicare HMO | Source: Ambulatory Visit | Attending: Internal Medicine | Admitting: Internal Medicine

## 2018-09-20 DIAGNOSIS — Z20828 Contact with and (suspected) exposure to other viral communicable diseases: Secondary | ICD-10-CM | POA: Insufficient documentation

## 2018-09-21 LAB — SARS CORONAVIRUS 2 (TAT 6-24 HRS): SARS Coronavirus 2: NEGATIVE

## 2018-09-24 ENCOUNTER — Encounter (HOSPITAL_COMMUNITY)
Admission: RE | Admit: 2018-09-24 | Discharge: 2018-09-24 | Disposition: A | Discharge status: Home / Self Care | Payer: Medicare HMO | Source: Ambulatory Visit | Attending: Internal Medicine | Admitting: Internal Medicine

## 2018-09-24 ENCOUNTER — Other Ambulatory Visit: Payer: Self-pay

## 2018-09-24 ENCOUNTER — Encounter (HOSPITAL_BASED_OUTPATIENT_CLINIC_OR_DEPARTMENT_OTHER)
Admission: RE | Admit: 2018-09-24 | Discharge: 2018-09-24 | Disposition: A | Discharge status: Home / Self Care | Payer: Medicare HMO | Source: Ambulatory Visit | Attending: Internal Medicine | Admitting: Internal Medicine

## 2018-09-24 ENCOUNTER — Encounter (HOSPITAL_COMMUNITY): Payer: Self-pay

## 2018-09-24 DIAGNOSIS — R0602 Shortness of breath: Secondary | ICD-10-CM

## 2018-09-24 LAB — NM MYOCAR MULTI W/SPECT W/WALL MOTION / EF
LV dias vol: 101 mL (ref 62–150)
LV sys vol: 36 mL (ref 21–61)
Peak HR: 80 {beats}/min
RATE: 0.36
Rest HR: 54 {beats}/min
SDS: 0
SRS: 6
SSS: 6
TID: 1.2

## 2018-09-24 MED ORDER — TECHNETIUM TC 99M TETROFOSMIN IV KIT
30.0000 | PACK | Freq: Once | INTRAVENOUS | Status: AC | PRN
  Administered 2018-09-24: 32 via INTRAVENOUS

## 2018-09-24 MED ORDER — SODIUM CHLORIDE 0.9% FLUSH
INTRAVENOUS | Status: AC
  Administered 2018-09-24: 10 mL via INTRAVENOUS
  Filled 2018-09-24: qty 10

## 2018-09-24 MED ORDER — TECHNETIUM TC 99M TETROFOSMIN IV KIT
10.0000 | PACK | Freq: Once | INTRAVENOUS | Status: AC | PRN
  Administered 2018-09-24: 11 via INTRAVENOUS

## 2018-09-24 MED ORDER — REGADENOSON 0.4 MG/5ML IV SOLN
INTRAVENOUS | Status: AC
  Administered 2018-09-24: 0.4 mg via INTRAVENOUS
  Filled 2018-09-24: qty 5

## 2018-09-28 ENCOUNTER — Other Ambulatory Visit: Payer: Self-pay | Admitting: Physician Assistant

## 2018-09-30 ENCOUNTER — Telehealth: Payer: Self-pay

## 2018-09-30 NOTE — Telephone Encounter (Signed)
Called pt, spoke with his wife concerning results. She voiced understanding. She stated that the stress test was suppose to determine if he was started on a medication for his heart. Please advise.

## 2018-10-05 ENCOUNTER — Telehealth: Payer: Self-pay | Admitting: Internal Medicine

## 2018-10-05 NOTE — Telephone Encounter (Signed)
Please give pt's wife a call concerning medications

## 2018-10-06 NOTE — Telephone Encounter (Signed)
Returned pt's wife's call. She would like to discuss medication changes with Dr. Lovena Le. Appointment made. (per LOV, he was to follow up after testing.)

## 2018-10-12 ENCOUNTER — Other Ambulatory Visit: Payer: Self-pay

## 2018-10-12 MED ORDER — APIXABAN 5 MG PO TABS: 5.0000 mg | ORAL_TABLET | Freq: Two times a day (BID) | ORAL | 6 refills | Status: DC

## 2018-10-19 ENCOUNTER — Encounter: Payer: Self-pay | Admitting: Internal Medicine

## 2018-10-19 ENCOUNTER — Other Ambulatory Visit: Payer: Self-pay

## 2018-10-19 ENCOUNTER — Ambulatory Visit: Payer: Medicare HMO | Admitting: Internal Medicine

## 2018-10-19 VITALS — BP 125/63 | HR 56 | Temp 97.5°F | Wt 209.0 lb

## 2018-10-19 DIAGNOSIS — E782 Mixed hyperlipidemia: Secondary | ICD-10-CM | POA: Diagnosis not present

## 2018-10-19 DIAGNOSIS — R002 Palpitations: Secondary | ICD-10-CM

## 2018-10-19 MED ORDER — SIMVASTATIN 20 MG PO TABS: 20.0000 mg | ORAL_TABLET | Freq: Every day | ORAL | 3 refills | Status: DC

## 2018-10-19 NOTE — Patient Instructions (Signed)
Medication Instructions:  Your physician has recommended you make the following change in your medication:  Decrease Simvastatin to 20 mg Daily  Continue Taking Verapamil   If you need a refill on your cardiac medications before your next appointment, please call your pharmacy.   Lab work: Your physician recommends that you return for lab work in: 6 Weeks  (11/30/18)   If you have labs (blood work) drawn today and your tests are completely normal, you will receive your results only by: Marland Kitchen MyChart Message (if you have MyChart) OR . A paper copy in the mail If you have any lab test that is abnormal or we need to change your treatment, we will call you to review the results.  Testing/Procedures: NONE  Follow-Up: At [facility], you and your health needs are our priority.  As part of our continuing mission to provide you with exceptional heart care, we have created designated Provider Care Teams.  These Care Teams include your primary Cardiologist (physician) and Advanced Practice Providers (APPs -  Physician Assistants and Nurse Practitioners) who all work together to provide you with the care you need, when you need it. You will need a follow up appointment in 6 months.  Please call our office 2 months in advance to schedule this appointment.  You may see Dr. Lovena Le or one of the following Advanced Practice Providers on your designated Care Team:   Chanetta Marshall, NP . Tommye Standard, PA-C  Any Other Special Instructions Will Be Listed Below (If Applicable). Thank you for choosing [facility]!

## 2018-10-19 NOTE — Progress Notes (Signed)
HPI [name] returns today for followup. He is a pleasant 85 yo man with a h/o peristent atrial fib who I saw a month ago for atrial fib. We discussed rate vs rhythm control and he elected to try rhythm control. He was placed on verapamil because he also has a h/o diarrhea. In the interim, he is better. His HR is not as fast. He still has some dyspnea with exertion. He is concerned about his simvastatin. He does not have any muscle aches.  No Known Allergies   Current Outpatient Medications  Medication Sig Dispense Refill  . allopurinol (ZYLOPRIM) 300 MG tablet Take 300 mg by mouth daily.    Marland Kitchen apixaban (ELIQUIS) 5 MG TABS tablet Take 1 tablet (5 mg total) by mouth 2 (two) times daily. 60 tablet 6  . Cholecalciferol (VITAMIN D3) 2000 units TABS Take 2 capsules by mouth daily.     . fish oil-omega-3 fatty acids 1000 MG capsule Take 1 g by mouth daily.      Marland Kitchen ipratropium (ATROVENT) 0.06 % nasal spray Place 2 sprays into both nostrils daily as needed for rhinitis.     Marland Kitchen LACTOBACILLUS PO Take by mouth daily.    Vladimir Faster Glycol-Propyl Glycol (SYSTANE OP) Apply 1 drop to eye 4 (four) times daily as needed (dry eyes).    . simvastatin (ZOCOR) 40 MG tablet Take 40 mg by mouth every evening.     . tamsulosin (FLOMAX) 0.4 MG CAPS capsule Take 0.4 mg by mouth.    . verapamil (CALAN-SR) 120 MG CR tablet Take 1 tablet (120 mg total) by mouth 2 (two) times daily. 180 tablet 3  . vitamin B-12 (CYANOCOBALAMIN) 1000 MCG tablet Take 3,000 mcg by mouth daily.      No current facility-administered medications for this visit.      Past Medical History:  Diagnosis Date  . Adenomatous colon polyp 2000   Surveillance due 2017  . Atrial flutter ([place])    a. diagnosed in 12/2017  . Chronic diarrhea   . Helicobacter pylori antibody positive 2015   treatment with Prevpac  . HTN (hypertension)   . Hypercholesteremia   . Squamous cell carcinoma     ROS:   All systems reviewed and negative except  as noted in the HPI.   Past Surgical History:  Procedure Laterality Date  . BIOPSY  10/24/2016   Procedure: BIOPSY;  Surgeon: Daneil Dolin, MD;  [facility];  Service: Endoscopy;;  colon  . BIOPSY  01/06/2018   Procedure: BIOPSY;  Surgeon: Rogene Houston, MD;  [facility];  Service: Endoscopy;;  duodenal  . COLONOSCOPY  June 2012   Dr. Gala Romney: adenomatous polyps, due for surveillance if health permits in [id number]  . COLONOSCOPY N/A 10/24/2016   Procedure: COLONOSCOPY;  Surgeon: Daneil Dolin, MD;  [facility];  Service: Endoscopy;  Laterality: N/A;  2:00pm  . ESOPHAGOGASTRODUODENOSCOPY N/A 05/24/2013   Dr. Rourk:Prepyloric gastric ulcerations-likely source of hematemesis-likely NSAID-related. H.PYLORI SEROLOGY POSITIVE, TREATED WITH PREVPAC  . ESOPHAGOGASTRODUODENOSCOPY N/A 05/23/2013   [id number] EGD secondary to much clot and food debris in the stomach  . ESOPHAGOGASTRODUODENOSCOPY N/A 08/16/2013   Dr. Gala Romney: normal esophagus, stomach empty, deformity of antrum. scar present. Previously noted ulcers completed healed. Patent pylorus, normal D1 and D2  . ESOPHAGOGASTRODUODENOSCOPY N/A 01/06/2018   Procedure: ESOPHAGOGASTRODUODENOSCOPY (EGD);  Surgeon: Rogene Houston, MD;  [facility];  Service: Endoscopy;  Laterality: N/A;  .  HEMORRHOID SURGERY    . POLYPECTOMY  10/24/2016   Procedure: POLYPECTOMY;  Surgeon: Daneil Dolin, MD;  [facility];  Service: Endoscopy;;  colon  . shoulder       Family History  Problem Relation Age of Onset  . Stroke Mother   . Stroke Father   . Pancreatic cancer Sister   . Leukemia Brother   . Lung cancer Sister   . Colon cancer Neg Hx      Social History   Socioeconomic History  . Marital status: Married    Spouse name: Not on file  . Number of children: 4  . Years of education: Not on file  . Highest education level: Not on file  Occupational History  . Occupation: retired     Comment: Geographical information systems officer  . Financial resource strain: Not on file  . Food insecurity    Worry: Not on file    Inability: Not on file  . Transportation needs    Medical: Not on file    Non-medical: Not on file  Tobacco Use  . Smoking status: Former Smoker    Packs/day: 2.00    Years: 50.00    Pack years: 100.00    Types: Cigarettes    Quit date: 07/28/2000    Years since quitting: 18.2  . Smokeless tobacco: Former Systems developer    Quit date: 02/25/1999  Substance and Sexual Activity  . Alcohol use: No    Comment: couple glasses wine/week, occ beer or rmixed drink  . Drug use: No    Frequency: 2.0 times per week  . Sexual activity: Not on file  Lifestyle  . Physical activity    Days per week: Not on file    Minutes per session: Not on file  . Stress: Not on file  Relationships  . Social Herbalist on phone: Not on file    Gets together: Not on file    Attends religious service: Not on file    Active member of club or organization: Not on file    Attends meetings of clubs or organizations: Not on file    Relationship status: Not on file  . Intimate partner violence    Fear of current or ex partner: Not on file    Emotionally abused: Not on file    Physically abused: Not on file    Forced sexual activity: Not on file  Other Topics Concern  . Not on file  Social History Narrative   Lives w/ wife           BP 125/63   Pulse (!) 56   Temp (!) 97.5 F (36.4 C)   Wt 209 lb (94.8 kg)   SpO2 (!) 56%   BMI 30.86 kg/m   Physical Exam:  Well appearing elderly man, NAD HEENT: Unremarkable Neck:  6 cm JVD, no thyromegally Lymphatics:  No adenopathy Back:  No CVA tenderness Lungs:  Clear with no wheezes HEART:  IRegular rate rhythm, no murmurs, no rubs, no clicks Abd:  soft, positive bowel sounds, no organomegally, no rebound, no guarding Ext:  2 plus pulses, trace edema, no cyanosis, no clubbing Skin:  No rashes no nodules Neuro:  CN II through XII intact,  motor grossly intact  Assess/Plan: 1. Atrial fib - his rates are reasonably well controlled. I would recommend he continue verapamil. 2. HTN - his bp is controlled. Continue. 3. Dyslipidemia - we spent much of our visit discussing the  treatment of dyslipidemia and simvastatin in particular. He has no muscle aches. I asked him to reduce his dose of simvastatin to 20 mg daily. I actually recommended crestor in low dose but he would like to continue simvastatin. We will check fasting lipids and liver panel and TSH in 6 weeks. 4. Diarrhea - on verapamil his diarrhea is better.   Mikle Bosworth.D.

## 2018-10-21 LAB — LIPID PANEL
Cholesterol: 192 mg/dL (ref <200)
HDL: 55 mg/dL (ref >=40)
LDL Cholesterol (Calc): 111 mg/dL (calc) — ABNORMAL HIGH
Non-HDL Cholesterol (Calc): 137 mg/dL (calc) — ABNORMAL HIGH (ref <130)
Total CHOL/HDL Ratio: 3.5 (calc) (ref <5.0)
Triglycerides: 148 mg/dL (ref <150)

## 2018-10-21 LAB — HEPATIC FUNCTION PANEL
AG Ratio: 1.9 (calc) (ref 1.0–2.5)
ALT: 10 U/L (ref 9–46)
AST: 14 U/L (ref 10–35)
Albumin: 4 g/dL (ref 3.6–5.1)
Alkaline phosphatase (APISO): 57 U/L (ref 35–144)
Bilirubin, Direct: 0.1 mg/dL (ref 0.0–0.2)
Globulin: 2.1 g/dL (calc) (ref 1.9–3.7)
Indirect Bilirubin: 0.5 mg/dL (calc) (ref 0.2–1.2)
Total Bilirubin: 0.6 mg/dL (ref 0.2–1.2)
Total Protein: 6.1 g/dL (ref 6.1–8.1)

## 2018-10-21 LAB — TSH: TSH: 1.97 mIU/L (ref 0.40–4.50)

## 2018-12-21 ENCOUNTER — Ambulatory Visit (INDEPENDENT_AMBULATORY_CARE_PROVIDER_SITE_OTHER): Payer: Medicare HMO | Admitting: Internal Medicine

## 2018-12-21 ENCOUNTER — Other Ambulatory Visit: Payer: Self-pay

## 2018-12-21 ENCOUNTER — Encounter (INDEPENDENT_AMBULATORY_CARE_PROVIDER_SITE_OTHER): Payer: Self-pay | Admitting: Internal Medicine

## 2018-12-21 VITALS — BP 112/68 | HR 78 | Temp 98.0°F | Ht 69.0 in | Wt 209.9 lb

## 2018-12-21 DIAGNOSIS — R197 Diarrhea, unspecified: Secondary | ICD-10-CM

## 2018-12-21 NOTE — Patient Instructions (Signed)
Notify if diarrhea relapses

## 2018-12-21 NOTE — Progress Notes (Signed)
Presenting complaint;  Follow-up for chronic diarrhea.  Database and subjective:  Patient is 85 year old Caucasian male with chronic diarrhea who has undergone extensive work-up locally as well as at [facility] but never responded to any therapy including antibiotics for small intestinal bacterial overgrowth. He was last seen on 09/07/2018.  I recommended trying Pepto-Bismol.  He decided not to take it. He states he has been having formed stools for the last 6 weeks.  He has a bowel movement daily or every other day.  His wife showed me a picture that she captured on her cell phone revealing a normal stool.  Patient states he was begun on verapamil on 09/15/2018.  He also has changed his diet quite a bit.  He has quit eating broccoli cauliflower collard greens and beans.  He has noted much less flatulence.  He did have one episode where he had an accident while in bed. He feels much better.  His weight is up by 2 pounds since his last visit.  Current Medications: Outpatient Encounter Medications as of 12/21/2018  Medication Sig  . allopurinol (ZYLOPRIM) 300 MG tablet Take 300 mg by mouth daily.  Marland Kitchen apixaban (ELIQUIS) 5 MG TABS tablet Take 1 tablet (5 mg total) by mouth 2 (two) times daily.  . Cholecalciferol (VITAMIN D3) 2000 units TABS Take 3 capsules by mouth daily.   . fish oil-omega-3 fatty acids 1000 MG capsule Take 1 g by mouth daily.    Marland Kitchen ipratropium (ATROVENT) 0.06 % nasal spray Place 2 sprays into both nostrils daily as needed for rhinitis.   Marland Kitchen LACTOBACILLUS PO Take by mouth daily.  Vladimir Faster Glycol-Propyl Glycol (SYSTANE OP) Apply 1 drop to eye 4 (four) times daily as needed (dry eyes).  . simvastatin (ZOCOR) 20 MG tablet Take 1 tablet (20 mg total) by mouth at bedtime.  . tamsulosin (FLOMAX) 0.4 MG CAPS capsule Take 0.4 mg by mouth.  . verapamil (CALAN-SR) 120 MG CR tablet Take 1 tablet (120 mg total) by mouth 2 (two) times daily.  . vitamin B-12 (CYANOCOBALAMIN) 1000 MCG tablet Take  3,000 mcg by mouth daily.    No facility-administered encounter medications on file as of 12/21/2018.     Objective: Blood pressure 112/68, pulse 78, temperature 98 F (36.7 C), temperature source Oral, height _0  (1.753 m), weight 209 lb 14.4 oz (95.2 kg). Patient is alert and in no acute distress. He is wearing facial mask. Conjunctiva is pink. Sclera is nonicteric Oropharyngeal mucosa is normal. No neck masses or thyromegaly noted. Cardiac exam with regular rhythm normal S1 and S2. No murmur or gallop noted. Lungs are clear to auscultation. Abdomen is full.  Bowel sounds are normal.  On palpation is soft and nontender with organomegaly or masses. No LE edema or clubbing noted.  Labs/studies Results:  CBC Latest Ref Rng & Units 07/18/2018 07/02/2018 01/07/2018  WBC 4.0 - 10.5 K/uL 8.7 6.7 5.9  Hemoglobin 13.0 - 17.0 g/dL 14.4 14.3 12.2(L)  Hematocrit 39.0 - 52.0 % 41.9 40.9 36.9(L)  Platelets 150 - 400 K/uL 146(L) 142(L) 117(L)    CMP Latest Ref Rng & Units 10/21/2018 07/18/2018 07/03/2018  Glucose 70 - 99 mg/dL - 126(H) 98  BUN 8 - 23 mg/dL - 13 15  Creatinine 0.61 - 1.24 mg/dL - 0.99 0.87  Sodium 135 - 145 mmol/L - 142 141  Potassium 3.5 - 5.1 mmol/L - 3.6 3.7  Chloride 98 - 111 mmol/L - 107 109  CO2 22 - 32 mmol/L - 25  24  Calcium 8.9 - 10.3 mg/dL - 9.3 8.9  Total Protein 6.1 - 8.1 g/dL 6.1 - -  Total Bilirubin 0.2 - 1.2 mg/dL 0.6 - -  Alkaline Phos 38 - 126 U/L - - -  AST 10 - 35 U/L 14 - -  ALT 9 - 46 U/L 10 - -    Hepatic Function Latest Ref Rng & Units 10/21/2018 01/05/2018 11/21/2016  Total Protein 6.1 - 8.1 g/dL 6.1 6.8 5.7(L)  Albumin 3.5 - 5.0 g/dL - 4.0 -  AST 10 - 35 U/L _0 ALT 9 - 46 U/L _1 Alk Phosphatase 38 - 126 U/L - 63 -  Total Bilirubin 0.2 - 1.2 mg/dL 0.6 1.0 0.6  Bilirubin, Direct 0.0 - 0.2 mg/dL 0.1 - -     Assessment:  #1.  Chronic diarrhea refractory to various therapies including antibiotics for small intestinal bacterial  overgrowth and IBS. He is finally having normal stools.  Change occurred 6 weeks ago.  He was begun on verapamil for arrhythmia but he also has eliminated number of vegetables from his diet.  Improvement is probably because of both of these factors.  Verapamil is well known to cause constipation.  I hope diarrhea would not relapse.   Plan:  Patient will continue present diet. He will call if diarrhea relapses. Office visit in 6 months.

## 2019-03-23 ENCOUNTER — Encounter: Payer: Self-pay | Admitting: Internal Medicine

## 2019-03-23 ENCOUNTER — Ambulatory Visit (INDEPENDENT_AMBULATORY_CARE_PROVIDER_SITE_OTHER): Payer: Medicare HMO | Admitting: Internal Medicine

## 2019-03-23 ENCOUNTER — Other Ambulatory Visit: Payer: Self-pay

## 2019-03-23 VITALS — BP 128/71 | HR 72 | Temp 97.8°F | Ht 69.0 in | Wt 219.2 lb

## 2019-03-23 DIAGNOSIS — I10 Essential (primary) hypertension: Secondary | ICD-10-CM | POA: Diagnosis not present

## 2019-03-23 NOTE — Patient Instructions (Signed)
Medication Instructions:  Your physician recommends that you continue on your current medications as directed. Please refer to the Current Medication list given to you today.  *If you need a refill on your cardiac medications before your next appointment, please call your pharmacy*  Lab Work: NONE  If you have labs (blood work) drawn today and your tests are completely normal, you will receive your results only by: . MyChart Message (if you have MyChart) OR . A paper copy in the mail If you have any lab test that is abnormal or we need to change your treatment, we will call you to review the results.  Testing/Procedures: NONE   Follow-Up: At [facility], you and your health needs are our priority.  As part of our continuing mission to provide you with exceptional heart care, we have created designated Provider Care Teams.  These Care Teams include your primary Cardiologist (physician) and Advanced Practice Providers (APPs -  Physician Assistants and Nurse Practitioners) who all work together to provide you with the care you need, when you need it.  Your next appointment:   1 year(s)  The format for your next appointment:   In Person  Provider:   Gregg Taylor, MD  Other Instructions Thank you for choosing [facility]!    

## 2019-03-23 NOTE — Progress Notes (Signed)
HPI [name] returns today for followup of atrial fib. He is a pleasant 86 yo man with diarrhea and atrial fib with a RVR. I started him on verapamil several months ago. Initially not much improvement but over the past few weeks he has felt much better. His diarrhea has resolved. He notes far fewer palpitations. No anginal symptoms. No edema.  No Known Allergies   Current Outpatient Medications  Medication Sig Dispense Refill  . allopurinol (ZYLOPRIM) 300 MG tablet Take 300 mg by mouth daily.    Marland Kitchen apixaban (ELIQUIS) 5 MG TABS tablet Take 1 tablet (5 mg total) by mouth 2 (two) times daily. 60 tablet 6  . Cholecalciferol (VITAMIN D3) 2000 units TABS Take 3 capsules by mouth daily.     . fish oil-omega-3 fatty acids 1000 MG capsule Take 1 g by mouth daily.      Marland Kitchen ipratropium (ATROVENT) 0.06 % nasal spray Place 2 sprays into both nostrils daily as needed for rhinitis.     Marland Kitchen LACTOBACILLUS PO Take by mouth daily.    Vladimir Faster Glycol-Propyl Glycol (SYSTANE OP) Apply 1 drop to eye 4 (four) times daily as needed (dry eyes).    . simvastatin (ZOCOR) 20 MG tablet Take 1 tablet (20 mg total) by mouth at bedtime. 90 tablet 3  . tamsulosin (FLOMAX) 0.4 MG CAPS capsule Take 0.4 mg by mouth.    . verapamil (CALAN-SR) 120 MG CR tablet Take 1 tablet (120 mg total) by mouth 2 (two) times daily. 180 tablet 3  . vitamin B-12 (CYANOCOBALAMIN) 1000 MCG tablet Take 3,000 mcg by mouth daily.      No current facility-administered medications for this visit.     Past Medical History:  Diagnosis Date  . Adenomatous colon polyp 2000   Surveillance due 2017  . Atrial flutter ([place])    a. diagnosed in 12/2017  . Chronic diarrhea   . Helicobacter pylori antibody positive 2015   treatment with Prevpac  . HTN (hypertension)   . Hypercholesteremia   . Squamous cell carcinoma     ROS:   All systems reviewed and negative except as noted in the HPI.   Past Surgical History:  Procedure Laterality Date    . BIOPSY  10/24/2016   Procedure: BIOPSY;  Surgeon: Daneil Dolin, MD;  [facility];  Service: Endoscopy;;  colon  . BIOPSY  01/06/2018   Procedure: BIOPSY;  Surgeon: Rogene Houston, MD;  [facility]: [facility];  Service: Endoscopy;;  duodenal  . COLONOSCOPY  June 2012   Dr. Gala Romney: adenomatous polyps, due for surveillance if health permits in [id number]  . COLONOSCOPY N/A 10/24/2016   Procedure: COLONOSCOPY;  Surgeon: Daneil Dolin, MD;  Location: [facility];  Service: Endoscopy;  Laterality: N/A;  2:00pm  . ESOPHAGOGASTRODUODENOSCOPY N/A 05/24/2013   Dr. Rourk:Prepyloric gastric ulcerations-likely source of hematemesis-likely NSAID-related. H.PYLORI SEROLOGY POSITIVE, TREATED WITH PREVPAC  . ESOPHAGOGASTRODUODENOSCOPY N/A 05/23/2013   [id number] EGD secondary to much clot and food debris in the stomach  . ESOPHAGOGASTRODUODENOSCOPY N/A 08/16/2013   Dr. Gala Romney: normal esophagus, stomach empty, deformity of antrum. scar present. Previously noted ulcers completed healed. Patent pylorus, normal D1 and D2  . ESOPHAGOGASTRODUODENOSCOPY N/A 01/06/2018   Procedure: ESOPHAGOGASTRODUODENOSCOPY (EGD);  Surgeon: Rogene Houston, MD;  [facility];  Service: Endoscopy;  Laterality: N/A;  . HEMORRHOID SURGERY    . POLYPECTOMY  10/24/2016   Procedure: POLYPECTOMY;  Surgeon: Daneil Dolin, MD;  [facility];  Service: Endoscopy;;  colon  . shoulder       Family History  Problem Relation Age of Onset  . Stroke Mother   . Stroke Father   . Pancreatic cancer Sister   . Leukemia Brother   . Lung cancer Sister   . Colon cancer Neg Hx      Social History   Socioeconomic History  . Marital status: Married    Spouse name: Not on file  . Number of children: 4  . Years of education: Not on file  . Highest education level: Not on file  Occupational History  . Occupation: retired    Comment: Press photographer  Tobacco Use  . Smoking status: Former Smoker    Packs/day:  2.00    Years: 50.00    Pack years: 100.00    Types: Cigarettes    Quit date: 07/28/2000    Years since quitting: 18.6  . Smokeless tobacco: Former Systems developer    Quit date: 02/25/1999  Substance and Sexual Activity  . Alcohol use: No    Comment: couple glasses wine/week, occ beer or rmixed drink  . Drug use: No    Frequency: 2.0 times per week  . Sexual activity: Not on file  Other Topics Concern  . Not on file  Social History Narrative   Lives w/ wife         Social Determinants of Health   Financial Resource Strain:   . Difficulty of Paying Living Expenses: Not on file  Food Insecurity:   . Worried About Charity fundraiser in the Last Year: Not on file  . Ran Out of Food in the Last Year: Not on file  Transportation Needs:   . Lack of Transportation (Medical): Not on file  . Lack of Transportation (Non-Medical): Not on file  Physical Activity:   . Days of Exercise per Week: Not on file  . Minutes of Exercise per Session: Not on file  Stress:   . Feeling of Stress : Not on file  Social Connections:   . Frequency of Communication with Friends and Family: Not on file  . Frequency of Social Gatherings with Friends and Family: Not on file  . Attends Religious Services: Not on file  . Active Member of Clubs or Organizations: Not on file  . Attends Archivist Meetings: Not on file  . Marital Status: Not on file  Intimate Partner Violence:   . Fear of Current or Ex-Partner: Not on file  . Emotionally Abused: Not on file  . Physically Abused: Not on file  . Sexually Abused: Not on file     BP 128/71   Pulse 72   Temp 97.8 F (36.6 C)   Ht 5\' 9"  (1.753 m)   Wt 219 lb 3.2 oz (99.4 kg)   SpO2 99%   BMI 32.37 kg/m   Physical Exam:  Well appearing NAD HEENT: Unremarkable Neck:  No JVD, no thyromegally Lymphatics:  No adenopathy Back:  No CVA tenderness Lungs:  Clear with no wheezes HEART:  Regular rate rhythm, no murmurs, no rubs, no clicks Abd:  soft,  positive bowel sounds, no organomegally, no rebound, no guarding Ext:  2 plus pulses, no edema, no cyanosis, no clubbing Skin:  No rashes no nodules Neuro:  CN II through XII intact, motor grossly intact  Assess/Plan: 1. Atrial fib - on exam today he has reverted back to NSR. I have recommended watchful waiting and continued verapamil and  eliquis. 2. Diarrhea - he is now having only one bm a day. We will follow. 3. HTN - his bp is controlled. I encouraged him to lose 20 lbs. 4. Dyslipidemia - he will continue his statin therapy.   Mikle Bosworth.D.

## 2019-04-05 NOTE — Telephone Encounter (Signed)
This encounter was created in error - please disregard.

## 2019-04-20 ENCOUNTER — Ambulatory Visit: Payer: Medicare HMO | Admitting: Internal Medicine

## 2019-04-27 ENCOUNTER — Other Ambulatory Visit: Payer: Self-pay | Admitting: Internal Medicine

## 2019-04-27 ENCOUNTER — Other Ambulatory Visit: Payer: Self-pay | Admitting: Dermatology

## 2019-04-27 DIAGNOSIS — C4491 Basal cell carcinoma of skin, unspecified: Secondary | ICD-10-CM

## 2019-04-27 HISTORY — DX: Basal cell carcinoma of skin, unspecified: C44.91

## 2019-05-14 IMAGING — DX DG HAND COMPLETE 3+V*R*
3 series · 3 of 3 positions shown · non-contrast
Comparison: None.

CLINICAL DATA: Right hand laceration secondary to a fall.

EXAM:
RIGHT HAND - COMPLETE 3+ VIEW

[hand pa]
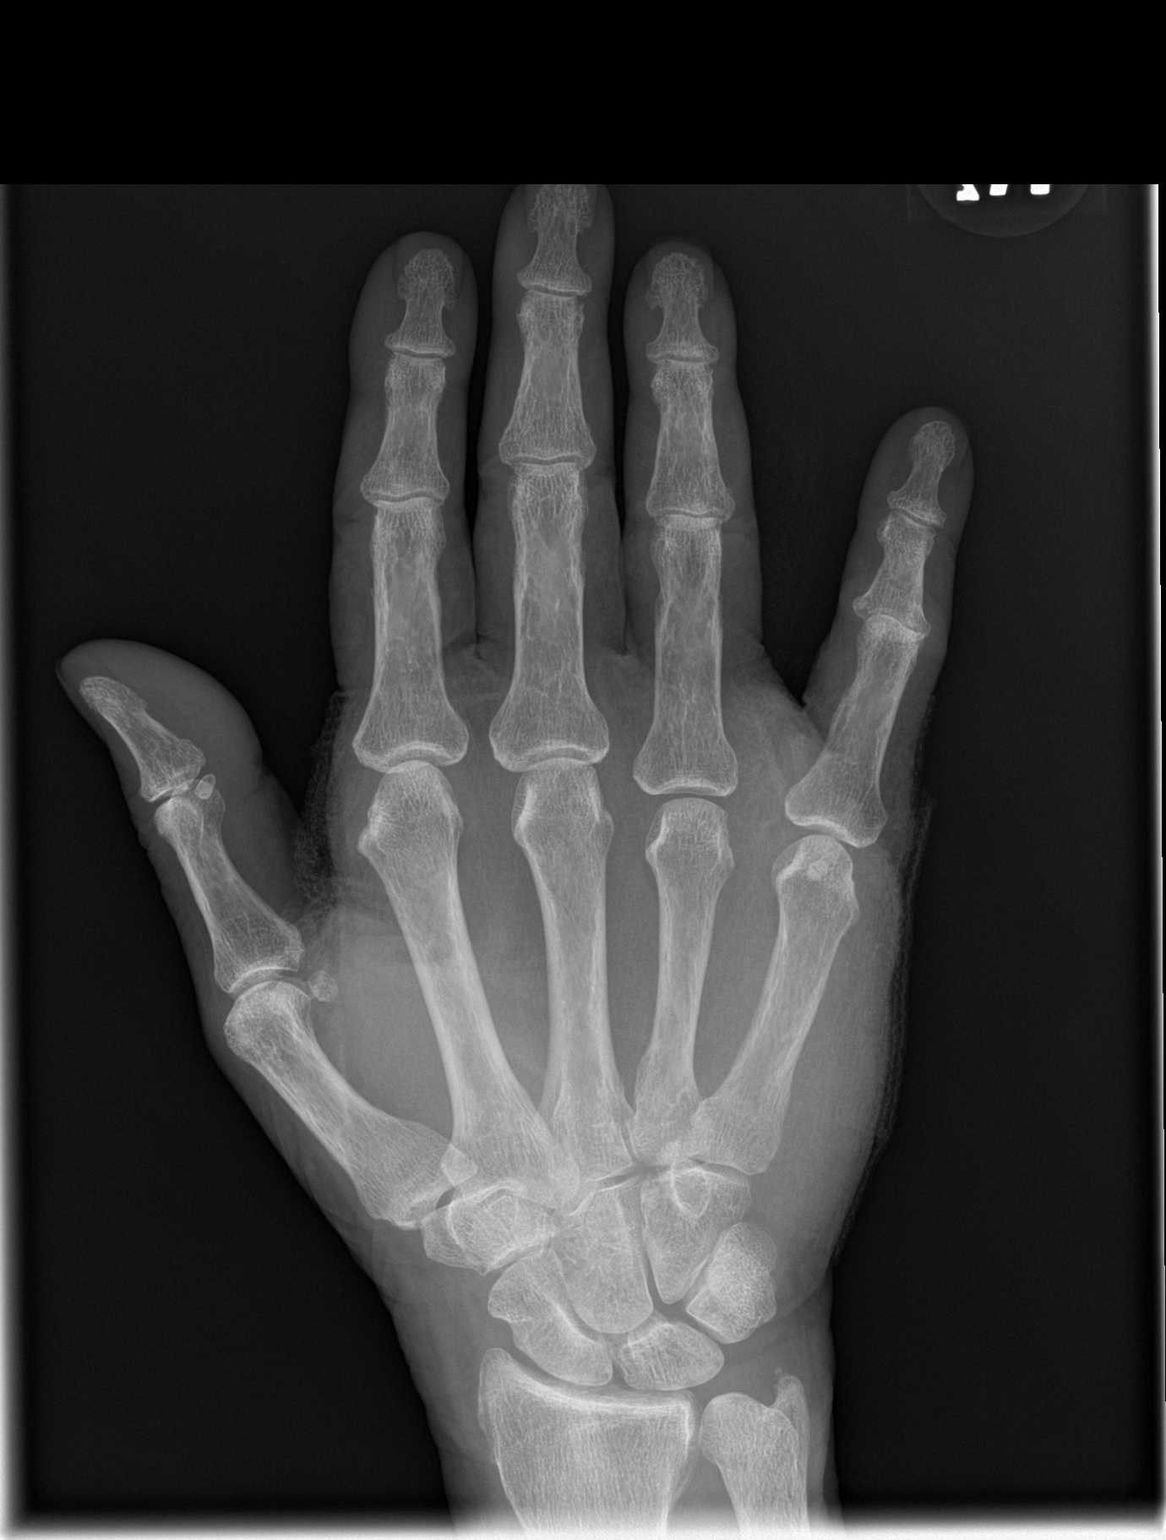

[hand obl]
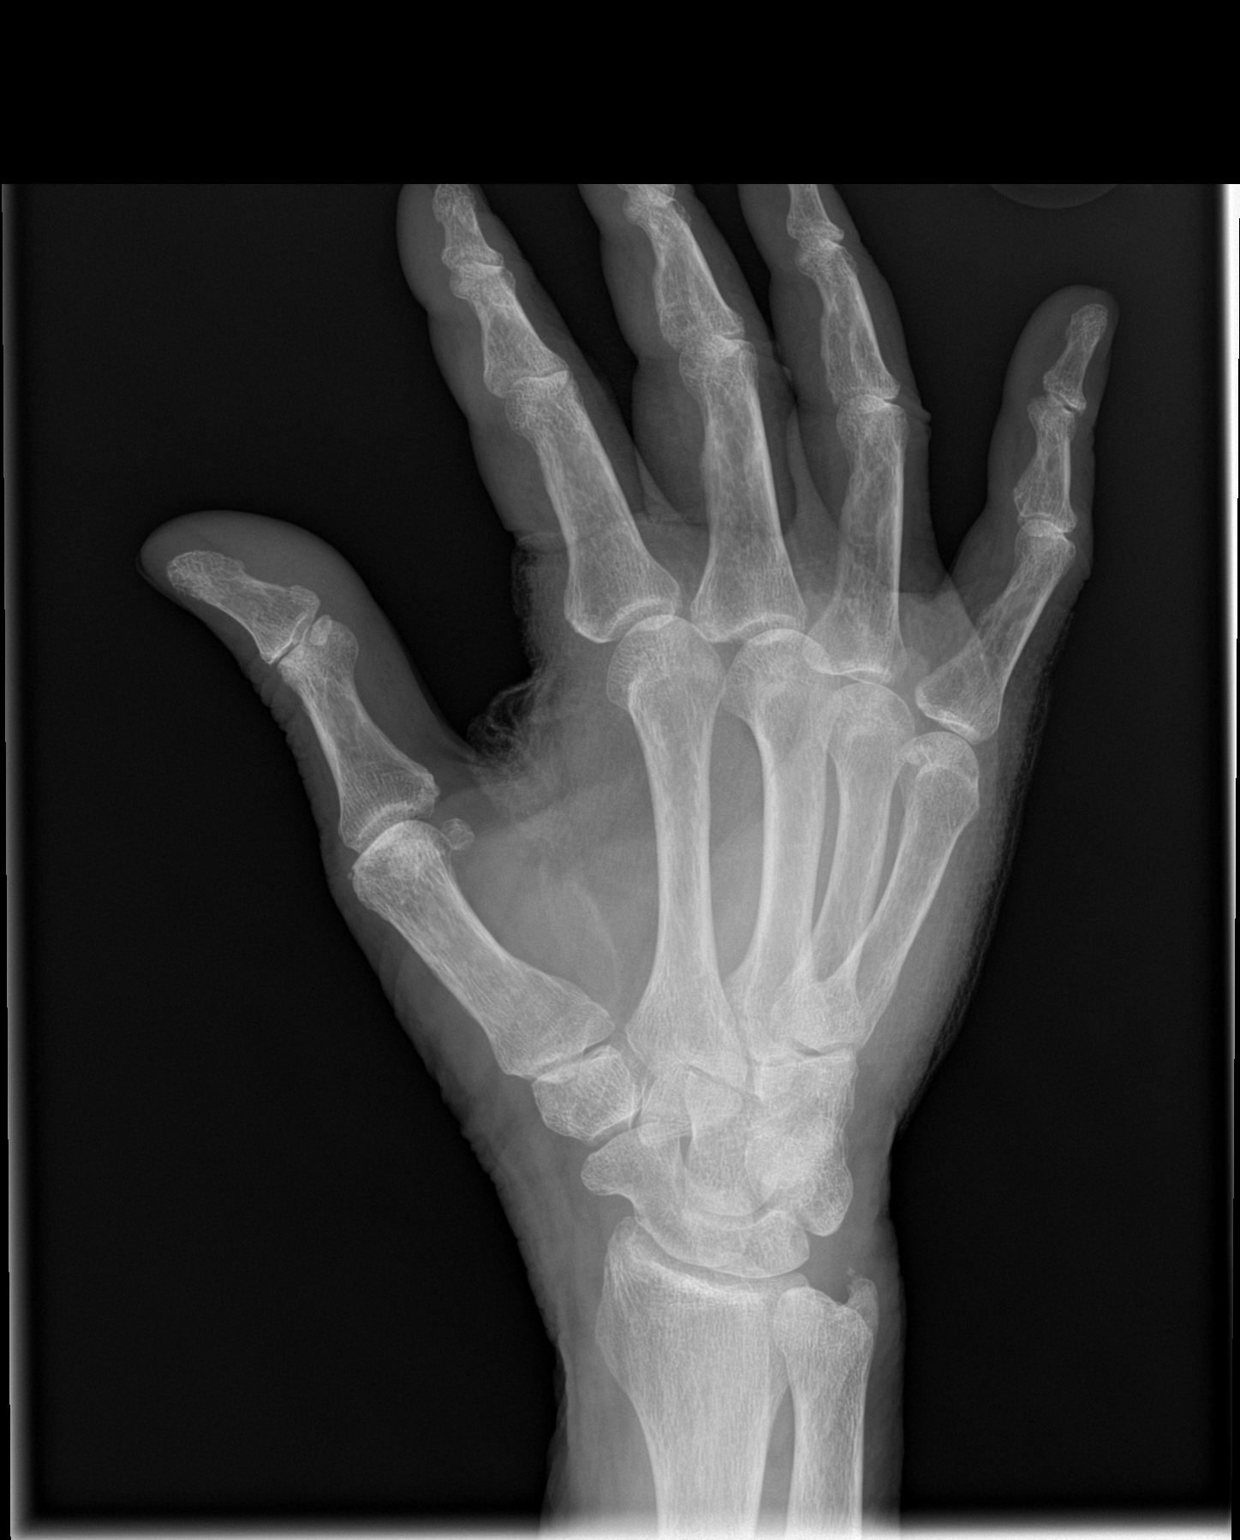

[hand lat]
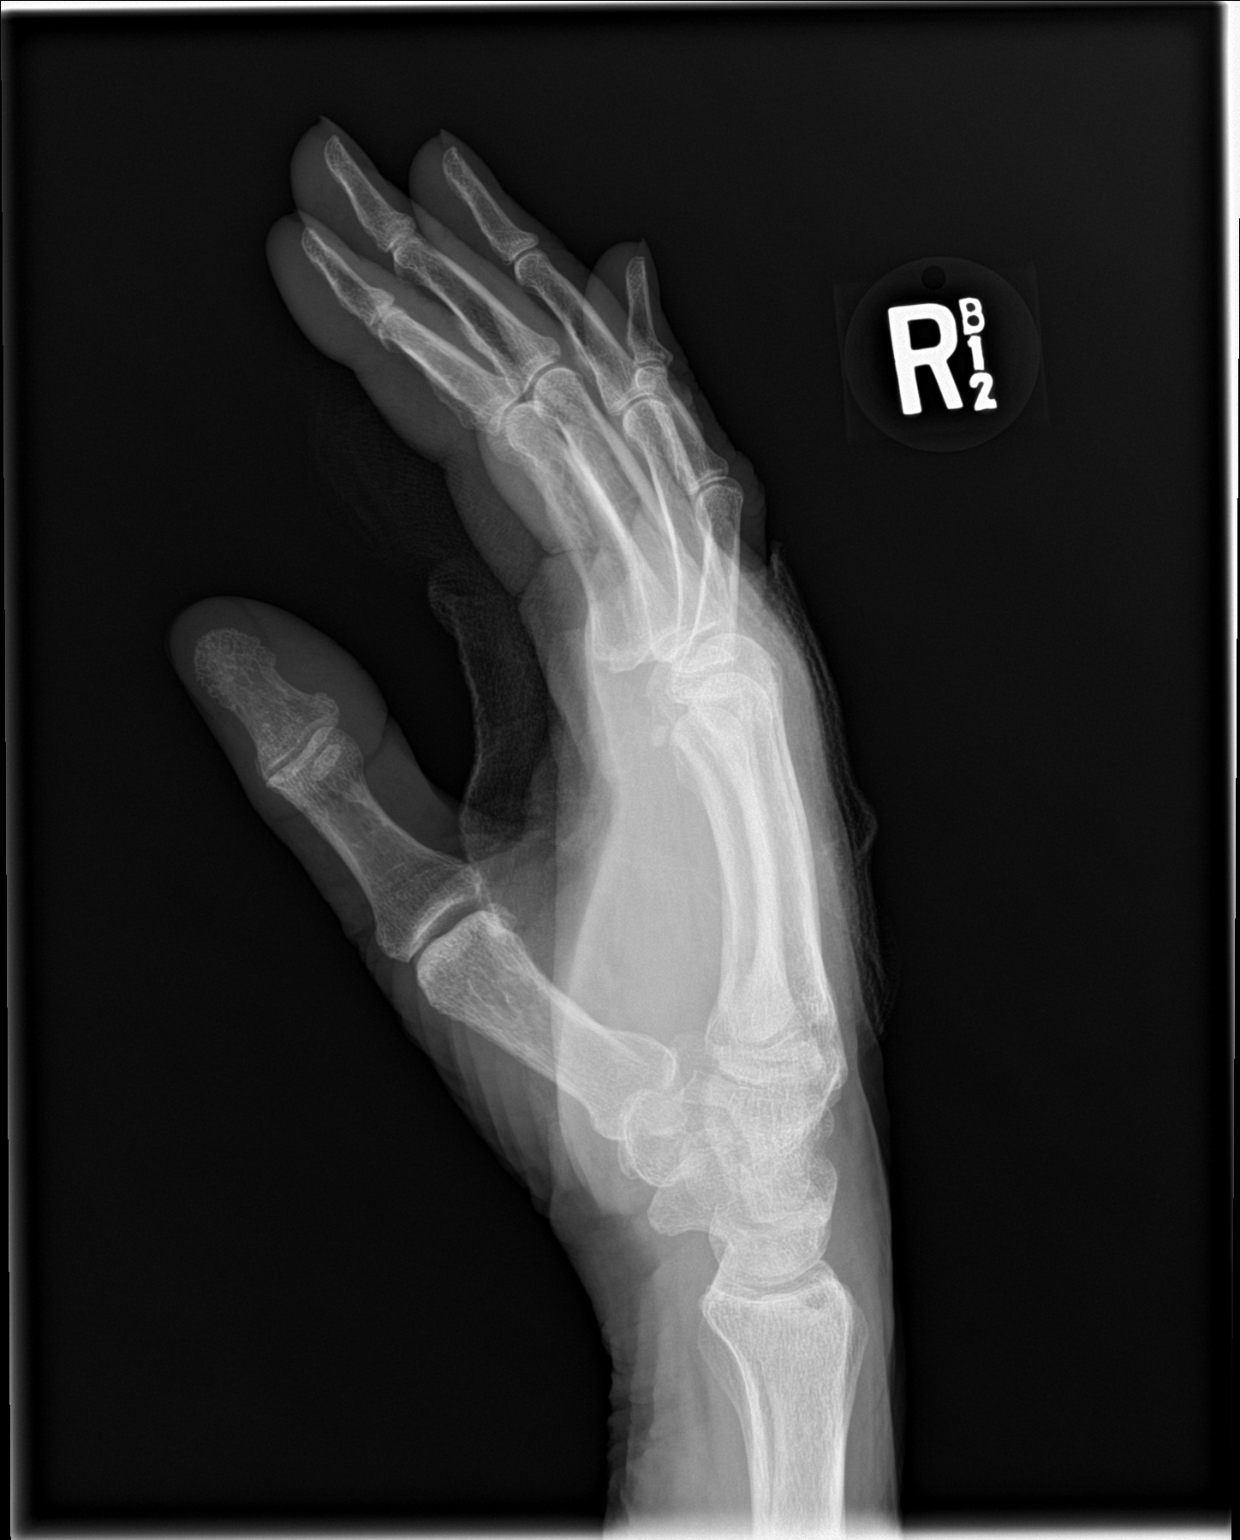

[3 of 3 positions shown; findings below may reference images not displayed]

FINDINGS: There is no fracture or dislocation. Slight arthritic changes of the
DIP joints of the fingers. No other abnormality.
IMPRESSION: No acute abnormality.  Minimal arthritic changes.

## 2019-05-19 ENCOUNTER — Encounter: Payer: Self-pay | Admitting: Dermatology

## 2019-05-19 ENCOUNTER — Ambulatory Visit (INDEPENDENT_AMBULATORY_CARE_PROVIDER_SITE_OTHER): Payer: Medicare HMO | Admitting: Dermatology

## 2019-05-19 ENCOUNTER — Other Ambulatory Visit: Payer: Self-pay

## 2019-05-19 DIAGNOSIS — D0439 Carcinoma in situ of skin of other parts of face: Secondary | ICD-10-CM

## 2019-05-19 DIAGNOSIS — D099 Carcinoma in situ, unspecified: Secondary | ICD-10-CM

## 2019-05-19 DIAGNOSIS — C44612 Basal cell carcinoma of skin of right upper limb, including shoulder: Secondary | ICD-10-CM | POA: Diagnosis not present

## 2019-05-19 NOTE — Patient Instructions (Addendum)
Biopsy, Surgery (Curettage) & Surgery (Excision) Aftercare Instructions  1. Okay to remove bandage in 24 hours  2. Wash area with soap and water  3. Apply Vaseline to area twice daily until healed (Not Neosporin)  4. Okay to cover with a Band-Aid to decrease the chance of infection or prevent irritation from clothing; also it's okay to uncover lesion at home.  5. Suture instructions: return to our office in 7-10 or 10-14 days for a nurse visit for suture removal. Variable healing with sutures, if pain or itching occurs call our office. It's okay to shower or bathe 24 hours after sutures are given.  6. The following risks may occur after a biopsy, curettage or excision: bleeding, scarring, discoloration, recurrence, infection (redness, yellow drainage, pain or swelling).  7. For questions, concerns and results call our office at [place] before 4pm & Friday before 3pm. Biopsy results will be available in 1 week.  I reviewed with [name] all of his recent biopsies.  Today week treated the spot on the right inner shoulder; triple curettage showed it to be more wide than deep so the base was cauterized and treated with fluorouracil.  He was informed to expect an abrasion on this area for 2 to 4 weeks; there are no restrictions on bathing or activity.  I will schedule to do the right tip of the nose at a later date with enough time in case it needs to be excised.  [name] also pointed out crusts on his right outer shoulder and right inner cheek; these sometimes become sensitive will decide when he returns whether any of these need to be removed.  He has a small white hard dot under his left eye which is a milium and he is comfortable leaving this.  A precancer on the left ear was treated with liquid nitrogen freeze.  Venous lakes on the right ear require no intervention.  [name] knows that he can call me at any time if there are any  problems.

## 2019-05-20 NOTE — Progress Notes (Addendum)
   Follow-Up Visit   Subjective  Juan Quinn is a 86 y.o. adult who presents for the following: Procedure (here for treatment of scc in situ tip of right nose and bcc top right shoulder).  BCC Location: Right shoulder Duration:  Quality:  Associated Signs/Symptoms: Modifying Factors:  Severity:  Timing: Context: For treatment  The following portions of the chart were reviewed this encounter and updated as appropriate:     Objective  Well appearing patient in no apparent distress; mood and affect are within normal limits.  All skin waist up examined.   Assessment & Plan  Basal cell carcinoma (BCC) of skin of right upper extremity including shoulder Top right shoulder  Destruction of lesion Complexity: extensive   Destruction method: electrodesiccation and curettage   Destruction method comment:  Curettage plus innoculation with parenteral fluorouracil Informed consent: discussed and consent obtained   Timeout:  patient name, date of birth, surgical site, and procedure verified Procedure prep:  Patient was prepped and draped in usual sterile fashion Prep type:  Chlorhexidine Anesthesia: the lesion was anesthetized in a standard fashion   Anesthetic:  1% lidocaine w/ epinephrine 1-100,000 local infiltration Curettage performed in three different directions: Yes   Electrodesiccation performed over the curetted area: Yes   Curettage cycles:  3 Lesion length (cm):  2.2 Lesion width (cm):  1 Margin per side (cm):  0 Final wound size (cm):  2.2 Hemostasis achieved with:  ferric subsulfate Post-procedure details: wound care instructions given   Additional details:  Focal depth so cauterized plus innoculated with parenteral 5FU  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

## 2019-06-06 ENCOUNTER — Encounter: Payer: Self-pay | Admitting: Internal Medicine

## 2019-06-11 ENCOUNTER — Other Ambulatory Visit: Payer: Self-pay | Admitting: Internal Medicine

## 2019-06-13 ENCOUNTER — Ambulatory Visit (INDEPENDENT_AMBULATORY_CARE_PROVIDER_SITE_OTHER): Payer: Medicare HMO | Admitting: Internal Medicine

## 2019-06-13 ENCOUNTER — Other Ambulatory Visit: Payer: Self-pay

## 2019-06-13 ENCOUNTER — Encounter (INDEPENDENT_AMBULATORY_CARE_PROVIDER_SITE_OTHER): Payer: Self-pay | Admitting: Internal Medicine

## 2019-06-13 VITALS — BP 111/60 | HR 80 | Temp 97.2°F | Ht 69.0 in | Wt 218.6 lb

## 2019-06-13 DIAGNOSIS — R143 Flatulence: Secondary | ICD-10-CM

## 2019-06-13 DIAGNOSIS — K529 Noninfective gastroenteritis and colitis, unspecified: Secondary | ICD-10-CM | POA: Diagnosis not present

## 2019-06-13 MED ORDER — METRONIDAZOLE 250 MG PO TABS: 250.0000 mg | ORAL_TABLET | Freq: Three times a day (TID) | ORAL | 0 refills | Status: DC

## 2019-06-13 NOTE — Patient Instructions (Signed)
Please call office with progress report when you finish antibiotic

## 2019-06-13 NOTE — Progress Notes (Signed)
Presenting complaint;  Follow-up for chronic diarrhea and flatulence.  Database and subjective:  Patient is 86 year old Caucasian male with chronic diarrhea who has undergone extensive work-up both locally and at tertiary center presents for scheduled visit.  He was last seen 6 months ago when he was doing much better.  His diarrhea resolved but he was given verapamil for SVT.  I believe his diarrhea is multifactorial including IBS. He states he is not doing well.  He states he has not felt good for the last 6 months.  He has no pain.  His bowels move daily or every other day.  He is passing formed stool.  According to his wife this morning he had an accident while he was in bed.  He passed small to moderate amount of mushy stool.  This is the only time he has had accidents since he was last seen.  He denies melena or rectal bleeding.  He also complains of excessive flatulence.  A lot of times he does not even have control which is very embarrassing to him.Marland Kitchen  He states his appetite is good but he does not eat a lot.  He has gained 9 pounds since his last visit 6 months ago. His wife states he does not do any physical activity other than walking to the mailbox and using a riding lawnmower.  He also complains of difficulty voiding and has to get up at least 2-3 times every night.  He has difficulty sleeping.  Current Medications: Outpatient Encounter Medications as of 06/13/2019  Medication Sig  . allopurinol (ZYLOPRIM) 300 MG tablet Take 300 mg by mouth daily.  . Cholecalciferol (VITAMIN D3) 2000 units TABS Take 3 capsules by mouth daily.   Marland Kitchen ELIQUIS 5 MG TABS tablet TAKE 1 TABLET BY MOUTH TWICE A DAY  . fish oil-omega-3 fatty acids 1000 MG capsule Take 1 g by mouth daily.    Marland Kitchen ipratropium (ATROVENT) 0.06 % nasal spray Place 2 sprays into both nostrils daily as needed for rhinitis.   Marland Kitchen LACTOBACILLUS PO Take by mouth daily.  Vladimir Faster Glycol-Propyl Glycol (SYSTANE OP) Apply 1 drop to eye 4 (four)  times daily as needed (dry eyes).  . simvastatin (ZOCOR) 20 MG tablet Take 1 tablet (20 mg total) by mouth at bedtime.  Marland Kitchen terazosin (HYTRIN) 5 MG capsule Take 5 mg by mouth daily.  . verapamil (CALAN-SR) 120 MG CR tablet TAKE 1 TABLET BY MOUTH TWICE A DAY  . vitamin B-12 (CYANOCOBALAMIN) 1000 MCG tablet Take 3,000 mcg by mouth daily.   . [DISCONTINUED] tamsulosin (FLOMAX) 0.4 MG CAPS capsule Take 0.4 mg by mouth.   No facility-administered encounter medications on file as of 06/13/2019.     Objective: Blood pressure 111/60, pulse 80, temperature (!) 97.2 F (36.2 C), temperature source Temporal, height 5\' 9"  (1.753 m), weight 218 lb 9.6 oz (99.2 kg). Patient is alert and in no acute distress. He is wearing a facial mask. Conjunctiva is pink. Sclera is nonicteric Oropharyngeal mucosa is normal. He has upper and lower dentures in place. No neck masses or thyromegaly noted. Cardiac exam with irregular rhythm normal S1 and S2.  He has grade 2/6 systolic murmur best heard at aortic area. Lungs are clear to auscultation. Abdomen is full.  Bowel sounds are hyperactive.  On palpation abdomen is soft and nontender with no organomegaly or masses.  Percussion note is very sympathetic in upper and mid abdomen. He has 1-2+ pitting edema involving his legs.   Assessment:  #  1.  Chronic diarrhea and flatulence.  He has undergone extensive evaluation.  He had variable response to various medications including antibiotics.  He has been having near normal bowel movement since he has been on calcium channel blocker.  However his bloating has gotten worse lately.  Alcohol is felt that he has small intestinal bacterial overgrowth although he has not responded to therapy as expected.  Since his flatulence has become intractable it would be reasonable to treat him again and see what happens.   Plan:  Medication list updated. Metronidazole 250 mg by mouth 3 times a day after each meal for 10 days. Patient  advised to do daily symptom monitoring and call with progress for when he finishes antibiotic. Office visit in 6 months.

## 2019-06-21 ENCOUNTER — Ambulatory Visit (INDEPENDENT_AMBULATORY_CARE_PROVIDER_SITE_OTHER): Payer: Medicare HMO | Admitting: Internal Medicine

## 2019-06-29 ENCOUNTER — Encounter: Payer: Self-pay | Admitting: *Deleted

## 2019-06-29 ENCOUNTER — Telehealth (INDEPENDENT_AMBULATORY_CARE_PROVIDER_SITE_OTHER): Payer: Self-pay | Admitting: *Deleted

## 2019-06-29 NOTE — Telephone Encounter (Signed)
Patient's wife called a with a progress report. She states that he felt better last week.  He now is not having BM's . The last one was last week and patient reports that it was about a 1/2 cup. His wife states that his Cardiologist and Dr.Rehman were concerned about the medication Verapamil may cause Constipation.  She also wanted to know what he could take to help him go. Per Dr.Rehman the patient may take Miralax 1/2 scoop today and for the next few days. Start the Colace 2 by mouth a day tomorrow. Also follow up with the Cardiologist and see if maybe the dosage of Verapamil could be decreased.  Patient's wife called and made aware.

## 2019-06-30 ENCOUNTER — Other Ambulatory Visit: Payer: Self-pay

## 2019-06-30 ENCOUNTER — Encounter: Payer: Self-pay | Admitting: Dermatology

## 2019-06-30 ENCOUNTER — Telehealth: Payer: Self-pay

## 2019-06-30 ENCOUNTER — Ambulatory Visit (INDEPENDENT_AMBULATORY_CARE_PROVIDER_SITE_OTHER): Payer: Medicare HMO | Admitting: Dermatology

## 2019-06-30 DIAGNOSIS — D099 Carcinoma in situ, unspecified: Secondary | ICD-10-CM

## 2019-06-30 DIAGNOSIS — D0439 Carcinoma in situ of skin of other parts of face: Secondary | ICD-10-CM

## 2019-06-30 DIAGNOSIS — C44311 Basal cell carcinoma of skin of nose: Secondary | ICD-10-CM

## 2019-06-30 DIAGNOSIS — L910 Hypertrophic scar: Secondary | ICD-10-CM | POA: Diagnosis not present

## 2019-06-30 DIAGNOSIS — D485 Neoplasm of uncertain behavior of skin: Secondary | ICD-10-CM

## 2019-06-30 MED ORDER — TRIAMCINOLONE ACETONIDE 10 MG/ML IJ SUSP
10.0000 mg | Freq: Once | INTRAMUSCULAR | Status: AC
  Administered 2019-06-30: 10 mg

## 2019-06-30 NOTE — Telephone Encounter (Signed)
Ms.Frogge feels verapamil is causing diarrhea for Mr.Marschall   Please call [phone]  Thanks renee

## 2019-06-30 NOTE — Patient Instructions (Signed)

## 2019-06-30 NOTE — Telephone Encounter (Addendum)
Pt  has constipation, per Dr.Taylor says he can switch to Diltiazem 120 mg qd as they just picked new rx for calan, wife wants to try miralax first. She will call mr in 5 days.

## 2019-06-30 NOTE — Progress Notes (Signed)
Follow-Up Visit   Subjective  Juan Quinn is a 86 y.o. adult who presents for the following: Skin Cancer (here for tx- tip of right nose-scc).  CIS Location: Tip Duration:  Quality:  Associated Signs/Symptoms: Modifying Factors:  Severity:  Timing: Context: For treatment  The following portions of the chart were reviewed this encounter and updated as appropriate:     Objective  Well appearing patient in no apparent distress; mood and affect are within normal limits.  Waist up skin examination.   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  Destruction of lesion Complexity: simple   Destruction method: electrodesiccation and curettage   Destruction method comment:  Curettage plus innoculation with parenteral fluorouracil Informed consent: discussed and consent obtained   Timeout:  patient name, date of birth, surgical site, and procedure verified Anesthesia: the lesion was anesthetized in a standard fashion   Anesthetic:  1% lidocaine w/ epinephrine 1-100,000 local infiltration Curettage performed in three different directions: Yes     Electrodesiccation performed over the curetted area: No   Curettage cycles:  3 Lesion length (cm):  0.8 Lesion width (cm):  1 Margin per side (cm):  0 Final wound size (cm):  1 Hemostasis achieved with:  ferric subsulfate Outcome: patient tolerated procedure well with no complications   Post-procedure details: wound care instructions given   Additional details:  Curetted base inoculated with 5% fluorouracil solution  Keloid Right Shoulder - Posterior  Intralesional injection - Right Shoulder - Posterior Location: Right posterior Shoulder  Informed Consent: Discussed risks (infection, pain, bleeding, bruising, thinning of the skin, loss of skin pigment, lack of resolution, and recurrence of lesion) and benefits of the procedure, as well as the alternatives. Informed consent was  obtained. Preparation: The area was prepared a standard fashion.  Procedure Details: An intralesional injection was performed with Triamcinolone 40 mg/cc. 0.2 cc in total were injected.  Total number of injections: 1  Plan: The patient was instructed on post-op care. Recommend OTC analgesia as needed for pain.   Neoplasm of uncertain behavior of skin (2) Right Nasal Sidewall  Skin / nail biopsy Type of biopsy: tangential   Informed consent: discussed and consent obtained   Timeout: patient name, date of birth, surgical site, and procedure verified   Anesthesia: the lesion was anesthetized in a standard fashion   Anesthetic:  1% lidocaine w/ epinephrine 1-100,000 local infiltration Instrument used: flexible razor blade   Hemostasis achieved with: ferric subsulfate   Outcome: patient tolerated procedure well   Post-procedure details: sterile dressing applied and wound care instructions given   Dressing type: petrolatum   Additional details:  Patient identified lesion of concern.  Lesion identified by physician.  Specimen 1 - Surgical pathology Differential Diagnosis: BCC vs SCC Check Margins: No  Left Nasal Sidewall  Skin / nail biopsy Type of biopsy: tangential   Informed consent: discussed and consent obtained   Timeout: patient name, date of birth, surgical site, and procedure verified   Anesthesia: the lesion was anesthetized in a standard fashion   Anesthetic:  1% lidocaine w/ epinephrine 1-100,000 local infiltration Instrument used: flexible razor blade   Hemostasis achieved with: ferric subsulfate   Outcome: patient tolerated procedure well   Post-procedure details: sterile dressing applied and wound care instructions given   Dressing type: petrolatum   Additional details:  Patient identified lesion of concern.  Lesion identified by physician.  Specimen 2 - Surgical pathology Differential  Diagnosis: BCC vs SCC Check Margins: No

## 2019-07-02 ENCOUNTER — Encounter: Payer: Self-pay | Admitting: Dermatology

## 2019-07-02 NOTE — Addendum Note (Signed)
Addended by: Lavonna Monarch on: 07/02/2019 07:13 PM   Modules accepted: Orders

## 2019-07-03 MED ORDER — TRIAMCINOLONE ACETONIDE 40 MG/ML IJ SUSP: 40.0000 mg | Freq: Once | INTRAMUSCULAR | Status: DC

## 2019-07-04 ENCOUNTER — Telehealth: Payer: Self-pay | Admitting: Dermatology

## 2019-07-04 ENCOUNTER — Telehealth: Payer: Self-pay | Admitting: *Deleted

## 2019-07-04 NOTE — Telephone Encounter (Signed)
Left message for patient to call back  

## 2019-07-04 NOTE — Telephone Encounter (Signed)
Path to patients wife told her we will have to schedule mohs surgery with Dr. Link Snuffer or pearce. Someone will call with appointment time and date.

## 2019-07-04 NOTE — Telephone Encounter (Signed)
Said someone called

## 2019-07-04 NOTE — Telephone Encounter (Signed)
Wife, Joaquim Lai, returned call

## 2019-07-04 NOTE — Telephone Encounter (Signed)
-----   Message from Lavonna Monarch, MD sent at 07/02/2019  8:06 AM EDT ----- Pt thought nasal lesion was just scratch.  Infiltrative BCC best treated by Mohs, Dr. Link Snuffer or Danielle Dess

## 2019-07-18 ENCOUNTER — Ambulatory Visit: Payer: Medicare HMO | Admitting: Urology

## 2019-07-18 ENCOUNTER — Other Ambulatory Visit: Payer: Self-pay

## 2019-07-18 ENCOUNTER — Encounter: Payer: Self-pay | Admitting: Urology

## 2019-07-18 VITALS — BP 112/70 | HR 86 | Temp 96.8°F | Ht 68.0 in | Wt 218.0 lb

## 2019-07-18 DIAGNOSIS — R351 Nocturia: Secondary | ICD-10-CM | POA: Diagnosis not present

## 2019-07-18 DIAGNOSIS — R3911 Hesitancy of micturition: Secondary | ICD-10-CM | POA: Diagnosis not present

## 2019-07-18 LAB — BLADDER SCAN AMB NON-IMAGING: Scan Result: 29.7

## 2019-07-18 MED ORDER — ALFUZOSIN HCL ER 10 MG PO TB24: 10.0000 mg | ORAL_TABLET | Freq: Every day | ORAL | 11 refills | Status: DC

## 2019-07-18 NOTE — Patient Instructions (Signed)

## 2019-07-18 NOTE — Progress Notes (Signed)
07/18/2019 3:49 PM   Juan Quinn 22-Dec-1930 [id number]  Referring provider: Lucia Gaskins, MD [place],  [place] [id number]  Nocturia and urinary hesitancy  HPI: Juan Quinn is a 86yo here for evaluation of nocturia and urinary hesitancy. The patients BPH has been managed by DR. Dondiego. The patient has had nocturia 2-4x, urinary frequency, urinary hesitancy, and starting/stopping of urinary stream. He was starting on flomax initially which did improve the nocturia. He was then started on hytrin which has failed to improve his LUTS. His stream is weak.     PMH: Past Medical History:  Diagnosis Date  . Adenomatous colon polyp 2000   Surveillance due 2017  . Atrial fibrillation ([place])   . Atrial flutter ([place])    a. diagnosed in 12/2017  . BCC (basal cell carcinoma of skin) 04/27/2019   Top Right Shoulder (curet, cautery and 5FU)  . Chronic diarrhea   . Gout   . Heart murmur   . Helicobacter pylori antibody positive 2015   treatment with Prevpac  . History of stomach ulcers   . HTN (hypertension)   . Hypercholesteremia   . 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 Nodular basal cell carcinoma 11/11/2017   Right Post Auricular (treatment after biopsy)  . Nodular basal cell carcinoma 09/28/2018   Right Sideburn (treatment after biopsy)  . Nodular basal cell carcinoma 09/28/2018   Right Front Neck (treatment after biopsy)  . SCC (squamous cell carcinoma) 10/26/2014   Right Ear(in situ) (curet, cautery, and 5FU)  . SCC (squamous cell carcinoma) 10/26/2014   Right Upper Lip(in situ) (LN2 60 second freeze)  . SCC (squamous cell carcinoma) 07/01/2017   Left Sideburn(in situ) (treatment after biopsy)  . SCC (squamous cell carcinoma) 07/01/2017   Right Inner Cheek(in situ) (treatment after biopsy)  . SCC (squamous cell carcinoma) 11/11/2017   Right Inner Cheek Inf.(in situ) (treatment after biopsy)  . SCC (squamous cell carcinoma)  09/28/2018   Right Helix Mid(in situ) (treatment after biopsy)  . SCC (squamous cell carcinoma) 09/28/2018   Right Inner Cheek,Inf(in situ) (treatment after biopsy)  . SCC (squamous cell carcinoma) 04/27/2019   Tip of right nose(in situ) - CX3+5FU  . Squamous cell carcinoma of skin 10/26/2014   Below Left Ear(in situ) (curet, cautery, and 5FU)  . Superficial basal cell carcinoma 10/26/2014   Top Right Foot (treatment after biopsy)  . Superficial nodular basal cell carcinoma 10/26/2014   Right Forearm (treatment after biopsy)    Surgical History: Past Surgical History:  Procedure Laterality Date  . BIOPSY  10/24/2016   Procedure: BIOPSY;  Surgeon: Daneil Dolin, MD;  [facility];  Service: Endoscopy;;  colon  . BIOPSY  01/06/2018   Procedure: BIOPSY;  Surgeon: Rogene Houston, MD;  [facility];  Service: Endoscopy;;  duodenal  . COLONOSCOPY  June 2012   Dr. Gala Romney: adenomatous polyps, due for surveillance if health permits in [id number]  . COLONOSCOPY N/A 10/24/2016   Procedure: COLONOSCOPY;  Surgeon: Daneil Dolin, MD;  [facility];  Service: Endoscopy;  Laterality: N/A;  2:00pm  . ESOPHAGOGASTRODUODENOSCOPY N/A 05/24/2013   Dr. Rourk:Prepyloric gastric ulcerations-likely source of hematemesis-likely NSAID-related. H.PYLORI SEROLOGY POSITIVE, TREATED WITH PREVPAC  . ESOPHAGOGASTRODUODENOSCOPY N/A 05/23/2013   [id number] EGD secondary to much clot and food debris in the stomach  . ESOPHAGOGASTRODUODENOSCOPY N/A 08/16/2013   Dr. Gala Romney: normal esophagus, stomach empty, deformity of antrum. scar present. Previously  noted ulcers completed healed. Patent pylorus, normal D1 and D2  . ESOPHAGOGASTRODUODENOSCOPY N/A 01/06/2018   Procedure: ESOPHAGOGASTRODUODENOSCOPY (EGD);  Surgeon: Rogene Houston, MD;  [facility];  Service: Endoscopy;  Laterality: N/A;  . HEMORRHOID SURGERY    . POLYPECTOMY  10/24/2016   Procedure: POLYPECTOMY;  Surgeon:  Daneil Dolin, MD;  [facility];  Service: Endoscopy;;  colon  . shoulder      Home Medications:  Allergies as of 07/18/2019   No Known Allergies     Medication List       Accurate as of Jul 18, 2019  3:49 PM. If you have any questions, ask your nurse or doctor.        allopurinol 300 MG tablet Commonly known as: ZYLOPRIM Take 300 mg by mouth daily.   Eliquis 5 MG Tabs tablet Generic drug: apixaban TAKE 1 TABLET BY MOUTH TWICE A DAY   fish oil-omega-3 fatty acids 1000 MG capsule Take 1 g by mouth daily.   ipratropium 0.06 % nasal spray Commonly known as: ATROVENT Place 2 sprays into both nostrils daily as needed for rhinitis.   LACTOBACILLUS PO Take by mouth daily.   metroNIDAZOLE 250 MG tablet Commonly known as: FLAGYL Take 1 tablet (250 mg total) by mouth 3 (three) times daily.   simvastatin 10 MG tablet Commonly known as: ZOCOR Take 10 mg by mouth daily.   simvastatin 20 MG tablet Commonly known as: ZOCOR Take 1 tablet (20 mg total) by mouth at bedtime.   SYSTANE OP Apply 1 drop to eye 4 (four) times daily as needed (dry eyes).   tamsulosin 0.4 MG Caps capsule Commonly known as: FLOMAX Take 0.4 mg by mouth.   terazosin 5 MG capsule Commonly known as: HYTRIN Take 5 mg by mouth daily.   verapamil 120 MG CR tablet Commonly known as: CALAN-SR TAKE 1 TABLET BY MOUTH TWICE A DAY   vitamin B-12 1000 MCG tablet Commonly known as: CYANOCOBALAMIN Take 3,000 mcg by mouth daily.   Vitamin D3 50 MCG (2000 UT) Tabs Take 3 capsules by mouth daily.       Allergies: No Known Allergies  Family History: Family History  Problem Relation Age of Onset  . Stroke Mother   . Stroke Father   . Pancreatic cancer Sister   . Leukemia Brother   . Lung cancer Sister   . Colon cancer Neg Hx     Social History:  reports that he quit smoking about 18 years ago. His smoking use included cigarettes. He has a 100.00 pack-year smoking history. He quit  smokeless tobacco use about 20 years ago. He reports that he does not drink alcohol or use drugs.  ROS: All other review of systems were reviewed and are negative except what is noted above in HPI  Physical Exam: BP 112/70   Pulse 86   Temp (!) 96.8 F (36 C)   Ht 5\' 8"  (1.727 m)   Wt 218 lb (98.9 kg)   BMI 33.15 kg/m   Constitutional:  Alert and oriented, No acute distress. HEENT: [place] AT, moist mucus membranes.  Trachea midline, no masses. Cardiovascular: No clubbing, cyanosis, or edema. Respiratory: Normal respiratory effort, no increased work of breathing. GI: Abdomen is soft, nontender, nondistended, no abdominal masses GU: No CVA tenderness. Circumcised phallus. No masses/lesions on penis, testis, scrotum. Prostate 40g smooth no nodules no induration.  Lymph: No cervical or inguinal lymphadenopathy. Skin: No rashes, bruises or suspicious lesions. Neurologic: Grossly  intact, no focal deficits, moving all 4 extremities. Psychiatric: Normal mood and affect.  Laboratory Data: Lab Results  Component Value Date   WBC 8.7 07/18/2018   HGB 14.4 07/18/2018   HCT 41.9 07/18/2018   MCV 104.8 (H) 07/18/2018   PLT 146 (L) 07/18/2018    Lab Results  Component Value Date   CREATININE 0.99 07/18/2018    No results found for: PSA  No results found for: TESTOSTERONE  No results found for: HGBA1C  Urinalysis No results found for: COLORURINE, APPEARANCEUR, LABSPEC, PHURINE, GLUCOSEU, HGBUR, BILIRUBINUR, KETONESUR, PROTEINUR, UROBILINOGEN, NITRITE, LEUKOCYTESUR  No results found for: LABMICR, [place], RBCUA, LABEPIT, MUCUS, BACTERIA  Pertinent Imaging:  No results found for this or any previous visit. No results found for this or any previous visit. No results found for this or any previous visit. No results found for this or any previous visit. No results found for this or any previous visit. No results found for this or any previous visit. No results found for this or any  previous visit. No results found for this or any previous visit.  Assessment & Plan:    1. Urinary hesitancy -We will trial uroxatral 10mg  qhs. If this fails to improve his LUTS we will perform cystoscopy at his next visit  2. Nocturia -Uroxatral 10mg  qhs   No follow-ups on file.  Nicolette Bang, MD  [facility] [place]

## 2019-07-18 NOTE — Progress Notes (Signed)
Urological Symptom Review  Patient is experiencing the following symptoms: Get up at night to urinate Leakage of urine Stream starts and stops Trouble starting stream Weak stream Erection problems (male only)   Review of Systems  Gastrointestinal (upper)  : Negative for upper GI symptoms  Gastrointestinal (lower) : Negative for lower GI symptoms  Constitutional : Negative for symptoms  Skin: Negative for skin symptoms  Eyes: Negative for eye symptoms  Ear/Nose/Throat : Negative for Ear/Nose/Throat symptoms  Hematologic/Lymphatic: Negative for Hematologic/Lymphatic symptoms  Cardiovascular : Negative for cardiovascular symptoms  Respiratory : Negative for respiratory symptoms  Endocrine: Negative for endocrine symptoms  Musculoskeletal: Negative for musculoskeletal symptoms  Neurological: Negative for neurological symptoms  Psychologic: Negative for psychiatric symptoms

## 2019-07-19 ENCOUNTER — Encounter: Payer: Self-pay | Admitting: Urology

## 2019-07-21 NOTE — Addendum Note (Signed)
Addended by: Lavonna Monarch on: 07/21/2019 05:05 PM   Modules accepted: Orders

## 2019-09-07 ENCOUNTER — Ambulatory Visit (INDEPENDENT_AMBULATORY_CARE_PROVIDER_SITE_OTHER): Payer: Medicare HMO | Admitting: Urology

## 2019-09-07 ENCOUNTER — Other Ambulatory Visit: Payer: Self-pay

## 2019-09-07 VITALS — BP 122/84 | HR 74 | Temp 97.0°F | Ht 69.0 in | Wt 205.0 lb

## 2019-09-07 DIAGNOSIS — N138 Other obstructive and reflux uropathy: Secondary | ICD-10-CM

## 2019-09-07 DIAGNOSIS — R3911 Hesitancy of micturition: Secondary | ICD-10-CM

## 2019-09-07 DIAGNOSIS — N401 Enlarged prostate with lower urinary tract symptoms: Secondary | ICD-10-CM | POA: Diagnosis not present

## 2019-09-07 LAB — POCT URINALYSIS DIPSTICK
Bilirubin, UA: NEGATIVE
Blood, UA: NEGATIVE
Glucose, UA: NEGATIVE
Ketones, UA: NEGATIVE
Leukocytes, UA: NEGATIVE
Nitrite, UA: NEGATIVE
Protein, UA: NEGATIVE
Spec Grav, UA: <=1.005 — AB (ref 1.010–1.025)
Urobilinogen, UA: NEGATIVE E.U./dL — AB
pH, UA: 7 (ref 5.0–8.0)

## 2019-09-07 MED ORDER — ALFUZOSIN HCL ER 10 MG PO TB24: 10.0000 mg | ORAL_TABLET | Freq: Every day | ORAL | 3 refills | Status: DC

## 2019-09-07 MED ORDER — CIPROFLOXACIN HCL 500 MG PO TABS: 500.0000 mg | ORAL_TABLET | Freq: Once | ORAL | Status: DC

## 2019-09-07 NOTE — Progress Notes (Signed)
Urological Symptom Review  Patient is experiencing the following symptoms: Get up at night to urinate Leakage of urine Trouble starting stream Weak stream   Review of Systems  Gastrointestinal (upper)  : Negative for upper GI symptoms  Gastrointestinal (lower) : Diarrhea  Constitutional : Negative for symptoms  Skin: Negative for skin symptoms  Eyes: Negative for eye symptoms  Ear/Nose/Throat : Negative for Ear/Nose/Throat symptoms  Hematologic/Lymphatic: Negative for Hematologic/Lymphatic symptoms  Cardiovascular : Negative for cardiovascular symptoms  Respiratory : Negative for respiratory symptoms  Endocrine: Negative for endocrine symptoms  Musculoskeletal: Negative for musculoskeletal symptoms  Neurological: Negative for neurological symptoms  Psychologic: Negative for psychiatric symptoms

## 2019-10-04 ENCOUNTER — Encounter: Payer: Self-pay | Admitting: Urology

## 2019-10-04 ENCOUNTER — Encounter (INDEPENDENT_AMBULATORY_CARE_PROVIDER_SITE_OTHER): Payer: Self-pay

## 2019-10-04 NOTE — Progress Notes (Signed)
09/07/2019 8:36 AM   Juan Quinn 05/13/1930 [id number]  Referring provider: Lucia Gaskins, MD [place],  [place] [address]  Urinary hesitancy  HPI: Mr Juan Quinn is a 86yo here for followup for urinary hesitancy. He was given rx for uroxatral at last visit which he did not fill. He continues to have urinary hesitancy, urinary frequency, and nocturia. Stream is weak. No dysuria or hematuria   PMH: Past Medical History:  Diagnosis Date  . Adenomatous colon polyp 2000   Surveillance due 2017  . Atrial fibrillation ([place])   . Atrial flutter ([place])    a. diagnosed in 12/2017  . BCC (basal cell carcinoma of skin) 04/27/2019   Top Right Shoulder (curet, cautery and 5FU)  . Chronic diarrhea   . Gout   . Heart murmur   . Helicobacter pylori antibody positive 2015   treatment with Prevpac  . History of stomach ulcers   . HTN (hypertension)   . Hypercholesteremia   . 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 Nodular basal cell carcinoma 11/11/2017   Right Post Auricular (treatment after biopsy)  . Nodular basal cell carcinoma 09/28/2018   Right Sideburn (treatment after biopsy)  . Nodular basal cell carcinoma 09/28/2018   Right Front Neck (treatment after biopsy)  . SCC (squamous cell carcinoma) 10/26/2014   Right Ear(in situ) (curet, cautery, and 5FU)  . SCC (squamous cell carcinoma) 10/26/2014   Right Upper Lip(in situ) (LN2 60 second freeze)  . SCC (squamous cell carcinoma) 07/01/2017   Left Sideburn(in situ) (treatment after biopsy)  . SCC (squamous cell carcinoma) 07/01/2017   Right Inner Cheek(in situ) (treatment after biopsy)  . SCC (squamous cell carcinoma) 11/11/2017   Right Inner Cheek Inf.(in situ) (treatment after biopsy)  . SCC (squamous cell carcinoma) 09/28/2018   Right Helix Mid(in situ) (treatment after biopsy)  . SCC (squamous cell carcinoma) 09/28/2018   Right Inner Cheek,Inf(in situ) (treatment after biopsy)  . SCC  (squamous cell carcinoma) 04/27/2019   Tip of right nose(in situ) - CX3+5FU  . Squamous cell carcinoma of skin 10/26/2014   Below Left Ear(in situ) (curet, cautery, and 5FU)  . Superficial basal cell carcinoma 10/26/2014   Top Right Foot (treatment after biopsy)  . Superficial nodular basal cell carcinoma 10/26/2014   Right Forearm (treatment after biopsy)    Surgical History: Past Surgical History:  Procedure Laterality Date  . BIOPSY  10/24/2016   Procedure: BIOPSY;  Surgeon: Juan Dolin, MD;  [facility];  Service: Endoscopy;;  colon  . BIOPSY  01/06/2018   Procedure: BIOPSY;  Surgeon: Juan Houston, MD;  [facility];  Service: Endoscopy;;  duodenal  . COLONOSCOPY  June 2012   Dr. Gala Quinn: adenomatous polyps, due for surveillance if health permits in [id number]  . COLONOSCOPY N/A 10/24/2016   Procedure: COLONOSCOPY;  Surgeon: Juan Dolin, MD;  [facility];  Service: Endoscopy;  Laterality: N/A;  2:00pm  . ESOPHAGOGASTRODUODENOSCOPY N/A 05/24/2013   Dr. Rourk:Prepyloric gastric ulcerations-likely source of hematemesis-likely NSAID-related. H.PYLORI SEROLOGY POSITIVE, TREATED WITH PREVPAC  . ESOPHAGOGASTRODUODENOSCOPY N/A 05/23/2013   [id number] EGD secondary to much clot and food debris in the stomach  . ESOPHAGOGASTRODUODENOSCOPY N/A 08/16/2013   Dr. Gala Quinn: normal esophagus, stomach empty, deformity of antrum. scar present. Previously noted ulcers completed healed. Patent pylorus, normal D1 and D2  . ESOPHAGOGASTRODUODENOSCOPY N/A 01/06/2018   Procedure: ESOPHAGOGASTRODUODENOSCOPY (EGD);  Surgeon: Juan Houston, MD;  [facility];  Service: Endoscopy;  Laterality: N/A;  . HEMORRHOID SURGERY    . POLYPECTOMY  10/24/2016   Procedure: POLYPECTOMY;  Surgeon: Juan Dolin, MD;  [facility];  Service: Endoscopy;;  colon  . shoulder      Home Medications:  Allergies as of 09/07/2019   No Known Allergies       Medication List       Accurate as of September 07, 2019 11:59 PM. If you have any questions, ask your nurse or doctor.        alfuzosin 10 MG 24 hr tablet Commonly known as: UROXATRAL Take 1 tablet (10 mg total) by mouth daily with breakfast.   allopurinol 300 MG tablet Commonly known as: ZYLOPRIM Take 50 mg by mouth daily.   Eliquis 5 MG Tabs tablet Generic drug: apixaban TAKE 1 TABLET BY MOUTH TWICE A DAY   fish oil-omega-3 fatty acids 1000 MG capsule Take 1 g by mouth daily.   ipratropium 0.06 % nasal spray Commonly known as: ATROVENT Place 2 sprays into both nostrils daily as needed for rhinitis.   LACTOBACILLUS PO Take by mouth daily.   metroNIDAZOLE 250 MG tablet Commonly known as: FLAGYL Take 1 tablet (250 mg total) by mouth 3 (three) times daily.   simvastatin 10 MG tablet Commonly known as: ZOCOR Take 10 mg by mouth daily.   simvastatin 20 MG tablet Commonly known as: ZOCOR Take 1 tablet (20 mg total) by mouth at bedtime.   SYSTANE OP Apply 1 drop to eye 4 (four) times daily as needed (dry eyes).   tamsulosin 0.4 MG Caps capsule Commonly known as: FLOMAX Take 0.4 mg by mouth.   terazosin 5 MG capsule Commonly known as: HYTRIN Take 5 mg by mouth daily.   verapamil 120 MG CR tablet Commonly known as: CALAN-SR TAKE 1 TABLET BY MOUTH TWICE A DAY   verapamil 120 MG tablet Commonly known as: CALAN SMARTSIG:By Mouth   vitamin B-12 1000 MCG tablet Commonly known as: CYANOCOBALAMIN Take 3,000 mcg by mouth daily.   Vitamin D3 50 MCG (2000 UT) Tabs Take 3 capsules by mouth daily.       Allergies: No Known Allergies  Family History: Family History  Problem Relation Age of Onset  . Stroke Mother   . Stroke Father   . Pancreatic cancer Sister   . Leukemia Brother   . Lung cancer Sister   . Colon cancer Neg Hx     Social History:  reports that he quit smoking about 19 years ago. His smoking use included cigarettes. He has a 100.00 pack-year  smoking history. He quit smokeless tobacco use about 20 years ago. He reports that he does not drink alcohol and does not use drugs.  ROS: All other review of systems were reviewed and are negative except what is noted above in HPI  Physical Exam: BP 122/84   Pulse 74   Temp (!) 97 F (36.1 C)   Ht 5\' 9"  (1.753 m)   Wt 205 lb (93 kg)   BMI 30.27 kg/m   Constitutional:  Alert and oriented, No acute distress. HEENT:  AT, moist mucus membranes.  Trachea midline, no masses. Cardiovascular: No clubbing, cyanosis, or edema. Respiratory: Normal respiratory effort, no increased work of breathing. GI: Abdomen is soft, nontender, nondistended, no abdominal masses GU: No CVA tenderness.  Lymph: No cervical or inguinal lymphadenopathy. Skin: No rashes, bruises or suspicious lesions. Neurologic: Grossly intact, no focal deficits, moving  all 4 extremities. Psychiatric: Normal mood and affect.  Laboratory Data: Lab Results  Component Value Date   WBC 8.7 07/18/2018   HGB 14.4 07/18/2018   HCT 41.9 07/18/2018   MCV 104.8 (H) 07/18/2018   PLT 146 (L) 07/18/2018    Lab Results  Component Value Date   CREATININE 0.99 07/18/2018    No results found for: PSA  No results found for: TESTOSTERONE  No results found for: HGBA1C  Urinalysis    Component Value Date/Time   BILIRUBINUR NEG 09/07/2019 1109   PROTEINUR Negative 09/07/2019 1109   UROBILINOGEN negative (A) 09/07/2019 1109   NITRITE neg 09/07/2019 1109   LEUKOCYTESUR Negative 09/07/2019 1109    No results found for: LABMICR, [place], RBCUA, LABEPIT, MUCUS, BACTERIA  Pertinent Imaging:  No results found for this or any previous visit.  No results found for this or any previous visit.  No results found for this or any previous visit.  No results found for this or any previous visit.  No results found for this or any previous visit.  No results found for this or any previous visit.  No results found for this or any  previous visit.  No results found for this or any previous visit.   Assessment & Plan:    1. BPH with LUTS, Urinary hesitancy -Uroxatral 10mg  qhs -RTC 3 months - POCT urinalysis dipstick   Return in about 3 months (around 12/08/2019).  Nicolette Bang, MD  [facility] [place]

## 2019-10-04 NOTE — Patient Instructions (Signed)

## 2019-10-10 ENCOUNTER — Telehealth: Payer: Self-pay

## 2019-10-10 NOTE — Telephone Encounter (Signed)
**Note De-Identified Juan Quinn Obfuscation** MD page only of a [place] pt asst application for Eliquis was received Juan Quinn fax from [employer] from [employer] requesting that we complete the application and fax it to [place]. Juan Quinn does ask that we contact him at [phone] if we have any questions.  I have completed the MD page of the [facility] Pt Asst application and have e-mailed it to Juan Quinn nurse so she can obtain his signature and to fax to [facility] at fax number written on the cover letter included.

## 2019-10-11 NOTE — Telephone Encounter (Signed)
Faxed as requested

## 2019-10-13 NOTE — Telephone Encounter (Signed)
**Note De-Identified Juan Quinn Obfuscation** Letter received [place] fax from [employer] stating that they have approved the pt for asst for his Eliquis. Approval is valid until 80/07/3492 Application Case#: [id number]  The letter states that they have notified the pt of this approval as well.

## 2019-11-10 ENCOUNTER — Ambulatory Visit: Payer: Medicare HMO | Attending: Internal Medicine

## 2019-11-10 DIAGNOSIS — Z23 Encounter for immunization: Secondary | ICD-10-CM

## 2019-11-10 NOTE — Progress Notes (Signed)
   Covid-19 Vaccination Clinic  Name:  JABARIE POP    MRN: [id number] DOB: 04/04/1930  11/10/2019  Mr. Starace was observed post Covid-19 immunization for 15 minutes without incident. He was provided with Vaccine Information Sheet and instruction to access the V-Safe system.   Mr. Hazen was instructed to call 911 with any severe reactions post vaccine: Mar[place] Kitchen Difficulty breathing  . Swelling of face and throat  . A fast heartbeat  . A bad rash all over body  . Dizziness and weakness

## 2019-12-08 ENCOUNTER — Ambulatory Visit (INDEPENDENT_AMBULATORY_CARE_PROVIDER_SITE_OTHER): Payer: Medicare HMO | Admitting: Urology

## 2019-12-08 ENCOUNTER — Encounter: Payer: Self-pay | Admitting: Urology

## 2019-12-08 ENCOUNTER — Other Ambulatory Visit: Payer: Self-pay

## 2019-12-08 VITALS — BP 124/67 | HR 72 | Temp 98.2°F | Ht 69.0 in | Wt 205.0 lb

## 2019-12-08 DIAGNOSIS — N401 Enlarged prostate with lower urinary tract symptoms: Secondary | ICD-10-CM | POA: Insufficient documentation

## 2019-12-08 DIAGNOSIS — N138 Other obstructive and reflux uropathy: Secondary | ICD-10-CM

## 2019-12-08 DIAGNOSIS — R3911 Hesitancy of micturition: Secondary | ICD-10-CM | POA: Diagnosis not present

## 2019-12-08 DIAGNOSIS — R351 Nocturia: Secondary | ICD-10-CM | POA: Diagnosis not present

## 2019-12-08 LAB — URINALYSIS, ROUTINE W REFLEX MICROSCOPIC
Bilirubin, UA: NEGATIVE
Glucose, UA: NEGATIVE
Ketones, UA: NEGATIVE
Leukocytes,UA: NEGATIVE
Nitrite, UA: NEGATIVE
Protein,UA: NEGATIVE
RBC, UA: NEGATIVE
Specific Gravity, UA: 1.015 (ref 1.005–1.030)
Urobilinogen, Ur: 0.5 mg/dL (ref 0.2–1.0)
pH, UA: 6.5 (ref 5.0–7.5)

## 2019-12-08 MED ORDER — ALFUZOSIN HCL ER 10 MG PO TB24: 10.0000 mg | ORAL_TABLET | Freq: Every day | ORAL | 3 refills | Status: DC

## 2019-12-08 NOTE — Progress Notes (Signed)
12/08/2019 11:45 AM   Juan Quinn [birth year] [id number]  Referring provider: Lucia Gaskins, MD [place],  [place] [address]  Followup urinary hesitancy and nocturia  HPI: Mr Juan Quinn is a 86yo here for followup for BPH, urinary frequency, and hesitancy.  Nocturia resolved after restarting uroxatral 10mg . Urinary frequency improved to every 4 hours. Hesitancy is rare. Stream strong. No dysuria or hematuria. He is happy with his urination.    PMH: Past Medical History:  Diagnosis Date  . Adenomatous colon polyp 2000   Surveillance due 2017  . Atrial fibrillation ([place])   . Atrial flutter ([place])    a. diagnosed in 12/2017  . BCC (basal cell carcinoma of skin) 04/27/2019   Top Right Shoulder (curet, cautery and 5FU)  . Chronic diarrhea   . Gout   . Heart murmur   . Helicobacter pylori antibody positive 2015   treatment with Prevpac  . History of stomach ulcers   . HTN (hypertension)   . Hypercholesteremia   . 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 Nodular basal cell carcinoma 11/11/2017   Right Post Auricular (treatment after biopsy)  . Nodular basal cell carcinoma 09/28/2018   Right Sideburn (treatment after biopsy)  . Nodular basal cell carcinoma 09/28/2018   Right Front Neck (treatment after biopsy)  . SCC (squamous cell carcinoma) 10/26/2014   Right Ear(in situ) (curet, cautery, and 5FU)  . SCC (squamous cell carcinoma) 10/26/2014   Right Upper Lip(in situ) (LN2 60 second freeze)  . SCC (squamous cell carcinoma) 07/01/2017   Left Sideburn(in situ) (treatment after biopsy)  . SCC (squamous cell carcinoma) 07/01/2017   Right Inner Cheek(in situ) (treatment after biopsy)  . SCC (squamous cell carcinoma) 11/11/2017   Right Inner Cheek Inf.(in situ) (treatment after biopsy)  . SCC (squamous cell carcinoma) 09/28/2018   Right Helix Mid(in situ) (treatment after biopsy)  . SCC (squamous cell carcinoma) 09/28/2018   Right Inner  Cheek,Inf(in situ) (treatment after biopsy)  . SCC (squamous cell carcinoma) 04/27/2019   Tip of right nose(in situ) - CX3+5FU  . Squamous cell carcinoma of skin 10/26/2014   Below Left Ear(in situ) (curet, cautery, and 5FU)  . Superficial basal cell carcinoma 10/26/2014   Top Right Foot (treatment after biopsy)  . Superficial nodular basal cell carcinoma 10/26/2014   Right Forearm (treatment after biopsy)    Surgical History: Past Surgical History:  Procedure Laterality Date  . BIOPSY  10/24/2016   Procedure: BIOPSY;  Surgeon: Daneil Dolin, MD;  [facility];  Service: Endoscopy;;  colon  . BIOPSY  01/06/2018   Procedure: BIOPSY;  Surgeon: Rogene Houston, MD;  [facility];  Service: Endoscopy;;  duodenal  . COLONOSCOPY  June 2012   Dr. Gala Romney: adenomatous polyps, due for surveillance if health permits in 7124  . COLONOSCOPY N/A 10/24/2016   Procedure: COLONOSCOPY;  Surgeon: Daneil Dolin, MD;  [facility];  Service: Endoscopy;  Laterality: N/A;  2:00pm  . ESOPHAGOGASTRODUODENOSCOPY N/A 05/24/2013   Dr. Rourk:Prepyloric gastric ulcerations-likely source of hematemesis-likely NSAID-related. H.PYLORI SEROLOGY POSITIVE, TREATED WITH PREVPAC  . ESOPHAGOGASTRODUODENOSCOPY N/A 05/23/2013   PYK:DXIPJASNKN EGD secondary to much clot and food debris in the stomach  . ESOPHAGOGASTRODUODENOSCOPY N/A 08/16/2013   Dr. Gala Romney: normal esophagus, stomach empty, deformity of antrum. scar present. Previously noted ulcers completed healed. Patent pylorus, normal D1 and D2  . ESOPHAGOGASTRODUODENOSCOPY N/A 01/06/2018   Procedure: ESOPHAGOGASTRODUODENOSCOPY (EGD);  Surgeon:  Rogene Houston, MD;  [facility];  Service: Endoscopy;  Laterality: N/A;  . HEMORRHOID SURGERY    . POLYPECTOMY  10/24/2016   Procedure: POLYPECTOMY;  Surgeon: Daneil Dolin, MD;  [facility];  Service: Endoscopy;;  colon  . shoulder      Home Medications:  Allergies as  of 12/08/2019   No Known Allergies     Medication List       Accurate as of December 08, 2019 11:45 AM. If you have any questions, ask your nurse or doctor.        STOP taking these medications   metroNIDAZOLE 250 MG tablet Commonly known as: FLAGYL Stopped by: Nicolette Bang, MD     TAKE these medications   alfuzosin 10 MG 24 hr tablet Commonly known as: UROXATRAL Take 1 tablet (10 mg total) by mouth daily with breakfast.   allopurinol 300 MG tablet Commonly known as: ZYLOPRIM Take 50 mg by mouth daily.   Eliquis 5 MG Tabs tablet Generic drug: apixaban TAKE 1 TABLET BY MOUTH TWICE A DAY   fish oil-omega-3 fatty acids 1000 MG capsule Take 1 g by mouth daily.   ipratropium 0.06 % nasal spray Commonly known as: ATROVENT Place 2 sprays into both nostrils daily as needed for rhinitis.   LACTOBACILLUS PO Take by mouth daily.   simvastatin 10 MG tablet Commonly known as: ZOCOR Take 10 mg by mouth daily. What changed: Another medication with the same name was removed. Continue taking this medication, and follow the directions you see here. Changed by: Nicolette Bang, MD   SYSTANE OP Apply 1 drop to eye 4 (four) times daily as needed (dry eyes).   tamsulosin 0.4 MG Caps capsule Commonly known as: FLOMAX Take 0.4 mg by mouth.   terazosin 5 MG capsule Commonly known as: HYTRIN Take 5 mg by mouth daily.   verapamil 120 MG CR tablet Commonly known as: CALAN-SR TAKE 1 TABLET BY MOUTH TWICE A DAY   verapamil 120 MG tablet Commonly known as: CALAN SMARTSIG:By Mouth   vitamin B-12 1000 MCG tablet Commonly known as: CYANOCOBALAMIN Take 3,000 mcg by mouth daily.   Vitamin D3 50 MCG (2000 UT) Tabs Take 3 capsules by mouth daily.       Allergies: No Known Allergies  Family History: Family History  Problem Relation Age of Onset  . Stroke Mother   . Stroke Father   . Pancreatic cancer Sister   . Leukemia Brother   . Lung cancer Sister   . Colon cancer  Neg Hx     Social History:  reports that he quit smoking about 19 years ago. His smoking use included cigarettes. He has a 100.00 pack-year smoking history. He quit smokeless tobacco use about 20 years ago. He reports that he does not drink alcohol and does not use drugs.  ROS: All other review of systems were reviewed and are negative except what is noted above in HPI  Physical Exam: BP 124/67   Pulse 72   Temp 98.2 F (36.8 C)   Ht 5\' 9"  (1.753 m)   Wt 205 lb (93 kg)   BMI 30.27 kg/m   Constitutional:  Alert and oriented, No acute distress. HEENT:  AT, moist mucus membranes.  Trachea midline, no masses. Cardiovascular: No clubbing, cyanosis, or edema. Respiratory: Normal respiratory effort, no increased work of breathing. GI: Abdomen is soft, nontender, nondistended, no abdominal masses GU: No CVA tenderness.  Lymph: No cervical or inguinal  lymphadenopathy. Skin: No rashes, bruises or suspicious lesions. Neurologic: Grossly intact, no focal deficits, moving all 4 extremities. Psychiatric: Normal mood and affect.  Laboratory Data: Lab Results  Component Value Date   WBC 8.7 07/18/2018   HGB 14.4 07/18/2018   HCT 41.9 07/18/2018   MCV 104.8 (H) 07/18/2018   PLT 146 (L) 07/18/2018    Lab Results  Component Value Date   CREATININE 0.99 07/18/2018    No results found for: PSA  No results found for: TESTOSTERONE  No results found for: HGBA1C  Urinalysis    Component Value Date/Time   BILIRUBINUR NEG 09/07/2019 1109   PROTEINUR Negative 09/07/2019 1109   UROBILINOGEN negative (A) 09/07/2019 1109   NITRITE neg 09/07/2019 1109   LEUKOCYTESUR Negative 09/07/2019 1109    No results found for: LABMICR, [place], RBCUA, LABEPIT, MUCUS, BACTERIA  Pertinent Imaging:  No results found for this or any previous visit.  No results found for this or any previous visit.  No results found for this or any previous visit.  No results found for this or any previous  visit.  No results found for this or any previous visit.  No results found for this or any previous visit.  No results found for this or any previous visit.  No results found for this or any previous visit.   Assessment & Plan:    1. Benign prostatic hyperplasia with urinary obstruction -continue uroxatral 10mg  - Urinalysis, Routine w reflex microscopic  2. Urinary hesitancy -continue uroxatral 10mg   3. Nocturia -continue uroxatral 10mg    No follow-ups on file.  Nicolette Bang, MD  [facility] [place]

## 2019-12-08 NOTE — Progress Notes (Signed)
Urological Symptom Review  Patient is experiencing the following symptoms: Get up at night to urinate Stream starts and stops Trouble starting stream   Review of Systems  Gastrointestinal (upper)  : Negative for upper GI symptoms  Gastrointestinal (lower) : Negative for lower GI symptoms  Constitutional : Negative for symptoms  Skin: Negative for skin symptoms  Eyes: Negative for eye symptoms  Ear/Nose/Throat : Negative for Ear/Nose/Throat symptoms  Hematologic/Lymphatic: Negative for Hematologic/Lymphatic symptoms  Cardiovascular : Negative for cardiovascular symptoms  Respiratory : Negative for respiratory symptoms  Endocrine: Negative for endocrine symptoms  Musculoskeletal: Negative for musculoskeletal symptoms  Neurological: Negative for neurological symptoms  Psychologic: Negative for psychiatric symptoms 

## 2019-12-08 NOTE — Patient Instructions (Signed)

## 2019-12-13 ENCOUNTER — Encounter (INDEPENDENT_AMBULATORY_CARE_PROVIDER_SITE_OTHER): Payer: Self-pay | Admitting: Internal Medicine

## 2019-12-13 ENCOUNTER — Ambulatory Visit (INDEPENDENT_AMBULATORY_CARE_PROVIDER_SITE_OTHER): Payer: Medicare HMO | Admitting: Internal Medicine

## 2019-12-13 ENCOUNTER — Other Ambulatory Visit: Payer: Self-pay

## 2019-12-13 DIAGNOSIS — Z87898 Personal history of other specified conditions: Secondary | ICD-10-CM | POA: Diagnosis not present

## 2019-12-13 NOTE — Progress Notes (Signed)
Presenting complaint;  Follow-up for chronic diarrhea.  Database and subjective:  Patient is 86 year old Caucasian male who has chronic diarrhea and is here for scheduled visit.  He was last seen 6 months ago.  He is accompanied by his wife.  Diarrhea started few years ago when he underwent extensive work-up locally as well as at [facility] and at one point felt to be due to small intestinal bacterial overgrowth and did respond to metronidazole first-line but not subsequently.  His diarrhea improved when he was begun on calcium channel blocker for SVT.  He states he is doing well.  He generally has 1 bowel movement daily or every other day.  He describes his stool to be soft long and skinny.  He does not experience cramping melena or rectal bleeding.  His appetite is good.  He is watching calorie intake as he is trying to lose weight.  He has lost 8 pounds.  At the peak of his diarrhea his weight was below 200 pounds.  He does noted flatulence towards the end of the day.  He remains very active.  He goes to gym 3 times of a week.  He takes Tylenol no more than 1 g/day. He states about 2 weeks ago he had a presyncopal episode while he was at gym.  He says he just went down.  He does not recall that he passed out.  What he does remember is to him and helped him get back on his feet.  He does not remember having chest pain or palpitations or diaphoresis.  Current Medications: Outpatient Encounter Medications as of 12/13/2019  Medication Sig  . acetaminophen (TYLENOL) 500 MG tablet Take 500 mg by mouth. Patient takes 2 by mouth at bedtime for restless leg.  . alfuzosin (UROXATRAL) 10 MG 24 hr tablet Take 1 tablet (10 mg total) by mouth daily with breakfast.  . allopurinol (ZYLOPRIM) 300 MG tablet Take by mouth daily.   . Cholecalciferol (VITAMIN D3) 2000 units TABS Take 3 capsules by mouth daily.   Marland Kitchen ELIQUIS 5 MG TABS tablet TAKE 1 TABLET BY MOUTH TWICE A DAY  . ipratropium (ATROVENT) 0.06 % nasal spray  Place 2 sprays into both nostrils daily as needed for rhinitis.   Marland Kitchen LACTOBACILLUS PO Take by mouth daily.  Vladimir Faster Glycol-Propyl Glycol (SYSTANE OP) Apply 1 drop to eye 4 (four) times daily as needed (dry eyes).  . simvastatin (ZOCOR) 10 MG tablet Take 10 mg by mouth daily.  . verapamil (CALAN-SR) 120 MG CR tablet TAKE 1 TABLET BY MOUTH TWICE A DAY  . vitamin B-12 (CYANOCOBALAMIN) 1000 MCG tablet Take 3,000 mcg by mouth daily.   . [DISCONTINUED] fish oil-omega-3 fatty acids 1000 MG capsule Take 1 g by mouth daily.   (Patient not taking: Reported on 12/13/2019)  . [DISCONTINUED] tamsulosin (FLOMAX) 0.4 MG CAPS capsule Take 0.4 mg by mouth. (Patient not taking: Reported on 09/07/2019)  . [DISCONTINUED] terazosin (HYTRIN) 5 MG capsule Take 5 mg by mouth daily. (Patient not taking: Reported on 09/07/2019)  . [DISCONTINUED] verapamil (CALAN) 120 MG tablet SMARTSIG:By Mouth (Patient not taking: No sig reported)   Facility-Administered Encounter Medications as of 12/13/2019  Medication  . triamcinolone acetonide (KENALOG-40) injection 40 mg    Objective: Blood pressure 119/65, pulse 73, temperature 98.6 F (37 C), temperature source Oral, height 5\' 9"  (1.753 m), weight 210 lb 8 oz (95.5 kg). Patient is alert and in no acute distress. He has hearing impairment. He is wearing a mask.  Conjunctiva is pink. Sclera is nonicteric Oropharyngeal mucosa is normal. He has upper lower dentures in place. No neck masses or thyromegaly noted. Cardiac exam with irregular rhythm normal S1 and S2.  He has soft systolic murmur at aortic area. Lungs are clear to auscultation. Abdomen is full.  He has small umbilical hernia.  On palpation abdomen is soft and nontender with organomegaly or masses. No LE edema or clubbing noted.  Assessment:  #1.  Chronic diarrhea with associated with weight loss felt to be due to small intestinal bacterial overgrowth.  He has undergone extensive work-up.  He did initially  respond to metronidazole and maybe 2 other antibiotics as well.  He has done much better since he has been on verapamil.  Weight loss appears to be voluntary.  He is having close to normal bowel movements.  Will not pursue with further work-up as long as he is doing well.  #2.  Recent presyncopal episode.  Patient's heart rate is within normal limits and so is his blood pressure.  I would like for him to follow-up with his cardiologist.   Plan:  Patient advised to take Metamucil 2 to 4 g by mouth daily at bedtime. Patient advised to contact Dr. Tanna Furry office regarding presyncopal episode. Patient will call if there is change in his bowel habits. Office visit in 1 year.

## 2019-12-13 NOTE — Patient Instructions (Addendum)
Please contact Dr. Tanna Furry office to bring him up-to-date on your presyncopal episode.  He may recommend further evaluation. Metamucil or equivalent 2 to 4 g by mouth daily at bedtime. Please notify if flatulence becomes intractable

## 2019-12-14 ENCOUNTER — Telehealth: Payer: Self-pay | Admitting: Internal Medicine

## 2019-12-14 NOTE — Telephone Encounter (Signed)
Patient was at the [facility] last Wednesday. He got off a machine going to another machine and became dizzy and passed out. States no other symptoms. Went to see his [facility] doctor yesterday and was told to [employer] school teacher. Please call [phone].

## 2019-12-15 ENCOUNTER — Ambulatory Visit: Payer: Medicare HMO | Admitting: Student

## 2019-12-15 ENCOUNTER — Other Ambulatory Visit: Payer: Self-pay

## 2019-12-15 ENCOUNTER — Encounter: Payer: Self-pay | Admitting: Student

## 2019-12-15 VITALS — BP 130/66 | HR 70 | Ht 69.0 in | Wt 210.4 lb

## 2019-12-15 DIAGNOSIS — R55 Syncope and collapse: Secondary | ICD-10-CM | POA: Diagnosis not present

## 2019-12-15 DIAGNOSIS — Z79899 Other long term (current) drug therapy: Secondary | ICD-10-CM

## 2019-12-15 DIAGNOSIS — I4892 Unspecified atrial flutter: Secondary | ICD-10-CM | POA: Diagnosis not present

## 2019-12-15 DIAGNOSIS — E782 Mixed hyperlipidemia: Secondary | ICD-10-CM

## 2019-12-15 DIAGNOSIS — I10 Essential (primary) hypertension: Secondary | ICD-10-CM | POA: Diagnosis not present

## 2019-12-15 NOTE — Telephone Encounter (Signed)
Spoke with pt and wife at time of episode pt did not have SOB or chest pain. Current BP is 135/75 HR 59.

## 2019-12-15 NOTE — Patient Instructions (Signed)
Medication Instructions:  Your physician recommends that you continue on your current medications as directed. Please refer to the Current Medication list given to you today.  *If you need a refill on your cardiac medications before your next appointment, please call your pharmacy*   Lab Work: CBC,BMET,Magnesium  If you have labs (blood work) drawn today and your tests are completely normal, you will receive your results only by: Marland Kitchen MyChart Message (if you have MyChart) OR . A paper copy in the mail If you have any lab test that is abnormal or we need to change your treatment, we will call you to review the results.   Testing/Procedures: None today   Follow-Up: At [facility], you and your health needs are our priority.  As part of our continuing mission to provide you with exceptional heart care, we have created designated Provider Care Teams.  These Care Teams include your primary Cardiologist (physician) and Advanced Practice Providers (APPs -  Physician Assistants and Nurse Practitioners) who all work together to provide you with the care you need, when you need it.  We recommend signing up for the patient portal called "MyChart".  Sign up information is provided on this After Visit Summary.  MyChart is used to connect with patients for Virtual Visits (Telemedicine).  Patients are able to view lab/test results, encounter notes, upcoming appointments, etc.  Non-urgent messages can be sent to your provider as well.   To learn more about what you can do with MyChart, go to [email or web address].    Your next appointment:   6 month(s)  The format for your next appointment:   In Person  Provider:   Cristopher Peru, MD   Other Instructions None     Thank you for choosing [facility] !

## 2019-12-15 NOTE — Telephone Encounter (Signed)
Apt open today at 230 pm with APP, wife accepts apt

## 2019-12-15 NOTE — Progress Notes (Signed)
Cardiology Office Note    Date:  12/16/2019   ID:  Juan Quinn, DOB June 05, 1930, MRN [id number]  PCP:  Lucia Gaskins, MD  Cardiologist: Kate Sable, MD (Inactive)   EP: Dr. Lovena Le  Chief Complaint  Patient presents with  . Follow-up    recent syncopal episode    History of Present Illness:    Juan Quinn is a 86 y.o. adult with past medical history of paroxysmal atrial fibrillation/flutter, HTN and HLD who presents to the office today for evaluation of a syncopal episode.  He was last examined Dr. Lovena Le in 02/2019 and reported his palpitations had improved at that time. He was taking Verapamil 120 mg twice daily and was continued on this along with Eliquis for anticoagulation.  The patient called the office on 12/14/2019 reporting a syncopal episode the prior week and follow-up was arranged for further evaluation.  In taking with the patient and his wife today, he reports having a syncopal episode while at the [facility]. Says he had been working out and was walking to a different machine when he developed brief dizziness and passed out. Was only out for a few seconds per his report. No associated chest pain or palpitations. He felt back to baseline afterwards and checked his vitals once home and says everything was stable. He has been going to the gym daily since along with doing his routine activities at home and denies any recurrent episodes since.   His wife mentions he only drinks coffee in the morning, approximately 2-3 cups, and consumes minimal water prior to working out.   He remains on Eliquis for anticoagulation and denies any melena, hematochezia or hematuria. Says his diarrhea significantly improved after being started on Verapamil.   Past Medical History:  Diagnosis Date  . Adenomatous colon polyp 2000   Surveillance due 2017  . Atrial fibrillation ([place])   . Atrial flutter ([place])    a. diagnosed in 12/2017  . BCC (basal cell carcinoma of skin) 04/27/2019    Top Right Shoulder (curet, cautery and 5FU)  . Chronic diarrhea   . Gout   . Heart murmur   . Helicobacter pylori antibody positive 2015   treatment with Prevpac  . History of stomach ulcers   . HTN (hypertension)   . Hypercholesteremia   . 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 Nodular basal cell carcinoma 11/11/2017   Right Post Auricular (treatment after biopsy)  . Nodular basal cell carcinoma 09/28/2018   Right Sideburn (treatment after biopsy)  . Nodular basal cell carcinoma 09/28/2018   Right Front Neck (treatment after biopsy)  . SCC (squamous cell carcinoma) 10/26/2014   Right Ear(in situ) (curet, cautery, and 5FU)  . SCC (squamous cell carcinoma) 10/26/2014   Right Upper Lip(in situ) (LN2 60 second freeze)  . SCC (squamous cell carcinoma) 07/01/2017   Left Sideburn(in situ) (treatment after biopsy)  . SCC (squamous cell carcinoma) 07/01/2017   Right Inner Cheek(in situ) (treatment after biopsy)  . SCC (squamous cell carcinoma) 11/11/2017   Right Inner Cheek Inf.(in situ) (treatment after biopsy)  . SCC (squamous cell carcinoma) 09/28/2018   Right Helix Mid(in situ) (treatment after biopsy)  . SCC (squamous cell carcinoma) 09/28/2018   Right Inner Cheek,Inf(in situ) (treatment after biopsy)  . SCC (squamous cell carcinoma) 04/27/2019   Tip of right nose(in situ) - CX3+5FU  . Squamous cell carcinoma of skin 10/26/2014   Below Left Ear(in situ) (curet, cautery, and 5FU)  .  Superficial basal cell carcinoma 10/26/2014   Top Right Foot (treatment after biopsy)  . Superficial nodular basal cell carcinoma 10/26/2014   Right Forearm (treatment after biopsy)    Past Surgical History:  Procedure Laterality Date  . BIOPSY  10/24/2016   Procedure: BIOPSY;  Surgeon: Daneil Dolin, MD;  [facility];  Service: Endoscopy;;  colon  . BIOPSY  01/06/2018   Procedure: BIOPSY;  Surgeon: Rogene Houston, MD;  [facility];  Service:  Endoscopy;;  duodenal  . COLONOSCOPY  June 2012   Dr. Gala Romney: adenomatous polyps, due for surveillance if health permits in 4431  . COLONOSCOPY N/A 10/24/2016   Procedure: COLONOSCOPY;  Surgeon: Daneil Dolin, MD;  [facility];  Service: Endoscopy;  Laterality: N/A;  2:00pm  . ESOPHAGOGASTRODUODENOSCOPY N/A 05/24/2013   Dr. Rourk:Prepyloric gastric ulcerations-likely source of hematemesis-likely NSAID-related. H.PYLORI SEROLOGY POSITIVE, TREATED WITH PREVPAC  . ESOPHAGOGASTRODUODENOSCOPY N/A 05/23/2013   VQM:[id number] EGD secondary to much clot and food debris in the stomach  . ESOPHAGOGASTRODUODENOSCOPY N/A 08/16/2013   Dr. Gala Romney: normal esophagus, stomach empty, deformity of antrum. scar present. Previously noted ulcers completed healed. Patent pylorus, normal D1 and D2  . ESOPHAGOGASTRODUODENOSCOPY N/A 01/06/2018   Procedure: ESOPHAGOGASTRODUODENOSCOPY (EGD);  Surgeon: Rogene Houston, MD;  [facility];  Service: Endoscopy;  Laterality: N/A;  . HEMORRHOID SURGERY    . POLYPECTOMY  10/24/2016   Procedure: POLYPECTOMY;  Surgeon: Daneil Dolin, MD;  [facility];  Service: Endoscopy;;  colon  . shoulder      Current Medications: Outpatient Medications Prior to Visit  Medication Sig Dispense Refill  . acetaminophen (TYLENOL) 500 MG tablet Take 500 mg by mouth. Patient takes 2 by mouth at bedtime for restless leg.    . alfuzosin (UROXATRAL) 10 MG 24 hr tablet Take 1 tablet (10 mg total) by mouth daily with breakfast. 90 tablet 3  . allopurinol (ZYLOPRIM) 300 MG tablet Take 150 mg by mouth daily.     . Cholecalciferol (VITAMIN D3) 2000 units TABS Take 3 capsules by mouth daily.     Marland Kitchen ELIQUIS 5 MG TABS tablet TAKE 1 TABLET BY MOUTH TWICE A DAY 60 tablet 6  . ipratropium (ATROVENT) 0.06 % nasal spray Place 2 sprays into both nostrils daily as needed for rhinitis.     Marland Kitchen LACTOBACILLUS PO Take by mouth daily.    Vladimir Faster Glycol-Propyl Glycol (SYSTANE OP)  Apply 1 drop to eye 4 (four) times daily as needed (dry eyes).    . simvastatin (ZOCOR) 10 MG tablet Take 10 mg by mouth daily.    . verapamil (CALAN-SR) 120 MG CR tablet TAKE 1 TABLET BY MOUTH TWICE A DAY 180 tablet 3  . vitamin B-12 (CYANOCOBALAMIN) 1000 MCG tablet Take 3,000 mcg by mouth daily.      Facility-Administered Medications Prior to Visit  Medication Dose Route Frequency Provider Last Rate Last Admin  . triamcinolone acetonide (KENALOG-40) injection 40 mg  40 mg Intramuscular Once Lavonna Monarch, MD         Allergies:   Patient has no known allergies.   Social History   Socioeconomic History  . Marital status: Married    Spouse name: Not on file  . Number of children: 4  . Years of education: Not on file  . Highest education level: Not on file  Occupational History  . Occupation: retired    Comment: Press photographer  Tobacco Use  . Smoking status: Former  Smoker    Packs/day: 2.00    Years: 50.00    Pack years: 100.00    Types: Cigarettes    Quit date: 07/28/2000    Years since quitting: 19.3  . Smokeless tobacco: Former Systems developer    Quit date: 02/25/1999  Vaping Use  . Vaping Use: Never used  Substance and Sexual Activity  . Alcohol use: No    Comment: couple glasses wine/week, occ beer or rmixed drink  . Drug use: No    Frequency: 2.0 times per week  . Sexual activity: Not on file  Other Topics Concern  . Not on file  Social History Narrative   Lives w/ wife         Social Determinants of Health   Financial Resource Strain:   . Difficulty of Paying Living Expenses: Not on file  Food Insecurity:   . Worried About Charity fundraiser in the Last Year: Not on file  . Ran Out of Food in the Last Year: Not on file  Transportation Needs:   . Lack of Transportation (Medical): Not on file  . Lack of Transportation (Non-Medical): Not on file  Physical Activity:   . Days of Exercise per Week: Not on file  . Minutes of Exercise per Session: Not on file  Stress:   . Feeling  of Stress : Not on file  Social Connections:   . Frequency of Communication with Friends and Family: Not on file  . Frequency of Social Gatherings with Friends and Family: Not on file  . Attends Religious Services: Not on file  . Active Member of Clubs or Organizations: Not on file  . Attends Archivist Meetings: Not on file  . Marital Status: Not on file     Family History:  The patient's family history includes Leukemia in his brother; Lung cancer in his sister; Pancreatic cancer in his sister; Stroke in his father and mother.   Review of Systems:   Please see the history of present illness.     General:  No chills, fever, night sweats or weight changes. Positive for syncopal episode.  Cardiovascular:  No chest pain, dyspnea on exertion, edema, orthopnea, palpitations, paroxysmal nocturnal dyspnea. Dermatological: No rash, lesions/masses Respiratory: No cough, dyspnea Urologic: No hematuria, dysuria Abdominal:   No nausea, vomiting, diarrhea, bright red blood per rectum, melena, or hematemesis Neurologic:  No visual changes, wkns, changes in mental status. All other systems reviewed and are otherwise negative except as noted above.   Physical Exam:    VS:  BP 130/66   Pulse 70   Ht 5\' 9"  (1.753 m)   Wt 210 lb 6.4 oz (95.4 kg)   SpO2 97%   BMI 31.07 kg/m    General: Well developed, elderly male appearing in no acute distress. Head: Normocephalic, atraumatic. Neck: No carotid bruits. JVD not elevated.  Lungs: Respirations regular and unlabored, without wheezes or rales.  Heart: Regular rate and rhythm. No S3 or S4.  No murmur, no rubs, or gallops appreciated. Abdomen: Appears non-distended. No obvious abdominal masses. Msk:  Strength and tone appear normal for age. No obvious joint deformities or effusions. Extremities: No clubbing or cyanosis. No lower extremity edema.  Distal pedal pulses are 2+ bilaterally. Neuro: Alert and oriented X 3. Moves all extremities  spontaneously. No focal deficits noted. Psych:  Responds to questions appropriately with a normal affect. Skin: No rashes or lesions noted  Wt Readings from Last 3 Encounters:  12/15/19 210 lb 6.4  oz (95.4 kg)  12/13/19 210 lb 8 oz (95.5 kg)  12/08/19 205 lb (93 kg)     Studies/Labs Reviewed:   EKG:  EKG is ordered today.  The ekg ordered today demonstrates NSR, HR 68 with known RBBB and LAFB. No acute changes when compared to prior tracings.   Recent Labs: 12/15/2019: BUN 15; Creat 1.09; Hemoglobin 13.8; Magnesium 2.1; Platelets 142; Potassium 4.4; Sodium 141   Lipid Panel    Component Value Date/Time   CHOL 192 10/21/2018 0948   TRIG 148 10/21/2018 0948   HDL 55 10/21/2018 0948   CHOLHDL 3.5 10/21/2018 0948   LDLCALC 111 (H) 10/21/2018 0948    Additional studies/ records that were reviewed today include:   Echocardiogram: 12/2017 Study Conclusions   - Left ventricle: The cavity size was normal. Wall thickness was  increased in a pattern of moderate LVH. Systolic function was  vigorous. The estimated ejection fraction was in the range of 65%  to 70%. Wall motion was normal; there were no regional wall  motion abnormalities. The study is not technically sufficient to  allow evaluation of LV diastolic function.  - Aortic valve: Mildly calcified annulus. Trileaflet; mildly  thickened leaflets. Valve area (VTI): 3.46 cm^2. Valve area  (Vmax): 3.29 cm^2.  - Mitral valve: Mildly calcified annulus. Mildly thickened leaflets  Event Monitor: 07/2018  Sinus rhythm, sinus tachycardia, PAC's, rare PVC's, and atrial fibrillation and flutter were seen. Episodes fo RVR noted. No significant pauses.   Assessment:    1. Atrial flutter, paroxysmal ([place])   2. Syncope, unspecified syncope type   3. Medication management   4. Essential hypertension   5. Mixed hyperlipidemia      Plan:   In order of problems listed above:  1. Paroxysmal Atrial Fibrillation - He  reports minimal palpitations at home and says his HR has been in the 60's to 80's when checked at home. He remains on Verapamil 120mg  BID.  - He denies any evidence of active bleeding. On Eliquis 5mg  BID for anticoagulation. Will obtain repeat labs to include CBC and BMET.   2. Syncopal Episode - Unclear etiology as he developed brief dizziness then lost consciousness for a few seconds. No associated chest pain, palpitations, or dyspnea and vitals were stable following his episode. We discussed possibly reducing his Verapamil to once daily dosing but he does not wish to undergo medication changes at this time given his improvement in palpitations and diarrhea since being on the medication. His EKG today does show his known RBBB and LAFB but this has been noted on prior tracings as well. We reviewed placing a repeat monitor to assess for episodes of high-grade block or pauses but he wishes to hold off for now. Was encouraged to make [place] aware if recurrent dizziness or syncopal episodes. Also possible his episode was due to dehydration given he only consumes coffee prior to working out. Recommended increasing his water intake. Will recheck CBC, BMET and Mg today.   3. HTN - BP is well-controlled at 130/66 during today's visit. Continue current medication regimen.   4. HLD - Followed by PCP. He remains on Simvastatin 10mg  daily. Previously not interested in switching to an alternative given the concurrent use of Verapamil.    Medication Adjustments/Labs and Tests Ordered: Current medicines are reviewed at length with the patient today.  Concerns regarding medicines are outlined above.  Medication changes, Labs and Tests ordered today are listed in the Patient Instructions below. Patient Instructions  Medication  Instructions:  Your physician recommends that you continue on your current medications as directed. Please refer to the Current Medication list given to you today.  *If you need a refill on  your cardiac medications before your next appointment, please call your pharmacy*   Lab Work: CBC,BMET,Magnesium  If you have labs (blood work) drawn today and your tests are completely normal, you will receive your results only by: Marland Kitchen MyChart Message (if you have MyChart) OR . A paper copy in the mail If you have any lab test that is abnormal or we need to change your treatment, we will call you to review the results.   Testing/Procedures: None today   Follow-Up: At [facility], you and your health needs are our priority.  As part of our continuing mission to provide you with exceptional heart care, we have created designated Provider Care Teams.  These Care Teams include your primary Cardiologist (physician) and Advanced Practice Providers (APPs -  Physician Assistants and Nurse Practitioners) who all work together to provide you with the care you need, when you need it.  We recommend signing up for the patient portal called "MyChart".  Sign up information is provided on this After Visit Summary.  MyChart is used to connect with patients for Virtual Visits (Telemedicine).  Patients are able to view lab/test results, encounter notes, upcoming appointments, etc.  Non-urgent messages can be sent to your provider as well.   To learn more about what you can do with MyChart, go to [email or web address].    Your next appointment:   6 month(s)  The format for your next appointment:   In Person  Provider:   Cristopher Peru, MD   Other Instructions None     Thank you for choosing [facility] !            Signed, Erma Heritage, PA-C  12/16/2019 10:16 AM    [facility] [address] [place], [place] [address] Phone: [phone] Fax: [phone]

## 2019-12-16 ENCOUNTER — Encounter: Payer: Self-pay | Admitting: Student

## 2019-12-16 LAB — MAGNESIUM: Magnesium: 2.1 mg/dL (ref 1.5–2.5)

## 2019-12-16 LAB — BASIC METABOLIC PANEL
BUN: 15 mg/dL (ref 7–25)
CO2: 30 mmol/L (ref 20–32)
Calcium: 9.4 mg/dL (ref 8.6–10.3)
Chloride: 106 mmol/L (ref 98–110)
Creat: 1.09 mg/dL (ref 0.70–1.11)
Glucose, Bld: 104 mg/dL — ABNORMAL HIGH (ref 65–99)
Potassium: 4.4 mmol/L (ref 3.5–5.3)
Sodium: 141 mmol/L (ref 135–146)

## 2019-12-16 LAB — CBC
HCT: 40.1 % (ref 38.5–50.0)
Hemoglobin: 13.8 g/dL (ref 13.2–17.1)
MCH: 36.3 pg — ABNORMAL HIGH (ref 27.0–33.0)
MCHC: 34.4 g/dL (ref 32.0–36.0)
MCV: 105.5 fL — ABNORMAL HIGH (ref 80.0–100.0)
MPV: 9.8 fL (ref 7.5–12.5)
Platelets: 142 10*3/uL (ref 140–400)
RBC: 3.8 10*6/uL — ABNORMAL LOW (ref 4.20–5.80)
RDW: 13 % (ref 11.0–15.0)
WBC: 6.6 10*3/uL (ref 3.8–10.8)

## 2019-12-17 ENCOUNTER — Other Ambulatory Visit: Payer: Self-pay | Admitting: Internal Medicine

## 2019-12-20 ENCOUNTER — Other Ambulatory Visit: Payer: Self-pay | Admitting: *Deleted

## 2019-12-20 MED ORDER — SIMVASTATIN 10 MG PO TABS: 10.0000 mg | ORAL_TABLET | Freq: Every day | ORAL | 3 refills | Status: AC

## 2020-03-01 ENCOUNTER — Ambulatory Visit: Payer: Medicare HMO | Admitting: Dermatology

## 2020-03-21 IMAGING — DX CHEST - 2 VIEW
2 series · 2 of 2 positions shown · non-contrast
Comparison: 07/02/2018

CLINICAL DATA: Not feeling well.

EXAM:
CHEST - 2 VIEW

[chest pa]
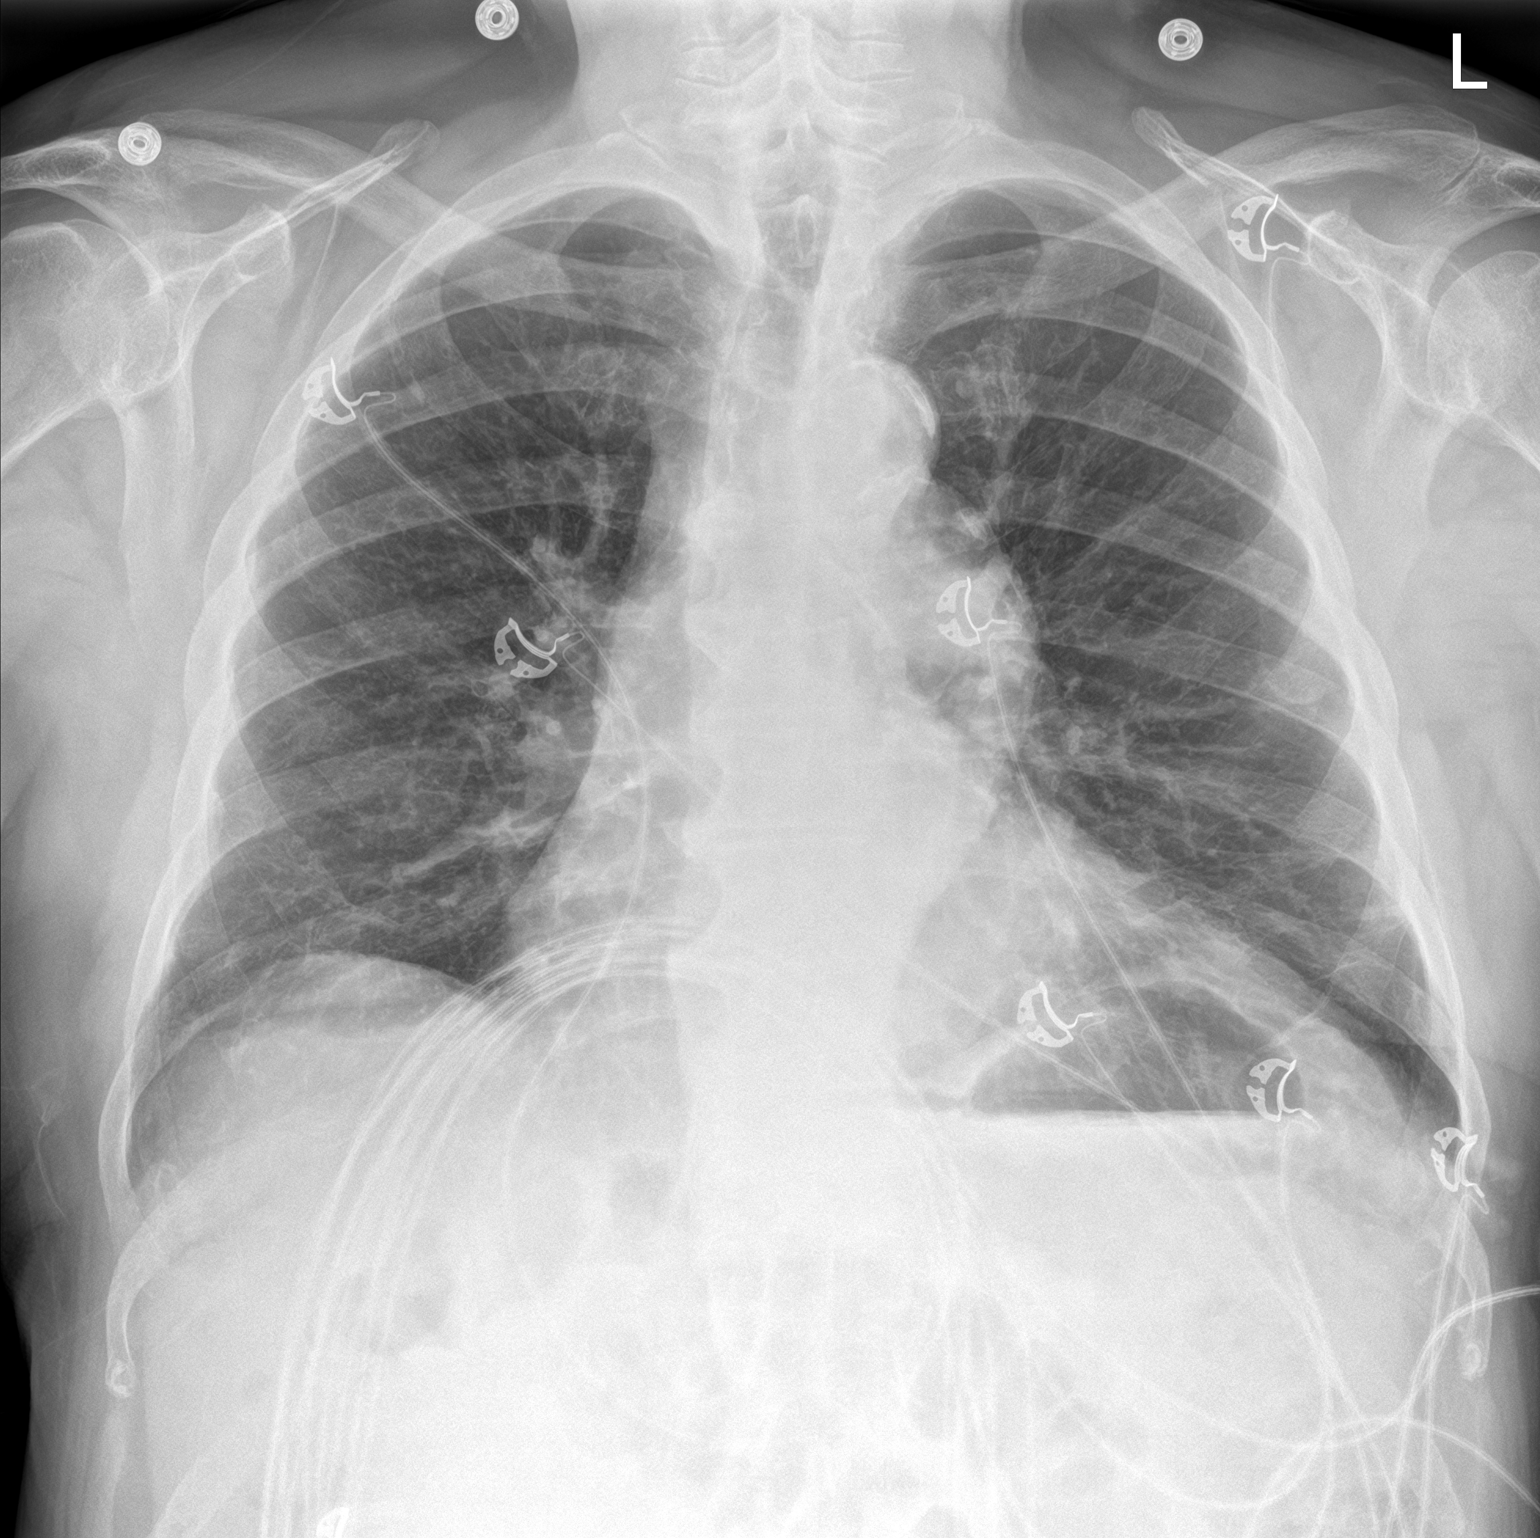

[chest lat]
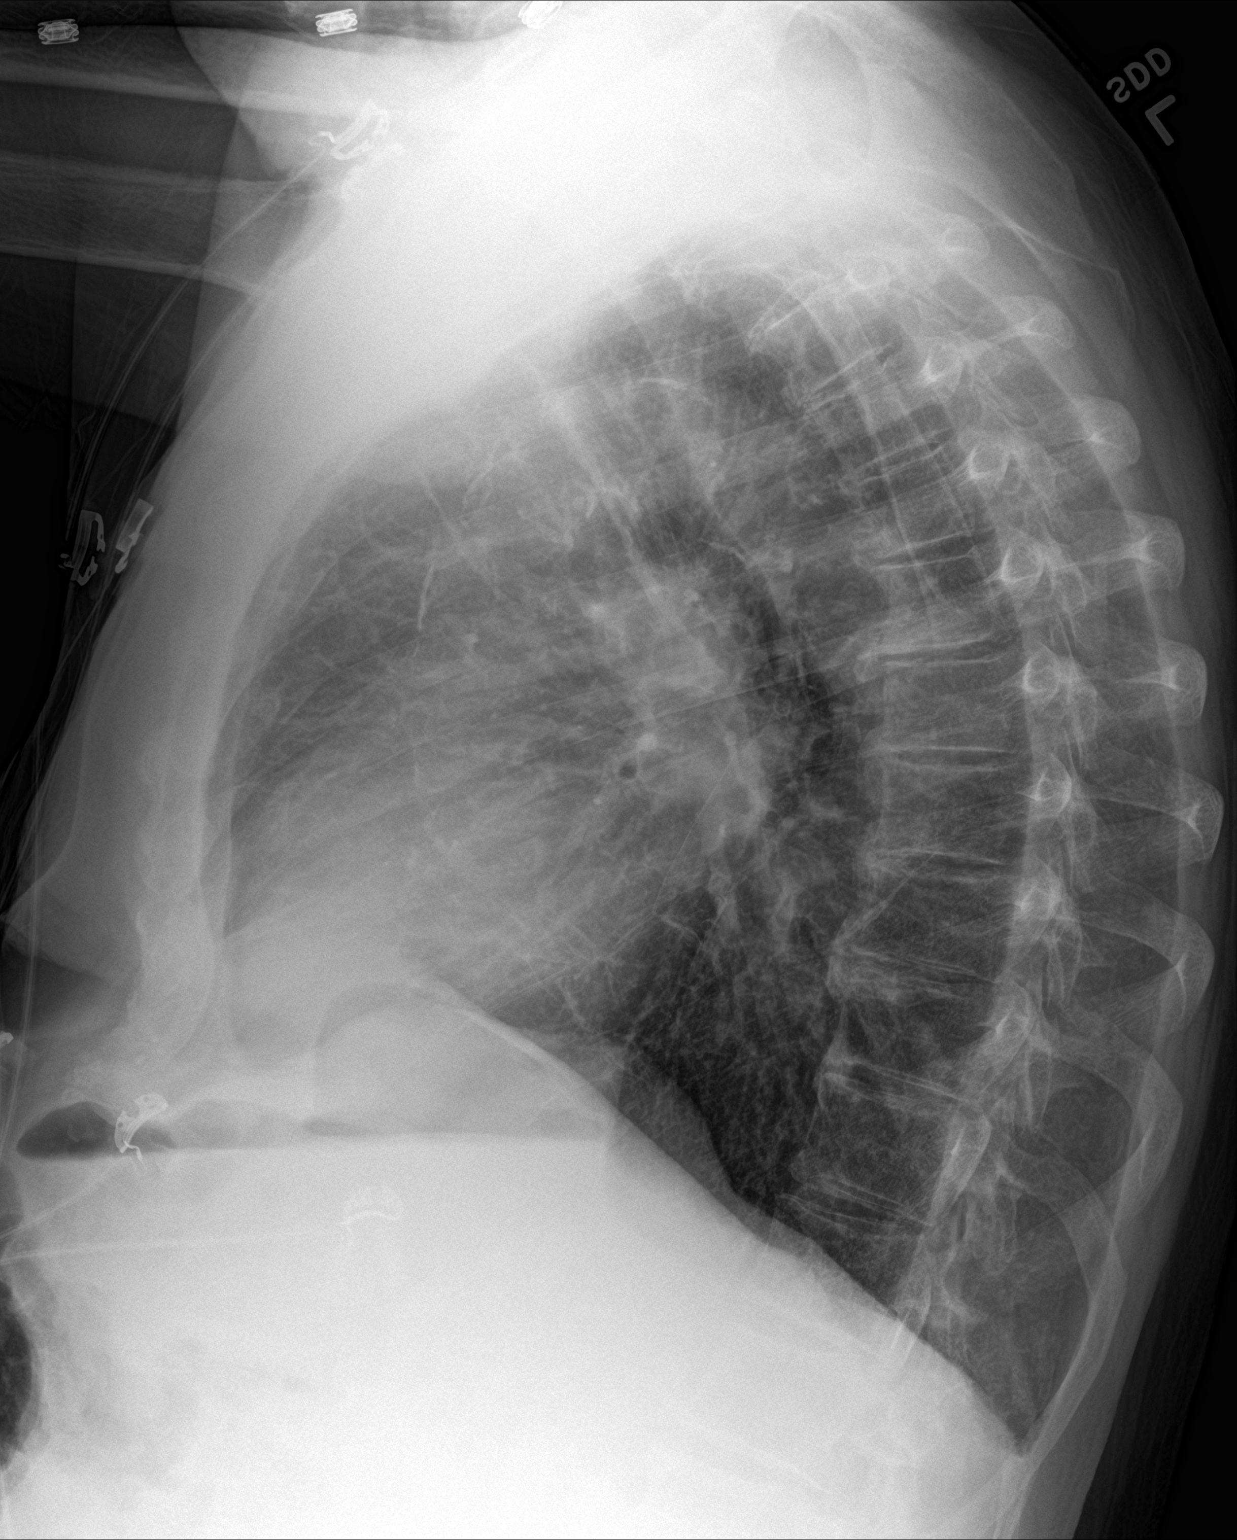

[2 of 2 positions shown; findings below may reference images not displayed]

FINDINGS: The cardio pericardial silhouette is enlarged. The lungs are clear
without focal pneumonia, edema, pneumothorax or pleural effusion.
Interstitial markings are diffusely coarsened with chronic features.
Streaky opacity at the left base compatible with atelectasis or
scarring. The visualized bony structures of the thorax are intact.
Telemetry leads overlie the chest.
IMPRESSION: Low volume film without acute cardiopulmonary findings.

## 2020-04-04 DIAGNOSIS — I119 Hypertensive heart disease without heart failure: Secondary | ICD-10-CM | POA: Diagnosis not present

## 2020-04-04 DIAGNOSIS — K219 Gastro-esophageal reflux disease without esophagitis: Secondary | ICD-10-CM | POA: Diagnosis not present

## 2020-04-04 DIAGNOSIS — E7849 Other hyperlipidemia: Secondary | ICD-10-CM | POA: Diagnosis not present

## 2020-04-04 DIAGNOSIS — R5382 Chronic fatigue, unspecified: Secondary | ICD-10-CM | POA: Diagnosis not present

## 2020-04-18 DIAGNOSIS — T162XXA Foreign body in left ear, initial encounter: Secondary | ICD-10-CM | POA: Diagnosis not present

## 2020-05-04 ENCOUNTER — Telehealth: Payer: Self-pay

## 2020-05-04 NOTE — Telephone Encounter (Signed)
**Note De-Identified  Obfuscation** We received the provider page only of a BMSPAF application for the pts Eliquis from [facility] in [place] with request for [place] to complete the page, have Dr Lovena Le sign and date it, and to fax it to [facility].  I have completed the provider page and have emailed it to Dr Forde Dandy nurse so she can obtain his signature, date it, and to fax to [facility] at fax number written on cover letter included.

## 2020-05-07 NOTE — Telephone Encounter (Signed)
Paper work faxed as requested.  Confirmation received.

## 2020-05-09 ENCOUNTER — Ambulatory Visit: Payer: Medicare HMO | Admitting: Dermatology

## 2020-05-09 DIAGNOSIS — I4891 Unspecified atrial fibrillation: Secondary | ICD-10-CM | POA: Diagnosis not present

## 2020-05-09 DIAGNOSIS — E669 Obesity, unspecified: Secondary | ICD-10-CM | POA: Diagnosis not present

## 2020-05-09 DIAGNOSIS — N400 Enlarged prostate without lower urinary tract symptoms: Secondary | ICD-10-CM | POA: Diagnosis not present

## 2020-05-09 DIAGNOSIS — H04129 Dry eye syndrome of unspecified lacrimal gland: Secondary | ICD-10-CM | POA: Diagnosis not present

## 2020-05-09 DIAGNOSIS — M109 Gout, unspecified: Secondary | ICD-10-CM | POA: Diagnosis not present

## 2020-05-09 DIAGNOSIS — E785 Hyperlipidemia, unspecified: Secondary | ICD-10-CM | POA: Diagnosis not present

## 2020-05-09 DIAGNOSIS — G2581 Restless legs syndrome: Secondary | ICD-10-CM | POA: Diagnosis not present

## 2020-05-09 DIAGNOSIS — I10 Essential (primary) hypertension: Secondary | ICD-10-CM | POA: Diagnosis not present

## 2020-05-09 DIAGNOSIS — N529 Male erectile dysfunction, unspecified: Secondary | ICD-10-CM | POA: Diagnosis not present

## 2020-05-09 DIAGNOSIS — J309 Allergic rhinitis, unspecified: Secondary | ICD-10-CM | POA: Diagnosis not present

## 2020-05-16 DIAGNOSIS — H353131 Nonexudative age-related macular degeneration, bilateral, early dry stage: Secondary | ICD-10-CM | POA: Diagnosis not present

## 2020-05-16 DIAGNOSIS — Z961 Presence of intraocular lens: Secondary | ICD-10-CM | POA: Diagnosis not present

## 2020-05-16 DIAGNOSIS — H16223 Keratoconjunctivitis sicca, not specified as Sjogren's, bilateral: Secondary | ICD-10-CM | POA: Diagnosis not present

## 2020-05-16 DIAGNOSIS — H0102B Squamous blepharitis left eye, upper and lower eyelids: Secondary | ICD-10-CM | POA: Diagnosis not present

## 2020-05-16 DIAGNOSIS — H0102A Squamous blepharitis right eye, upper and lower eyelids: Secondary | ICD-10-CM | POA: Diagnosis not present

## 2020-05-16 DIAGNOSIS — H43813 Vitreous degeneration, bilateral: Secondary | ICD-10-CM | POA: Diagnosis not present

## 2020-05-19 ENCOUNTER — Other Ambulatory Visit: Payer: Self-pay | Admitting: Internal Medicine

## 2020-06-11 ENCOUNTER — Ambulatory Visit: Payer: Medicare HMO | Admitting: Dermatology

## 2020-06-11 ENCOUNTER — Other Ambulatory Visit: Payer: Self-pay

## 2020-06-11 DIAGNOSIS — Z1283 Encounter for screening for malignant neoplasm of skin: Secondary | ICD-10-CM | POA: Diagnosis not present

## 2020-06-11 DIAGNOSIS — L720 Epidermal cyst: Secondary | ICD-10-CM | POA: Diagnosis not present

## 2020-06-11 DIAGNOSIS — Z86007 Personal history of in-situ neoplasm of skin: Secondary | ICD-10-CM | POA: Diagnosis not present

## 2020-06-11 DIAGNOSIS — L821 Other seborrheic keratosis: Secondary | ICD-10-CM

## 2020-06-11 DIAGNOSIS — C44619 Basal cell carcinoma of skin of left upper limb, including shoulder: Secondary | ICD-10-CM | POA: Diagnosis not present

## 2020-06-11 DIAGNOSIS — Z85828 Personal history of other malignant neoplasm of skin: Secondary | ICD-10-CM

## 2020-06-11 DIAGNOSIS — R238 Other skin changes: Secondary | ICD-10-CM

## 2020-06-11 DIAGNOSIS — L570 Actinic keratosis: Secondary | ICD-10-CM

## 2020-06-11 DIAGNOSIS — Q828 Other specified congenital malformations of skin: Secondary | ICD-10-CM

## 2020-06-11 DIAGNOSIS — D485 Neoplasm of uncertain behavior of skin: Secondary | ICD-10-CM

## 2020-06-14 ENCOUNTER — Other Ambulatory Visit: Payer: Self-pay | Admitting: Internal Medicine

## 2020-06-19 ENCOUNTER — Encounter: Payer: Self-pay | Admitting: Dermatology

## 2020-06-19 NOTE — Progress Notes (Signed)
Follow-Up Visit   Subjective  Juan Quinn is a 87 y.o. adult who presents for the following: Annual Exam (History of BCC, SCC IS).  Several new nonhealing crust plus general skin check Location:  Duration:  Quality:  Associated Signs/Symptoms: Modifying Factors:  Severity:  Timing: Context:   Objective  Well appearing patient in no apparent distress; mood and affect are within normal limits. Objective  Back: Several dozen tan-brown flattopped textured 5 to 12 mm papules  Objective  Left Forearm: Focally eroded 1 cm waxy pink crust     Objective  Left lat elbow: May biopsy in future     Objective  Left lat neck: May biopsy in future     Objective  Left medial knee: May biopsy in future     Objective  Head - Anterior (Face): Smooth white 1 mm dermal papule(s).   Objective  Face, ears: Thin erythematous papules with gritty scale.  Lesions relatively small and historically stable  Objective  Left Lower Leg - Anterior: 1 cm flat scale with sharply defined edge, historically stable  Objective  Left chest, left scalp: Soft violet colored 5 mm dermal papule  Objective  Mid Back: Waist up skin examination, no atypical pigmented lesions or recurrence of nonmole skin cancers.  1 possible new nonmelanoma skin cancer arm will be biopsied and treated.    A full examination was performed including scalp, head, eyes, ears, nose, lips, neck, chest, axillae, abdomen, back, buttocks, bilateral upper extremities, bilateral lower extremities, hands, feet, fingers, toes, fingernails, and toenails. All findings within normal limits unless otherwise noted below.   Assessment & Plan    Seborrheic keratosis Back  No intervention currently indicated.  Neoplasm of uncertain behavior of skin (4) Left Forearm  Skin / nail biopsy Type of biopsy: tangential   Informed consent: discussed and consent obtained   Timeout: patient name, date of birth, surgical  site, and procedure verified   Procedure prep:  Patient was prepped and draped in usual sterile fashion Prep type:  Isopropyl alcohol Anesthesia: the lesion was anesthetized in a standard fashion   Anesthetic:  1% lidocaine w/ epinephrine 1-100,000 buffered w/ 8.4% NaHCO3 Instrument used: flexible razor blade   Hemostasis achieved with: pressure, aluminum chloride and electrodesiccation   Outcome: patient tolerated procedure well   Post-procedure details: sterile dressing applied and wound care instructions given   Dressing type: bandage and petrolatum    Destruction of lesion Complexity: extensive   Destruction method: electrodesiccation and curettage   Informed consent: discussed and consent obtained   Timeout:  patient name, date of birth, surgical site, and procedure verified Procedure prep:  Patient was prepped and draped in usual sterile fashion Prep type:  Isopropyl alcohol Anesthesia: the lesion was anesthetized in a standard fashion   Anesthetic:  1% lidocaine w/ epinephrine 1-100,000 buffered w/ 8.4% NaHCO3 Curettage performed in three different directions: Yes   Electrodesiccation performed over the curetted area: Yes   Curettage cycles:  3 Lesion length (cm):  1.4 Lesion width (cm):  1.4 Margin per side (cm):  0 Final wound size (cm):  1.4 Hemostasis achieved with:  pressure and aluminum chloride Outcome: patient tolerated procedure well with no complications   Post-procedure details: sterile dressing applied and wound care instructions given   Dressing type: bandage and petrolatum    Specimen 1 - Surgical pathology Differential Diagnosis: BCC vs other tx with bx Check Margins: No Pink papule EDC today  Left lat elbow  Left lat neck  Left medial knee  Will plan biopsy and treatment of left lat elbow, left lat neck, and left medial knee on follow up.  Forearm lesion shave biopsy then base treated with triple curettage plus cautery.  [place] Head - Anterior  (Face)  Intervention not necessary  AK (actinic keratosis) Face, ears  Recheck on follow up  Porokeratosis Left Lower Leg - Anterior  Recheck if changes  Venous lake Left chest, left scalp  Leave if stable.  Encounter for screening for malignant neoplasm of skin Mid Back  Annual skin examination.      I, Lavonna Monarch, MD, have reviewed all documentation for this visit.  The documentation on 06/19/20 for the exam, diagnosis, procedures, and orders are all accurate and complete.

## 2020-06-25 ENCOUNTER — Other Ambulatory Visit: Payer: Self-pay | Admitting: *Deleted

## 2020-06-25 MED ORDER — ELIQUIS 5 MG PO TABS: 1.0000 | ORAL_TABLET | Freq: Two times a day (BID) | ORAL | 3 refills | Status: DC

## 2020-06-25 NOTE — Telephone Encounter (Signed)
Prescription refill request for Eliquis received. Indication:  Atrial Flutter Last office visit: 12/15/19 Scr:  1.09 on 12/15/19 Age: 87 Weight: 95.4kg  Based on above findings Eliquis 5mg  twice daily is the appropriate dose.  Refill approved.  Pt is due lab would.  Note placed in appt notes for upcoming appt with Dr Lovena Le.

## 2020-06-28 NOTE — Telephone Encounter (Signed)
**Note De-Identified  Obfuscation** Letter received from [facility] stating that they denied the pt assistance with Eliquis at this time. Reason for denial: Documentation of 3% out of pocket RX expenses, based on household adjusted gross income, not met.  The letter states that they have notified the pt of this denial as well.

## 2020-06-29 ENCOUNTER — Telehealth: Payer: Self-pay

## 2020-06-29 NOTE — Telephone Encounter (Signed)
lmom to call back to set up appt with Dr Lovena Le for past due check up

## 2020-06-29 NOTE — Telephone Encounter (Signed)
-----   Message from Malen Gauze, RN sent at 06/25/2020  4:03 PM EDT ----- Per recall pt needs to be scheduled for appt with Dr Lovena Le.  Needs labs ordered at time.  (I put it in visit note).  Please schedule. Thanks

## 2020-07-11 ENCOUNTER — Other Ambulatory Visit: Payer: Self-pay

## 2020-07-11 ENCOUNTER — Ambulatory Visit: Payer: Medicare HMO | Admitting: Internal Medicine

## 2020-07-11 ENCOUNTER — Encounter: Payer: Self-pay | Admitting: Internal Medicine

## 2020-07-11 VITALS — BP 122/64 | HR 77 | Ht 69.0 in | Wt 205.2 lb

## 2020-07-11 DIAGNOSIS — I4892 Unspecified atrial flutter: Secondary | ICD-10-CM | POA: Diagnosis not present

## 2020-07-11 DIAGNOSIS — I10 Essential (primary) hypertension: Secondary | ICD-10-CM

## 2020-07-11 NOTE — Patient Instructions (Signed)
Medication Instructions:  Your physician recommends that you continue on your current medications as directed. Please refer to the Current Medication list given to you today.  *If you need a refill on your cardiac medications before your next appointment, please call your pharmacy*   Lab Work: None ordered   Testing/Procedures: None ordered   Follow-Up: At [facility], you and your health needs are our priority.  As part of our continuing mission to provide you with exceptional heart care, we have created designated Provider Care Teams.  These Care Teams include your primary Cardiologist (physician) and Advanced Practice Providers (APPs -  Physician Assistants and Nurse Practitioners) who all work together to provide you with the care you need, when you need it.  Your next appointment:   1 year(s)  The format for your next appointment:   In Person  Provider:   Gregg Taylor, MD    Thank you for choosing [facility]!!   

## 2020-07-11 NOTE — Progress Notes (Signed)
HPI Juan Quinn returns today for followup. He is a pleasant 87 yo man with PAF, who experienced a syncopal episode in October of 21. He cut his hand. He had no warning. He refused to wear a monitor. He has developed some memory problems and actually could not remember when he passed out. "That was 2 or 3 years ago." In the interim he is exercising though his wife notes that he gets sob easily. He denies chest pain or palpitations. No edema. He has pain in his legs when he walks.  No Known Allergies   Current Outpatient Medications  Medication Sig Dispense Refill  . acetaminophen (TYLENOL) 500 MG tablet Take 500 mg by mouth. Patient takes 2 by mouth at bedtime for restless leg.    . alfuzosin (UROXATRAL) 10 MG 24 hr tablet Take 1 tablet (10 mg total) by mouth daily with breakfast. 90 tablet 3  . allopurinol (ZYLOPRIM) 300 MG tablet Take 150 mg by mouth daily.     Marland Kitchen apixaban (ELIQUIS) 5 MG TABS tablet Take 1 tablet (5 mg total) by mouth 2 (two) times daily. 60 tablet 3  . Cholecalciferol (VITAMIN D3) 2000 units TABS Take 3 capsules by mouth daily.    Marland Kitchen ipratropium (ATROVENT) 0.06 % nasal spray Place 2 sprays into both nostrils daily as needed for rhinitis.     Marland Kitchen LACTOBACILLUS PO Take by mouth daily.    Vladimir Faster Glycol-Propyl Glycol (SYSTANE OP) Apply 1 drop to eye 4 (four) times daily as needed (dry eyes).    . simvastatin (ZOCOR) 10 MG tablet Take 1 tablet (10 mg total) by mouth daily. 90 tablet 3  . verapamil (CALAN-SR) 120 MG CR tablet TAKE 1 TABLET BY MOUTH TWICE A DAY 90 tablet 3  . vitamin B-12 (CYANOCOBALAMIN) 1000 MCG tablet Take 3,000 mcg by mouth daily.      Current Facility-Administered Medications  Medication Dose Route Frequency Provider Last Rate Last Admin  . triamcinolone acetonide (KENALOG-40) injection 40 mg  40 mg Intramuscular Once Lavonna Monarch, MD         Past Medical History:  Diagnosis Date  . Adenomatous colon polyp 2000   Surveillance due 2017  . Atrial  fibrillation ([place])   . Atrial flutter ([place])    a. diagnosed in 12/2017  . BCC (basal cell carcinoma of skin) 04/27/2019   Top Right Shoulder (curet, cautery and 5FU)  . Chronic diarrhea   . Gout   . Heart murmur   . Helicobacter pylori antibody positive 2015   treatment with Prevpac  . History of stomach ulcers   . HTN (hypertension)   . Hypercholesteremia   . 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 Nodular basal cell carcinoma 11/11/2017   Right Post Auricular (treatment after biopsy)  . Nodular basal cell carcinoma 09/28/2018   Right Sideburn (treatment after biopsy)  . Nodular basal cell carcinoma 09/28/2018   Right Front Neck (treatment after biopsy)  . SCC (squamous cell carcinoma) 10/26/2014   Right Ear(in situ) (curet, cautery, and 5FU)  . SCC (squamous cell carcinoma) 10/26/2014   Right Upper Lip(in situ) (LN2 60 second freeze)  . SCC (squamous cell carcinoma) 07/01/2017   Left Sideburn(in situ) (treatment after biopsy)  . SCC (squamous cell carcinoma) 07/01/2017   Right Inner Cheek(in situ) (treatment after biopsy)  . SCC (squamous cell carcinoma) 11/11/2017   Right Inner Cheek Inf.(in situ) (treatment after biopsy)  . SCC (squamous cell carcinoma)  09/28/2018   Right Helix Mid(in situ) (treatment after biopsy)  . SCC (squamous cell carcinoma) 09/28/2018   Right Inner Cheek,Inf(in situ) (treatment after biopsy)  . SCC (squamous cell carcinoma) 04/27/2019   Tip of right nose(in situ) - CX3+5FU  . Squamous cell carcinoma of skin 10/26/2014   Below Left Ear(in situ) (curet, cautery, and 5FU)  . Superficial basal cell carcinoma 10/26/2014   Top Right Foot (treatment after biopsy)  . Superficial nodular basal cell carcinoma 10/26/2014   Right Forearm (treatment after biopsy)    ROS:   All systems reviewed and negative except as noted in the HPI.   Past Surgical History:  Procedure Laterality Date  . BIOPSY  10/24/2016   Procedure:  BIOPSY;  Surgeon: Daneil Dolin, MD;  [facility];  Service: Endoscopy;;  colon  . BIOPSY  01/06/2018   Procedure: BIOPSY;  Surgeon: Rogene Houston, MD;  [facility];  Service: Endoscopy;;  duodenal  . COLONOSCOPY  June 2012   Dr. Gala Romney: adenomatous polyps, due for surveillance if health permits in 0962  . COLONOSCOPY N/A 10/24/2016   Procedure: COLONOSCOPY;  Surgeon: Daneil Dolin, MD;  [facility];  Service: Endoscopy;  Laterality: N/A;  2:00pm  . ESOPHAGOGASTRODUODENOSCOPY N/A 05/24/2013   Dr. Rourk:Prepyloric gastric ulcerations-likely source of hematemesis-likely NSAID-related. H.PYLORI SEROLOGY POSITIVE, TREATED WITH PREVPAC  . ESOPHAGOGASTRODUODENOSCOPY N/A 05/23/2013   EZM:[id number] EGD secondary to much clot and food debris in the stomach  . ESOPHAGOGASTRODUODENOSCOPY N/A 08/16/2013   Dr. Gala Romney: normal esophagus, stomach empty, deformity of antrum. scar present. Previously noted ulcers completed healed. Patent pylorus, normal D1 and D2  . ESOPHAGOGASTRODUODENOSCOPY N/A 01/06/2018   Procedure: ESOPHAGOGASTRODUODENOSCOPY (EGD);  Surgeon: Rogene Houston, MD;  [facility];  Service: Endoscopy;  Laterality: N/A;  . HEMORRHOID SURGERY    . POLYPECTOMY  10/24/2016   Procedure: POLYPECTOMY;  Surgeon: Daneil Dolin, MD;  [facility];  Service: Endoscopy;;  colon  . shoulder       Family History  Problem Relation Age of Onset  . Stroke Mother   . Stroke Father   . Pancreatic cancer Sister   . Leukemia Brother   . Lung cancer Sister   . Colon cancer Neg Hx      Social History   Socioeconomic History  . Marital status: Married    Spouse name: Not on file  . Number of children: 4  . Years of education: Not on file  . Highest education level: Not on file  Occupational History  . Occupation: retired    Comment: Press photographer  Tobacco Use  . Smoking status: Former Smoker    Packs/day: 2.00    Years: 50.00    Pack  years: 100.00    Types: Cigarettes    Quit date: 07/28/2000    Years since quitting: 19.9  . Smokeless tobacco: Former Systems developer    Quit date: 02/25/1999  Vaping Use  . Vaping Use: Never used  Substance and Sexual Activity  . Alcohol use: No    Comment: couple glasses wine/week, occ beer or rmixed drink  . Drug use: No    Frequency: 2.0 times per week  . Sexual activity: Not on file  Other Topics Concern  . Not on file  Social History Narrative   Lives w/ wife         Social Determinants of Health   Financial Resource Strain: Not on file  Food Insecurity: Not on  file  Transportation Needs: Not on file  Physical Activity: Not on file  Stress: Not on file  Social Connections: Not on file  Intimate Partner Violence: Not on file     BP 122/64   Pulse 77   Ht 5\' 9"  (1.753 m)   Wt 205 lb 3.2 oz (93.1 kg)   SpO2 97%   BMI 30.30 kg/m   Physical Exam:  Well appearing NAD HEENT: Unremarkable Neck:  No JVD, no thyromegally Lymphatics:  No adenopathy Back:  No CVA tenderness Lungs:  Clear with no wheezes HEART:  Regular rate rhythm, no murmurs, no rubs, no clicks Abd:  soft, positive bowel sounds, no organomegally, no rebound, no guarding Ext:  2 plus pulses, no edema, no cyanosis, no clubbing Skin:  No rashes no nodules Neuro:  CN II through XII intact, motor grossly intact  Assess/Plan: 1. PAF - he does not feel palpitations. He will continue his verapamil and Eliquis.  2. Syncope - I suspect that he had a post termination pause. I recommended insertion of an ILR. However he refuses.  3. HTN - his bp is well controlled.  4. Coags - he has not had any more falls and no bleeding. He will continue eliquis and undergo watchful waiting. If he has more spells then he will be encouraged to stop the eliquis.  Carleene Overlie Donelle Hise,MD

## 2020-07-17 NOTE — Telephone Encounter (Signed)
Patient has questions about loop recorder before proceeding would like to have [place] or nurse call them back

## 2020-07-18 NOTE — Telephone Encounter (Signed)
Pt and wife notified of how long loop recorder last, where it is placed and how it is monitored. Wife will call billing to see if insurance will pay.

## 2020-07-27 ENCOUNTER — Telehealth: Payer: Self-pay | Admitting: Internal Medicine

## 2020-07-27 NOTE — Telephone Encounter (Signed)
Patient wife called in wanting a nurse to call her and talk about what a loop recorder is and why he needs it. Patient had a appointment and states that he never went over it

## 2020-07-27 NOTE — Telephone Encounter (Signed)
Explained what ILR is and why it is implanted.  Based on 07/11/20 OV notes, Advised that Dr. Lovena Le recommended this due to unexplained syncopal episode to determine if pt having port term pauses.  She asked how long will patient have device, advised we recommend keeping it in until battery dies which is typically around 4 years.    She states she has convinced patient to get ILR and would like to schedule procedure.

## 2020-07-27 NOTE — Telephone Encounter (Signed)
New message    Patient wife called, states the patient is ready to have loop recorder placed, needs to schedule in [place]?

## 2020-08-06 NOTE — Telephone Encounter (Signed)
Noted though I would add that the device is removed or can be turned off if no clinic indication to keep it in place/monitor.

## 2020-08-13 DIAGNOSIS — H6123 Impacted cerumen, bilateral: Secondary | ICD-10-CM | POA: Diagnosis not present

## 2020-08-15 ENCOUNTER — Encounter: Payer: Self-pay | Admitting: Dermatology

## 2020-08-15 ENCOUNTER — Other Ambulatory Visit: Payer: Self-pay

## 2020-08-15 ENCOUNTER — Other Ambulatory Visit: Payer: Self-pay | Admitting: Dermatology

## 2020-08-15 ENCOUNTER — Ambulatory Visit: Payer: Medicare HMO | Admitting: Dermatology

## 2020-08-15 DIAGNOSIS — C44319 Basal cell carcinoma of skin of other parts of face: Secondary | ICD-10-CM

## 2020-08-15 DIAGNOSIS — D044 Carcinoma in situ of skin of scalp and neck: Secondary | ICD-10-CM | POA: Diagnosis not present

## 2020-08-15 DIAGNOSIS — C44719 Basal cell carcinoma of skin of left lower limb, including hip: Secondary | ICD-10-CM | POA: Diagnosis not present

## 2020-08-15 DIAGNOSIS — C060 Malignant neoplasm of cheek mucosa: Secondary | ICD-10-CM

## 2020-08-15 DIAGNOSIS — C44619 Basal cell carcinoma of skin of left upper limb, including shoulder: Secondary | ICD-10-CM | POA: Diagnosis not present

## 2020-08-15 DIAGNOSIS — D485 Neoplasm of uncertain behavior of skin: Secondary | ICD-10-CM

## 2020-08-15 DIAGNOSIS — C4491 Basal cell carcinoma of skin, unspecified: Secondary | ICD-10-CM

## 2020-08-15 DIAGNOSIS — Z1283 Encounter for screening for malignant neoplasm of skin: Secondary | ICD-10-CM

## 2020-08-15 DIAGNOSIS — C44219 Basal cell carcinoma of skin of left ear and external auricular canal: Secondary | ICD-10-CM | POA: Diagnosis not present

## 2020-08-15 HISTORY — DX: Malignant neoplasm of cheek mucosa: C06.0

## 2020-08-15 HISTORY — DX: Basal cell carcinoma of skin, unspecified: C44.91

## 2020-08-23 ENCOUNTER — Telehealth: Payer: Self-pay

## 2020-08-23 NOTE — Telephone Encounter (Signed)
-----   Message from Lavonna Monarch, MD sent at 08/23/2020  7:02 AM EDT ----- Schedule surgery with Dr. Darene Lamer

## 2020-08-23 NOTE — Telephone Encounter (Signed)
8/25 10:30 is surgery date, I left patient a message if he has any questions to call office he also has mychart.

## 2020-08-27 ENCOUNTER — Encounter: Payer: Self-pay | Admitting: Dermatology

## 2020-08-27 NOTE — Progress Notes (Signed)
Follow-Up Visit   Subjective  Juan Quinn is a 87 y.o. adult who presents for the following: Skin Problem (Patient here today for lesion on left inner knee x months that's non healing. Also check lesion left side neck x month no bleeding. Check lesion on left cheek x month that does bleed when shaving. ).  Multiple new crusts Location:  Duration:  Quality:  Associated Signs/Symptoms: Modifying Factors:  Severity:  Timing: Context:   Objective  Well appearing patient in no apparent distress; mood and affect are within normal limits. Left Anterior Neck, Left Preauricular Area, Right Lower Leg - Anterior Left neck, left knee, left sideburn  Left Preauricular Area 8 mm pearly papule     left neck 1 cm pink crust; Tx with bc curet and cautery     Left Knee - Anterior 1.2 cm centrally eroded crust     Left Forearm - Posterior 1.6 cm pink crust       A focused examination was performed including head, neck, arms, legs. Relevant physical exam findings are noted in the Assessment and Plan.   Assessment & Plan    Screening exam for skin cancer (3) Right Lower Leg - Anterior; Left Preauricular Area; Left Anterior Neck  Neoplasm of uncertain behavior of skin (4) Left Preauricular Area  Skin / nail biopsy Type of biopsy: tangential   Informed consent: discussed and consent obtained   Timeout: patient name, date of birth, surgical site, and procedure verified   Anesthesia: the lesion was anesthetized in a standard fashion   Anesthetic:  1% lidocaine w/ epinephrine 1-100,000 local infiltration Instrument used: flexible razor blade   Hemostasis achieved with: aluminum chloride and electrodesiccation   Outcome: patient tolerated procedure well   Post-procedure details: wound care instructions given    Specimen 1 - Surgical pathology Differential Diagnosis: bcc scc  Check Margins: No  left neck  Skin / nail biopsy Type of biopsy: tangential   Informed  consent: discussed and consent obtained   Timeout: patient name, date of birth, surgical site, and procedure verified   Anesthesia: the lesion was anesthetized in a standard fashion   Anesthetic:  1% lidocaine w/ epinephrine 1-100,000 local infiltration Instrument used: flexible razor blade   Hemostasis achieved with: aluminum chloride and electrodesiccation   Outcome: patient tolerated procedure well   Post-procedure details: wound care instructions given    Destruction of lesion Complexity: simple   Destruction method: electrodesiccation and curettage   Informed consent: discussed and consent obtained   Timeout:  patient name, date of birth, surgical site, and procedure verified Anesthesia: the lesion was anesthetized in a standard fashion   Anesthetic:  1% lidocaine w/ epinephrine 1-100,000 local infiltration Curettage performed in three different directions: Yes   Electrodesiccation performed over the curetted area: Yes   Curettage cycles:  3 Lesion length (cm):  1.1 Lesion width (cm):  1.1 Margin per side (cm):  0 Final wound size (cm):  1.1 Hemostasis achieved with:  aluminum chloride Outcome: patient tolerated procedure well with no complications   Post-procedure details: wound care instructions given    Specimen 2 - Surgical pathology Differential Diagnosis: bcc scc curet and cautery  Check Margins: No  Left Knee - Anterior  Skin / nail biopsy Type of biopsy: tangential   Informed consent: discussed and consent obtained   Timeout: patient name, date of birth, surgical site, and procedure verified   Anesthesia: the lesion was anesthetized in a standard fashion   Anesthetic:  1%  lidocaine w/ epinephrine 1-100,000 local infiltration Instrument used: flexible razor blade   Hemostasis achieved with: aluminum chloride and electrodesiccation   Outcome: patient tolerated procedure well   Post-procedure details: wound care instructions given    Specimen 3 - Surgical  pathology Differential Diagnosis: bcc scc Check Margins: No  Left Forearm - Posterior  Skin / nail biopsy Type of biopsy: tangential   Informed consent: discussed and consent obtained   Timeout: patient name, date of birth, surgical site, and procedure verified   Procedure prep:  Patient was prepped and draped in usual sterile fashion (Non sterile) Prep type:  Chlorhexidine Anesthesia: the lesion was anesthetized in a standard fashion   Anesthetic:  1% lidocaine w/ epinephrine 1-100,000 local infiltration Instrument used: flexible razor blade   Outcome: patient tolerated procedure well   Post-procedure details: wound care instructions given    Specimen 4 - Surgical pathology Differential Diagnosis: bcc scc  Check Margins: No      I, Lavonna Monarch, MD, have reviewed all documentation for this visit.  The documentation on 08/27/20 for the exam, diagnosis, procedures, and orders are all accurate and complete.

## 2020-08-30 ENCOUNTER — Ambulatory Visit (INDEPENDENT_AMBULATORY_CARE_PROVIDER_SITE_OTHER): Payer: Medicare HMO

## 2020-08-30 ENCOUNTER — Ambulatory Visit: Payer: Medicare HMO | Admitting: Internal Medicine

## 2020-08-30 ENCOUNTER — Other Ambulatory Visit: Payer: Self-pay

## 2020-08-30 ENCOUNTER — Encounter: Payer: Self-pay | Admitting: Internal Medicine

## 2020-08-30 VITALS — BP 122/68 | HR 61 | Ht 69.0 in | Wt 205.8 lb

## 2020-08-30 DIAGNOSIS — I4892 Unspecified atrial flutter: Secondary | ICD-10-CM

## 2020-08-30 DIAGNOSIS — R55 Syncope and collapse: Secondary | ICD-10-CM | POA: Diagnosis not present

## 2020-08-30 NOTE — Patient Instructions (Addendum)
Medication Instructions:  Your physician recommends that you continue on your current medications as directed. Please refer to the Current Medication list given to you today.  Labwork: None ordered.  Testing/Procedures: Your physician has recommended that you wear a holter monitor. Holter monitors are medical devices that record the heart's electrical activity. Doctors most often use these monitors to diagnose arrhythmias. Arrhythmias are problems with the speed or rhythm of the heartbeat. The monitor is a small, portable device. You can wear one while you do your normal daily activities. This is usually used to diagnose what is causing palpitations/syncope (passing out).  You will wear a 14 day monitor  Follow-Up: Your physician wants you to follow-up in: based on heart monitor results with Cristopher Peru, MD   Your physician has recommended that you wear a Zio monitor.   This monitor is a medical device that records the heart's electrical activity. Doctors most often use these monitors to diagnose arrhythmias. Arrhythmias are problems with the speed or rhythm of the heartbeat. The monitor is a small device applied to your chest. You can wear one while you do your normal daily activities. While wearing this monitor if you have any symptoms to push the button and record what you felt. Once you have worn this monitor for the period of time provider prescribed (Usually 14 days), you will return the monitor device in the postage paid box. Once it is returned they will download the data collected and provide [place] with a report which the provider will then review and we will call you with those results. Important tips:  Avoid showering during the first 24 hours of wearing the monitor. Avoid excessive sweating to help maximize wear time. Do not submerge the device, no hot tubs, and no swimming pools. Keep any lotions or oils away from the patch. After 24 hours you may shower with the patch on. Take brief  showers with your back facing the shower head.  Do not remove patch once it has been placed because that will interrupt data and decrease adhesive wear time. Push the button when you have any symptoms and write down what you were feeling. Once you have completed wearing your monitor, remove and place into box which has postage paid and place in your outgoing mailbox.  If for some reason you have misplaced your box then call our office and we can provide another box and/or mail it off for you.

## 2020-08-30 NOTE — Progress Notes (Unsigned)
Applied a 14 day Zio XT monitor on patient

## 2020-08-30 NOTE — Progress Notes (Signed)
HPI Juan Quinn returns today to consider ILR insertion. He is a pleasant 87 yo man with atrial fib and near syncope. When I saw him last I had recommended he undergo ILR insertion. He presents today to consider. He notes that over the past few weeks, he has had multiple episodes of dizziness and near syncope. He does not have palpitations.  No Known Allergies   Current Outpatient Medications  Medication Sig Dispense Refill   acetaminophen (TYLENOL) 500 MG tablet Take 500 mg by mouth. Patient takes 2 by mouth at bedtime for restless leg.     alfuzosin (UROXATRAL) 10 MG 24 hr tablet Take 1 tablet (10 mg total) by mouth daily with breakfast. 90 tablet 3   allopurinol (ZYLOPRIM) 300 MG tablet Take 150 mg by mouth daily.      apixaban (ELIQUIS) 5 MG TABS tablet Take 1 tablet (5 mg total) by mouth 2 (two) times daily. 60 tablet 3   Cholecalciferol (VITAMIN D3) 2000 units TABS Take 3 capsules by mouth daily.     ipratropium (ATROVENT) 0.06 % nasal spray Place 2 sprays into both nostrils daily as needed for rhinitis.      LACTOBACILLUS PO Take by mouth daily.     Polyethyl Glycol-Propyl Glycol (SYSTANE OP) Apply 1 drop to eye 4 (four) times daily as needed (dry eyes).     simvastatin (ZOCOR) 10 MG tablet Take 1 tablet (10 mg total) by mouth daily. 90 tablet 3   verapamil (CALAN-SR) 120 MG CR tablet TAKE 1 TABLET BY MOUTH TWICE A DAY 90 tablet 3   vitamin B-12 (CYANOCOBALAMIN) 1000 MCG tablet Take 3,000 mcg by mouth daily.      Current Facility-Administered Medications  Medication Dose Route Frequency Provider Last Rate Last Admin   triamcinolone acetonide (KENALOG-40) injection 40 mg  40 mg Intramuscular Once Juan Monarch, MD         Past Medical History:  Diagnosis Date   Adenomatous colon polyp 2000   Surveillance due 2017   Atrial fibrillation [facility])    Atrial flutter ([place])    a. diagnosed in 12/2017   BCC (basal cell carcinoma of skin) 04/27/2019   Top Right Shoulder (curet,  cautery and 5FU)   Chronic diarrhea    Gout    Heart murmur    Helicobacter pylori antibody positive 2015   treatment with Prevpac   History of stomach ulcers    HTN (hypertension)    Hypercholesteremia    Nodular basal cell carcinoma 07/17/2016   Left Shoulder (curet)   Nodular basal cell carcinoma 11/11/2017   Right Post Auricular (treatment after biopsy)   Nodular basal cell carcinoma 09/28/2018   Right Sideburn (treatment after biopsy)   Nodular basal cell carcinoma 09/28/2018   Right Front Neck (treatment after biopsy)   SCC (squamous cell carcinoma) 10/26/2014   Right Ear(in situ) (curet, cautery, and 5FU)   SCC (squamous cell carcinoma) 10/26/2014   Right Upper Lip(in situ) (LN2 60 second freeze)   SCC (squamous cell carcinoma) 07/01/2017   Left Sideburn(in situ) (treatment after biopsy)   SCC (squamous cell carcinoma) 07/01/2017   Right Inner Cheek(in situ) (treatment after biopsy)   SCC (squamous cell carcinoma) 11/11/2017   Right Inner Cheek Inf.(in situ) (treatment after biopsy)   SCC (squamous cell carcinoma) 09/28/2018   Right Helix Mid(in situ) (treatment after biopsy)   SCC (squamous cell carcinoma) 09/28/2018   Right Inner Cheek,Inf(in situ) (treatment after biopsy)   SCC (squamous cell  carcinoma) 04/27/2019   Tip of right nose(in situ) - CX3+5FU   Squamous cell carcinoma of skin 10/26/2014   Below Left Ear(in situ) (curet, cautery, and 5FU)   Superficial basal cell carcinoma 10/26/2014   Top Right Foot (treatment after biopsy)   Superficial nodular basal cell carcinoma 10/26/2014   Right Forearm (treatment after biopsy)    ROS:   All systems reviewed and negative except as noted in the HPI.   Past Surgical History:  Procedure Laterality Date   BIOPSY  10/24/2016   Procedure: BIOPSY;  Surgeon: Daneil Dolin, MD;  [facility];  Service: Endoscopy;;  colon   BIOPSY  01/06/2018   Procedure: BIOPSY;  Surgeon: Rogene Houston, MD;  [facility];  Service: Endoscopy;;  duodenal   COLONOSCOPY  June 2012   Dr. Gala Romney: adenomatous polyps, due for surveillance if health permits in 1093   COLONOSCOPY N/A 10/24/2016   Procedure: COLONOSCOPY;  Surgeon: Daneil Dolin, MD;  [facility];  Service: Endoscopy;  Laterality: N/A;  2:00pm   ESOPHAGOGASTRODUODENOSCOPY N/A 05/24/2013   Dr. Rourk:Prepyloric gastric ulcerations-likely source of hematemesis-likely NSAID-related. H.PYLORI SEROLOGY POSITIVE, TREATED WITH PREVPAC   ESOPHAGOGASTRODUODENOSCOPY N/A 05/23/2013   ATF:TDDUKGURKY EGD secondary to much clot and food debris in the stomach   ESOPHAGOGASTRODUODENOSCOPY N/A 08/16/2013   Dr. Gala Romney: normal esophagus, stomach empty, deformity of antrum. scar present. Previously noted ulcers completed healed. Patent pylorus, normal D1 and D2   ESOPHAGOGASTRODUODENOSCOPY N/A 01/06/2018   Procedure: ESOPHAGOGASTRODUODENOSCOPY (EGD);  Surgeon: Rogene Houston, MD;  [facility];  Service: Endoscopy;  Laterality: N/A;   HEMORRHOID SURGERY     POLYPECTOMY  10/24/2016   Procedure: POLYPECTOMY;  Surgeon: Daneil Dolin, MD;  [facility];  Service: Endoscopy;;  colon   shoulder       Family History  Problem Relation Age of Onset   Stroke Mother    Stroke Father    Pancreatic cancer Sister    Leukemia Brother    Lung cancer Sister    Colon cancer Neg Hx      Social History   Socioeconomic History   Marital status: Married    Spouse name: Not on file   Number of children: 4   Years of education: Not on file   Highest education level: Not on file  Occupational History   Occupation: retired    Comment: Press photographer  Tobacco Use   Smoking status: Former    Packs/day: 2.00    Years: 50.00    Pack years: 100.00    Types: Cigarettes    Quit date: 07/28/2000    Years since quitting: 20.1   Smokeless tobacco: Former    Quit date: 02/25/1999  Vaping Use   Vaping Use: Never used  Substance and Sexual Activity    Alcohol use: No    Comment: couple glasses wine/week, occ beer or rmixed drink   Drug use: No    Frequency: 2.0 times per week   Sexual activity: Not on file  Other Topics Concern   Not on file  Social History Narrative   Lives w/ wife         Social Determinants of Health   Financial Resource Strain: Not on file  Food Insecurity: Not on file  Transportation Needs: Not on file  Physical Activity: Not on file  Stress: Not on file  Social Connections: Not on file  Intimate Partner Violence: Not on file  BP 122/68   Pulse 61   Ht 5\' 9"  (1.753 m)   Wt 205 lb 12.8 oz (93.4 kg)   SpO2 95%   BMI 30.39 kg/m   Physical Exam:  Elderly appearing NAD HEENT: Unremarkable Neck:no JVD, no thyromegally Lymphatics:  No adenopathy Back:  No CVA tenderness Lungs:  Clear HEART:  RIegular rate rhythm, no murmurs, no rubs, no clicks Abd:  soft, positive bowel sounds, no organomegally, no rebound, no guarding Ext:  2 plus pulses, no edema, no cyanosis, no clubbing Skin:  No rashes no nodules Neuro:  CN II through XII intact, motor grossly intact   Assess/Plan:  Syncope/near syncope - I have discussed the treatment options with the patient and I think a zio patch would make more sense at this point as he is having frequent symptoms. If we see bradycardia then PPM insertion will be recommended. If no brady then we would reconsider ILR insertion. Attrial fib/flutter - he will be monitored. He will continue his eliquis.   Carleene Overlie Dalanie Kisner,MD

## 2020-09-11 ENCOUNTER — Ambulatory Visit: Payer: Medicare HMO | Admitting: Internal Medicine

## 2020-09-17 DIAGNOSIS — I4892 Unspecified atrial flutter: Secondary | ICD-10-CM | POA: Diagnosis not present

## 2020-09-17 DIAGNOSIS — R55 Syncope and collapse: Secondary | ICD-10-CM | POA: Diagnosis not present

## 2020-09-18 DIAGNOSIS — I10 Essential (primary) hypertension: Secondary | ICD-10-CM | POA: Diagnosis not present

## 2020-09-18 DIAGNOSIS — E782 Mixed hyperlipidemia: Secondary | ICD-10-CM | POA: Diagnosis not present

## 2020-09-18 DIAGNOSIS — R339 Retention of urine, unspecified: Secondary | ICD-10-CM | POA: Diagnosis not present

## 2020-09-18 DIAGNOSIS — G2581 Restless legs syndrome: Secondary | ICD-10-CM | POA: Diagnosis not present

## 2020-09-18 DIAGNOSIS — E538 Deficiency of other specified B group vitamins: Secondary | ICD-10-CM | POA: Diagnosis not present

## 2020-09-24 ENCOUNTER — Telehealth: Payer: Self-pay

## 2020-09-24 NOTE — Telephone Encounter (Signed)
Outreach made to Pt.  Spoke with Pt's wife.  Wife states she will monitor Pt and try to get an idea of how dizzy or presyncopal Pt is.    Follow up made for Pt with Dr. Lovena Le in [place].  Advised wife to call this nurse sooner if Pt does admit to dizziness or presyncope.

## 2020-09-28 DIAGNOSIS — Z0001 Encounter for general adult medical examination with abnormal findings: Secondary | ICD-10-CM | POA: Diagnosis not present

## 2020-09-28 DIAGNOSIS — I10 Essential (primary) hypertension: Secondary | ICD-10-CM | POA: Diagnosis not present

## 2020-09-28 DIAGNOSIS — Z125 Encounter for screening for malignant neoplasm of prostate: Secondary | ICD-10-CM | POA: Diagnosis not present

## 2020-09-28 DIAGNOSIS — R7301 Impaired fasting glucose: Secondary | ICD-10-CM | POA: Diagnosis not present

## 2020-10-03 DIAGNOSIS — G2581 Restless legs syndrome: Secondary | ICD-10-CM | POA: Diagnosis not present

## 2020-10-03 DIAGNOSIS — I482 Chronic atrial fibrillation, unspecified: Secondary | ICD-10-CM | POA: Diagnosis not present

## 2020-10-03 DIAGNOSIS — E538 Deficiency of other specified B group vitamins: Secondary | ICD-10-CM | POA: Diagnosis not present

## 2020-10-03 DIAGNOSIS — R197 Diarrhea, unspecified: Secondary | ICD-10-CM | POA: Diagnosis not present

## 2020-10-03 DIAGNOSIS — I10 Essential (primary) hypertension: Secondary | ICD-10-CM | POA: Diagnosis not present

## 2020-10-03 DIAGNOSIS — E782 Mixed hyperlipidemia: Secondary | ICD-10-CM | POA: Diagnosis not present

## 2020-10-03 DIAGNOSIS — N401 Enlarged prostate with lower urinary tract symptoms: Secondary | ICD-10-CM | POA: Diagnosis not present

## 2020-10-03 DIAGNOSIS — R7301 Impaired fasting glucose: Secondary | ICD-10-CM | POA: Diagnosis not present

## 2020-10-03 DIAGNOSIS — R059 Cough, unspecified: Secondary | ICD-10-CM | POA: Diagnosis not present

## 2020-10-03 DIAGNOSIS — D696 Thrombocytopenia, unspecified: Secondary | ICD-10-CM | POA: Diagnosis not present

## 2020-10-11 ENCOUNTER — Encounter: Payer: Medicare HMO | Admitting: Dermatology

## 2020-10-16 ENCOUNTER — Other Ambulatory Visit: Payer: Self-pay

## 2020-10-16 ENCOUNTER — Encounter: Payer: Self-pay | Admitting: *Deleted

## 2020-10-16 ENCOUNTER — Encounter: Payer: Self-pay | Admitting: Internal Medicine

## 2020-10-16 ENCOUNTER — Ambulatory Visit: Payer: Medicare HMO | Admitting: Internal Medicine

## 2020-10-16 VITALS — BP 100/58 | HR 86 | Ht 69.0 in | Wt 206.0 lb

## 2020-10-16 DIAGNOSIS — I495 Sick sinus syndrome: Secondary | ICD-10-CM

## 2020-10-16 NOTE — H&P (View-Only) (Signed)
HPI [name] returns today to consider PPM insertion. He is a pleasant 87 yo man with atrial fib and near syncope. When I saw him last I had him wear a cardiac monitor. He presents today to consider PPM insertion. He notes that over the past few weeks, he has had multiple episodes of dizziness and near syncope. He does not have palpitations. He has not passed out. His cardiac monitor shows atrial fib at rates of up to 180/min as well as pauses during the daytime of 3.8 seconds. He is on low dose verapamil. He has palpitations, malaise and fatigue. He gets tired.  No Known Allergies   Current Outpatient Medications  Medication Sig Dispense Refill   acetaminophen (TYLENOL) 500 MG tablet Take 500 mg by mouth. Patient takes 2 by mouth at bedtime for restless leg.     alfuzosin (UROXATRAL) 10 MG 24 hr tablet Take 1 tablet (10 mg total) by mouth daily with breakfast. (Patient taking differently: Take 10 mg by mouth daily.) 90 tablet 3   allopurinol (ZYLOPRIM) 300 MG tablet Take 150 mg by mouth daily.      apixaban (ELIQUIS) 5 MG TABS tablet Take 1 tablet (5 mg total) by mouth 2 (two) times daily. 60 tablet 3   Cholecalciferol (VITAMIN D3) 2000 units TABS Take 3 capsules by mouth daily. 5000 units daily     ipratropium (ATROVENT) 0.06 % nasal spray Place 2 sprays into both nostrils daily as needed for rhinitis.      LACTOBACILLUS PO Take by mouth daily.     Polyethyl Glycol-Propyl Glycol (SYSTANE OP) Apply 1 drop to eye 4 (four) times daily as needed (dry eyes).     simvastatin (ZOCOR) 10 MG tablet Take 1 tablet (10 mg total) by mouth daily. 90 tablet 3   verapamil (CALAN-SR) 120 MG CR tablet TAKE 1 TABLET BY MOUTH TWICE A DAY 90 tablet 3   vitamin B-12 (CYANOCOBALAMIN) 1000 MCG tablet Take 3,000 mcg by mouth daily.      Current Facility-Administered Medications  Medication Dose Route Frequency Provider Last Rate Last Admin   triamcinolone acetonide (KENALOG-40) injection 40 mg  40 mg  Intramuscular Once Juan Monarch, MD         Past Medical History:  Diagnosis Date   Adenomatous colon polyp 2000   Surveillance due 2017   Atrial fibrillation [facility])    Atrial flutter ([place])    a. 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   Left Shoulder (curet)   Nodular basal cell carcinoma 11/11/2017   Right Post Auricular (treatment after biopsy)   Nodular basal cell carcinoma 09/28/2018   Right Sideburn (treatment after biopsy)   Nodular basal cell carcinoma 09/28/2018   Right Front Neck (treatment after biopsy)   SCC (squamous cell carcinoma) 10/26/2014   Right Ear(in situ) (curet, cautery, and 5FU)   SCC (squamous cell carcinoma) 10/26/2014   Right Upper Lip(in situ) (LN2 60 second freeze)   SCC (squamous cell carcinoma) 07/01/2017   Left Sideburn(in situ) (treatment after biopsy)   SCC (squamous cell carcinoma) 07/01/2017   Right Inner Cheek(in situ) (treatment  after biopsy)   SCC (squamous cell carcinoma) 11/11/2017   Right Inner Cheek Inf.(in situ) (treatment after biopsy)   SCC (squamous cell carcinoma) 09/28/2018   Right Helix Mid(in situ) (treatment after biopsy)   SCC (squamous cell carcinoma) 09/28/2018   Right Inner Cheek,Inf(in situ) (treatment after biopsy)   SCC (squamous cell carcinoma) 04/27/2019   Tip of right nose(in situ) - CX3+5FU   Squamous cell carcinoma of skin 10/26/2014   Below Left Ear(in situ) (curet, cautery, and 5FU)   Superficial basal cell carcinoma 10/26/2014   Top Right Foot (treatment after biopsy)   Superficial nodular basal cell carcinoma 10/26/2014   Right Forearm (treatment after biopsy)    ROS:   All systems reviewed and  negative except as noted in the HPI.   Past Surgical History:  Procedure Laterality Date   BIOPSY  10/24/2016   Procedure: BIOPSY;  Surgeon: Juan Dolin, MD;  [facility];  Service: Endoscopy;;  colon   BIOPSY  01/06/2018   Procedure: BIOPSY;  Surgeon: Juan Houston, MD;  [facility];  Service: Endoscopy;;  duodenal   COLONOSCOPY  June 2012   Dr. Gala Quinn: adenomatous polyps, due for surveillance if health permits in [id number]   COLONOSCOPY N/A 10/24/2016   Procedure: COLONOSCOPY;  Surgeon: Juan Dolin, MD;  [facility];  Service: Endoscopy;  Laterality: N/A;  2:00pm   ESOPHAGOGASTRODUODENOSCOPY N/A 05/24/2013   Dr. Rourk:Prepyloric gastric ulcerations-likely source of hematemesis-likely NSAID-related. H.PYLORI SEROLOGY POSITIVE, TREATED WITH PREVPAC   ESOPHAGOGASTRODUODENOSCOPY N/A 05/23/2013   [id number] EGD secondary to much clot and food debris in the stomach   ESOPHAGOGASTRODUODENOSCOPY N/A 08/16/2013   Dr. Gala Quinn: normal esophagus, stomach empty, deformity of antrum. scar present. Previously noted ulcers completed healed. Patent pylorus, normal D1 and D2   ESOPHAGOGASTRODUODENOSCOPY N/A 01/06/2018   Procedure: ESOPHAGOGASTRODUODENOSCOPY (EGD);  Surgeon: Juan Houston, MD;  [facility];  Service: Endoscopy;  Laterality: N/A;   HEMORRHOID SURGERY     POLYPECTOMY  10/24/2016   Procedure: POLYPECTOMY;  Surgeon: Juan Dolin, MD;  [facility];  Service: Endoscopy;;  colon   shoulder       Family History  Problem Relation Age of Onset   Stroke Mother    Stroke Father    Pancreatic cancer Sister    Leukemia Brother    Lung cancer Sister    Colon cancer Neg Hx      Social History   Socioeconomic History   Marital status: Married    Spouse name: Not on file   Number of children: 4   Years of education: Not on file   Highest education level: Not on file  Occupational History   Occupation: retired     Comment: Press photographer  Tobacco Use   Smoking status: Former    Packs/day: 2.00    Years: 50.00    Pack years: 100.00    Types: Cigarettes    Quit date: 07/28/2000    Years since quitting: 20.2   Smokeless tobacco: Former    Quit date: 02/25/1999  Vaping Use   Vaping Use: Never used  Substance and Sexual Activity   Alcohol use: No    Comment: couple glasses wine/week, occ beer or rmixed drink   Drug use: No    Frequency: 2.0 times per week   Sexual activity: Not on file  Other Topics Concern   Not on file  Social History Narrative   Lives w/ wife  Social Determinants of Health   Financial Resource Strain: Not on file  Food Insecurity: Not on file  Transportation Needs: Not on file  Physical Activity: Not on file  Stress: Not on file  Social Connections: Not on file  Intimate Partner Violence: Not on file     BP (!) 100/58   Pulse 86   Ht '5\' 9"'$  (1.753 m)   Wt 206 lb (93.4 kg)   SpO2 96%   BMI 30.42 kg/m   Physical Exam:  Well appearing NAD HEENT: Unremarkable Neck:  No JVD, no thyromegally Lymphatics:  No adenopathy Back:  No CVA tenderness Lungs:  Clear HEART:  Iregular rate rhythm, no murmurs, no rubs, no clicks Abd:  soft, positive bowel sounds, no organomegally, no rebound, no guarding Ext:  2 plus pulses, no edema, no cyanosis, no clubbing Skin:  No rashes no nodules Neuro:  CN II through XII intact, motor grossly intact   A/P  near syncope - I have discussed the treatment options with the patient. He has symptomatic tachy-brady syndrome. I have recommended PPM insertion. The risks/benefits/goals/expectations of the procedure were reviewed with the patient and he will call us if he wishes to proceed. Because he has not passed out, nor had pauses over 4 seconds, I will not take away his driving privileges  2. Atrial fib/flutter - he will be monitored. He will continue his eliquis.    Carleene Overlie Gianlucas Evenson,MD

## 2020-10-16 NOTE — Progress Notes (Signed)
HPI Juan Quinn returns today to consider PPM insertion. He is a pleasant 87 yo man with atrial fib and near syncope. When I saw him last I had him wear a cardiac monitor. He presents today to consider PPM insertion. He notes that over the past few weeks, he has had multiple episodes of dizziness and near syncope. He does not have palpitations. He has not passed out. His cardiac monitor shows atrial fib at rates of up to 180/min as well as pauses during the daytime of 3.8 seconds. He is on low dose verapamil. He has palpitations, malaise and fatigue. He gets tired.  No Known Allergies   Current Outpatient Medications  Medication Sig Dispense Refill   acetaminophen (TYLENOL) 500 MG tablet Take 500 mg by mouth. Patient takes 2 by mouth at bedtime for restless leg.     alfuzosin (UROXATRAL) 10 MG 24 hr tablet Take 1 tablet (10 mg total) by mouth daily with breakfast. (Patient taking differently: Take 10 mg by mouth daily.) 90 tablet 3   allopurinol (ZYLOPRIM) 300 MG tablet Take 150 mg by mouth daily.      apixaban (ELIQUIS) 5 MG TABS tablet Take 1 tablet (5 mg total) by mouth 2 (two) times daily. 60 tablet 3   Cholecalciferol (VITAMIN D3) 2000 units TABS Take 3 capsules by mouth daily. 5000 units daily     ipratropium (ATROVENT) 0.06 % nasal spray Place 2 sprays into both nostrils daily as needed for rhinitis.      LACTOBACILLUS PO Take by mouth daily.     Polyethyl Glycol-Propyl Glycol (SYSTANE OP) Apply 1 drop to eye 4 (four) times daily as needed (dry eyes).     simvastatin (ZOCOR) 10 MG tablet Take 1 tablet (10 mg total) by mouth daily. 90 tablet 3   verapamil (CALAN-SR) 120 MG CR tablet TAKE 1 TABLET BY MOUTH TWICE A DAY 90 tablet 3   vitamin B-12 (CYANOCOBALAMIN) 1000 MCG tablet Take 3,000 mcg by mouth daily.      Current Facility-Administered Medications  Medication Dose Route Frequency Provider Last Rate Last Admin   triamcinolone acetonide (KENALOG-40) injection 40 mg  40 mg  Intramuscular Once Lavonna Monarch, MD         Past Medical History:  Diagnosis Date   Adenomatous colon polyp 2000   Surveillance due 2017   Atrial fibrillation [facility])    Atrial flutter ([place])    a. 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   Left Shoulder (curet)   Nodular basal cell carcinoma 11/11/2017   Right Post Auricular (treatment after biopsy)   Nodular basal cell carcinoma 09/28/2018   Right Sideburn (treatment after biopsy)   Nodular basal cell carcinoma 09/28/2018   Right Front Neck (treatment after biopsy)   SCC (squamous cell carcinoma) 10/26/2014   Right Ear(in situ) (curet, cautery, and 5FU)   SCC (squamous cell carcinoma) 10/26/2014   Right Upper Lip(in situ) (LN2 60 second freeze)   SCC (squamous cell carcinoma) 07/01/2017   Left Sideburn(in situ) (treatment after biopsy)   SCC (squamous cell carcinoma) 07/01/2017   Right Inner Cheek(in situ) (treatment  after biopsy)   SCC (squamous cell carcinoma) 11/11/2017   Right Inner Cheek Inf.(in situ) (treatment after biopsy)   SCC (squamous cell carcinoma) 09/28/2018   Right Helix Mid(in situ) (treatment after biopsy)   SCC (squamous cell carcinoma) 09/28/2018   Right Inner Cheek,Inf(in situ) (treatment after biopsy)   SCC (squamous cell carcinoma) 04/27/2019   Tip of right nose(in situ) - CX3+5FU   Squamous cell carcinoma of skin 10/26/2014   Below Left Ear(in situ) (curet, cautery, and 5FU)   Superficial basal cell carcinoma 10/26/2014   Top Right Foot (treatment after biopsy)   Superficial nodular basal cell carcinoma 10/26/2014   Right Forearm (treatment after biopsy)    ROS:   All systems reviewed and  negative except as noted in the HPI.   Past Surgical History:  Procedure Laterality Date   BIOPSY  10/24/2016   Procedure: BIOPSY;  Surgeon: Daneil Dolin, MD;  [facility];  Service: Endoscopy;;  colon   BIOPSY  01/06/2018   Procedure: BIOPSY;  Surgeon: Rogene Houston, MD;  [facility];  Service: Endoscopy;;  duodenal   COLONOSCOPY  June 2012   Dr. Gala Romney: adenomatous polyps, due for surveillance if health permits in [id number]   COLONOSCOPY N/A 10/24/2016   Procedure: COLONOSCOPY;  Surgeon: Daneil Dolin, MD;  [facility];  Service: Endoscopy;  Laterality: N/A;  2:00pm   ESOPHAGOGASTRODUODENOSCOPY N/A 05/24/2013   Dr. Rourk:Prepyloric gastric ulcerations-likely source of hematemesis-likely NSAID-related. H.PYLORI SEROLOGY POSITIVE, TREATED WITH PREVPAC   ESOPHAGOGASTRODUODENOSCOPY N/A 05/23/2013   [id number] EGD secondary to much clot and food debris in the stomach   ESOPHAGOGASTRODUODENOSCOPY N/A 08/16/2013   Dr. Gala Romney: normal esophagus, stomach empty, deformity of antrum. scar present. Previously noted ulcers completed healed. Patent pylorus, normal D1 and D2   ESOPHAGOGASTRODUODENOSCOPY N/A 01/06/2018   Procedure: ESOPHAGOGASTRODUODENOSCOPY (EGD);  Surgeon: Rogene Houston, MD;  [facility];  Service: Endoscopy;  Laterality: N/A;   HEMORRHOID SURGERY     POLYPECTOMY  10/24/2016   Procedure: POLYPECTOMY;  Surgeon: Daneil Dolin, MD;  [facility];  Service: Endoscopy;;  colon   shoulder       Family History  Problem Relation Age of Onset   Stroke Mother    Stroke Father    Pancreatic cancer Sister    Leukemia Brother    Lung cancer Sister    Colon cancer Neg Hx      Social History   Socioeconomic History   Marital status: Married    Spouse name: Not on file   Number of children: 4   Years of education: Not on file   Highest education level: Not on file  Occupational History   Occupation: retired     Comment: Press photographer  Tobacco Use   Smoking status: Former    Packs/day: 2.00    Years: 50.00    Pack years: 100.00    Types: Cigarettes    Quit date: 07/28/2000    Years since quitting: 20.2   Smokeless tobacco: Former    Quit date: 02/25/1999  Vaping Use   Vaping Use: Never used  Substance and Sexual Activity   Alcohol use: No    Comment: couple glasses wine/week, occ beer or rmixed drink   Drug use: No    Frequency: 2.0 times per week   Sexual activity: Not on file  Other Topics Concern   Not on file  Social History Narrative   Lives w/ wife  Social Determinants of Health   Financial Resource Strain: Not on file  Food Insecurity: Not on file  Transportation Needs: Not on file  Physical Activity: Not on file  Stress: Not on file  Social Connections: Not on file  Intimate Partner Violence: Not on file     BP (!) 100/58   Pulse 86   Ht '5\' 9"'$  (1.753 m)   Wt 206 lb (93.4 kg)   SpO2 96%   BMI 30.42 kg/m   Physical Exam:  Well appearing NAD HEENT: Unremarkable Neck:  No JVD, no thyromegally Lymphatics:  No adenopathy Back:  No CVA tenderness Lungs:  Clear HEART:  Iregular rate rhythm, no murmurs, no rubs, no clicks Abd:  soft, positive bowel sounds, no organomegally, no rebound, no guarding Ext:  2 plus pulses, no edema, no cyanosis, no clubbing Skin:  No rashes no nodules Neuro:  CN II through XII intact, motor grossly intact   A/P  near syncope - I have discussed the treatment options with the patient. He has symptomatic tachy-brady syndrome. I have recommended PPM insertion. The risks/benefits/goals/expectations of the procedure were reviewed with the patient and he will call us if he wishes to proceed. Because he has not passed out, nor had pauses over 4 seconds, I will not take away his driving privileges  2. Atrial fib/flutter - he will be monitored. He will continue his eliquis.    Carleene Overlie Rishav Rockefeller,MD

## 2020-10-16 NOTE — Patient Instructions (Signed)
Medication Instructions:  Your physician recommends that you continue on your current medications as directed. Please refer to the Current Medication list given to you today.  *If you need a refill on your cardiac medications before your next appointment, please call your pharmacy*   Lab Work: Your physician recommends that you return for lab work in: 1 Week before pacemaker placement.   If you have labs (blood work) drawn today and your tests are completely normal, you will receive your results only by: [place] (if you have MyChart) OR A paper copy in the mail If you have any lab test that is abnormal or we need to change your treatment, we will call you to review the results.   Testing/Procedures: Your physician has recommended that you have a pacemaker inserted. A pacemaker is a small device that is placed under the skin of your chest or abdomen to help control abnormal heart rhythms. This device uses electrical pulses to prompt the heart to beat at a normal rate. Pacemakers are used to treat heart rhythms that are too slow. Wire (leads) are attached to the pacemaker that goes into the chambers of you heart. This is done in the hospital and usually requires and overnight stay. Please see the instruction sheet given to you today for more information.    Follow-Up: At [facility] you and your health needs are our priority.  As part of our continuing mission to provide you with exceptional heart care, we have created designated Provider Care Teams.  These Care Teams include your primary Cardiologist (physician) and Advanced Practice Providers (APPs -  Physician Assistants and Nurse Practitioners) who all work together to provide you with the care you need, when you need it.  We recommend signing up for the patient portal called "MyChart".  Sign up information is provided on this After Visit Summary.  MyChart is used to connect with patients for Virtual Visits (Telemedicine).   Patients are able to view lab/test results, encounter notes, upcoming appointments, etc.  Non-urgent messages can be sent to your provider as well.   To learn more about what you can do with MyChart, go to [email or web address].    Your next appointment:    14 Days after pacemaker placement   The format for your next appointment:   In Person  Provider:   Cristopher Peru, MD   Other Instructions Dates for pacemaker placement are: Sept. 15, Sept. 19, Sept. 22    Pacemaker Implantation, Adult, Care After This sheet gives you information about how to care for yourself after your procedure. Your health care provider may also give you more specific instructions. If you have problems or questions, contact your health careprovider. What can I expect after the procedure? After the procedure, it is common to have: Mild pain. Slight bruising. Some swelling over the incisions. A slight bump over the skin where the device was placed (if it was implanted in the upper chest area). Sometimes, it is possible to feel the device under the skin. This is normal. Follow these instructions at home: Medicines Take over-the-counter and prescription medicines only as told by your health care provider. If you were prescribed an antibiotic medicine, take it as told by your health care provider. Do not stop taking the antibiotic even if you start to feel better. Ask your health care provider if the medicine prescribed to you requires you to avoid driving or using machinery. Incision site care  Do not remove the bandage (dressing) on your chest  until you are told to do so by your health care provider. After your dressing is removed, you may see pieces of tape called skin adhesive strips over the incision site. Let the strips fall off on their own. Check your incision area every day for signs of infection. Check for: More redness, swelling, or pain. Fluid or blood. Warmth. Pus or a bad smell. Do not use  lotions or ointments near the incision site unless you are told to do so. Keep the incision area clean and dry for 2-3 days after the procedure or as told by your health care provider. It takes several weeks for the incision site to completely heal. Women may want to place a small pad over the incision site to protect it from their bra strap. Do not take baths, swim, or use a hot tub for 7-10 days or until your health care provider approves. Ask your health care provider if you may take showers. You may only be allowed to take sponge baths.  Activity  If you were given a medicine to help you relax (sedative) during the procedure, it can affect you for several hours. Do not drive or operate machinery until your health care provider says that it is safe. Return to your normal activities as told by your health care provider. Ask your health care provider what activities are safe for you. Do not lift anything that is heavier than 10 lb (4.5 kg), or the limit that you are told, until your health care provider says that it is safe. Avoid sudden jerking, pulling, or chopping movements that pull your upper arm far away from your body. Avoid these movements for at least 6 weeks or as long as told by your health care provider. Do not lift your upper arm above your shoulders for at least 6 weeks or as long as told by your health care provider. This includes activities like tennis, golf, or swimming. You may go back to work when your health care provider says it is okay.  Training and development officer Avoid places or objects that have a strong electric or Research officer, political party, including: Photographer. When at the airport, let officials know that you have a pacemaker. Carry your pacemaker ID card. Metal detectors. If you must pass through a metal detector, walk through it quickly. Do not stop under the detector or stand near it. Power plants. Large electrical generators. Radiofrequency  transmission towers, such as mobile phone and radio towers. Do not use amateur Chief of Staff. If you are unsure of whether something is safe to use, ask your health care provider. Some devices may be safe to use if you hold them at least 1 ft (0.3 m) from your pacemaker. These devices may include power tools, lawn mowers, and speakers. When using your mobile phone, hold it to the ear opposite the pacemaker. Do not leave your mobile phone in a pocket over the pacemaker. Long-term care You may be shown how to transfer data from your pacemaker through the phone to your health care provider. Always let all health care providers, including dentists, know about your pacemaker before you have any medical procedures or tests. Wear a medical ID bracelet or necklace stating that you have a pacemaker. Carry a pacemaker ID card with you at all times. Your pacemaker battery will last for 5-15 years. Your health care provider will do routine checks to know when the battery is starting to run down. When this happens, the  pacemaker will need to be replaced. General instructions Do not use any products that contain nicotine or tobacco, such as cigarettes, e-cigarettes, and chewing tobacco. These can delay incision healing after surgery. If you need help quitting, ask your health care provider. Follow instructions from your health care provider about eating or drinking restrictions. Weigh yourself every day. If you suddenly gain weight, fluid may be building up in your body. Keep all follow-up visits as told by your health care provider. This is important. During follow-up visits, your pacemaker will be checked and reprogrammed if necessary. Contact a health care provider if: You gain weight suddenly. Your legs or feet swell. It feels like your heart is fluttering or skipping beats (you have heart palpitations). You have any of these signs of infection: More redness, swelling, or pain  around an incision. Fluid or blood coming from an incision. Warmth coming from an incision. Pus or a bad smell coming from an incision. A fever or chills. Get help right away if: You have chest pain. You have trouble breathing or are short of breath. You become extremely tired. You are light-headed or you faint. These symptoms may represent a serious problem that is an emergency. Do not wait to see if the symptoms will go away. Get medical help right away. Call your local emergency services (911 in the [place] Do not drive yourself to the hospital. Summary After the procedure, it is common to have mild pain, swelling, or bruising over the incision. Take over-the-counter and prescription medicines only as told by your health care provider. Do not raise your arm above your shoulder or lift anything that is heavier than 10 lb (4.5 kg), or the limit that you are told, until your health care provider says that it is safe. Carry a pacemaker ID card with you at all times. This information is not intended to replace advice given to you by your health care provider. Make sure you discuss any questions you have with your healthcare provider. Document Revised: 01/12/2019 Document Reviewed: 01/12/2019 Elsevier Patient Education  2022 [employer].

## 2020-10-16 NOTE — Addendum Note (Signed)
Addended by: Levonne Hubert on: 10/16/2020 01:26 PM   Modules accepted: Orders

## 2020-10-18 ENCOUNTER — Encounter: Payer: Medicare HMO | Admitting: Dermatology

## 2020-10-20 ENCOUNTER — Other Ambulatory Visit: Payer: Self-pay | Admitting: Student

## 2020-10-22 NOTE — Telephone Encounter (Signed)
Pt last saw Dr Lovena Le 10/16/20, last labs 09/29/20 Creat 1.06, age 87, weight 93.4kg, based on specified criteria pt is on appropriate dosage of Eliquis '5mg'$  BID for afib.  Will refill rx.

## 2020-11-08 ENCOUNTER — Other Ambulatory Visit: Payer: Self-pay

## 2020-11-08 ENCOUNTER — Other Ambulatory Visit (HOSPITAL_COMMUNITY)
Admission: RE | Admit: 2020-11-08 | Discharge: 2020-11-08 | Disposition: A | Discharge status: Home / Self Care | Payer: Medicare HMO | Source: Ambulatory Visit | Attending: Internal Medicine | Admitting: Internal Medicine

## 2020-11-08 DIAGNOSIS — I495 Sick sinus syndrome: Secondary | ICD-10-CM | POA: Diagnosis not present

## 2020-11-08 LAB — BASIC METABOLIC PANEL
Anion gap: 6 (ref 5–15)
BUN: 19 mg/dL (ref 8–23)
CO2: 27 mmol/L (ref 22–32)
Calcium: 9.6 mg/dL (ref 8.9–10.3)
Chloride: 108 mmol/L (ref 98–111)
Creatinine, Ser: 0.99 mg/dL (ref 0.61–1.24)
GFR, Estimated: >60 mL/min (ref >60)
Glucose, Bld: 100 mg/dL — ABNORMAL HIGH (ref 70–99)
Potassium: 4.1 mmol/L (ref 3.5–5.1)
Sodium: 141 mmol/L (ref 135–145)

## 2020-11-08 LAB — CBC
HCT: 40.8 % (ref 39.0–52.0)
Hemoglobin: 13.7 g/dL (ref 13.0–17.0)
MCH: 36.5 pg — ABNORMAL HIGH (ref 26.0–34.0)
MCHC: 33.6 g/dL (ref 30.0–36.0)
MCV: 108.8 fL — ABNORMAL HIGH (ref 80.0–100.0)
Platelets: 143 10*3/uL — ABNORMAL LOW (ref 150–400)
RBC: 3.75 MIL/uL — ABNORMAL LOW (ref 4.22–5.81)
RDW: 12 % (ref 11.5–15.5)
WBC: 6.7 10*3/uL (ref 4.0–10.5)
nRBC: 0 % (ref 0.0–0.2)

## 2020-11-15 ENCOUNTER — Other Ambulatory Visit: Payer: Self-pay

## 2020-11-15 ENCOUNTER — Ambulatory Visit (HOSPITAL_COMMUNITY)
Admission: RE | Disposition: A | Discharge status: Home / Self Care | Payer: Self-pay | Source: Home / Self Care | Attending: Internal Medicine

## 2020-11-15 ENCOUNTER — Ambulatory Visit (HOSPITAL_COMMUNITY): Payer: Medicare HMO

## 2020-11-15 ENCOUNTER — Ambulatory Visit (HOSPITAL_COMMUNITY)
Admission: RE | Admit: 2020-11-15 | Discharge: 2020-11-15 | Disposition: A | Discharge status: Home / Self Care | Payer: Medicare HMO | Attending: Internal Medicine | Admitting: Internal Medicine

## 2020-11-15 DIAGNOSIS — I4892 Unspecified atrial flutter: Secondary | ICD-10-CM | POA: Diagnosis not present

## 2020-11-15 DIAGNOSIS — R55 Syncope and collapse: Secondary | ICD-10-CM | POA: Diagnosis not present

## 2020-11-15 DIAGNOSIS — I495 Sick sinus syndrome: Secondary | ICD-10-CM | POA: Diagnosis not present

## 2020-11-15 DIAGNOSIS — I442 Atrioventricular block, complete: Secondary | ICD-10-CM | POA: Insufficient documentation

## 2020-11-15 DIAGNOSIS — I441 Atrioventricular block, second degree: Secondary | ICD-10-CM | POA: Diagnosis not present

## 2020-11-15 DIAGNOSIS — Z87891 Personal history of nicotine dependence: Secondary | ICD-10-CM | POA: Diagnosis not present

## 2020-11-15 DIAGNOSIS — I4891 Unspecified atrial fibrillation: Secondary | ICD-10-CM | POA: Diagnosis not present

## 2020-11-15 DIAGNOSIS — Z79899 Other long term (current) drug therapy: Secondary | ICD-10-CM | POA: Insufficient documentation

## 2020-11-15 DIAGNOSIS — Z950 Presence of cardiac pacemaker: Secondary | ICD-10-CM

## 2020-11-15 DIAGNOSIS — I700 Atherosclerosis of aorta: Secondary | ICD-10-CM | POA: Diagnosis not present

## 2020-11-15 DIAGNOSIS — Z7901 Long term (current) use of anticoagulants: Secondary | ICD-10-CM | POA: Diagnosis not present

## 2020-11-15 HISTORY — PX: PACEMAKER IMPLANT: EP1218

## 2020-11-15 SURGERY — PACEMAKER IMPLANT

## 2020-11-15 MED ORDER — HEPARIN (PORCINE) IN NACL 1000-0.9 UT/500ML-% IV SOLN
INTRAVENOUS | Status: DC | PRN
  Administered 2020-11-15: 500 mL

## 2020-11-15 MED ORDER — POVIDONE-IODINE 10 % EX SWAB: 2.0000 "application " | Freq: Once | CUTANEOUS | Status: DC

## 2020-11-15 MED ORDER — MIDAZOLAM HCL 5 MG/5ML IJ SOLN
INTRAMUSCULAR | Status: AC
  Filled 2020-11-15: qty 5

## 2020-11-15 MED ORDER — LIDOCAINE HCL 1 % IJ SOLN
INTRAMUSCULAR | Status: AC
  Filled 2020-11-15: qty 60

## 2020-11-15 MED ORDER — CEFAZOLIN SODIUM-DEXTROSE 2-4 GM/100ML-% IV SOLN
INTRAVENOUS | Status: AC
  Filled 2020-11-15: qty 100

## 2020-11-15 MED ORDER — LIDOCAINE HCL (PF) 1 % IJ SOLN
INTRAMUSCULAR | Status: DC | PRN
  Administered 2020-11-15: 60 mL

## 2020-11-15 MED ORDER — FENTANYL CITRATE (PF) 100 MCG/2ML IJ SOLN
INTRAMUSCULAR | Status: DC | PRN
  Administered 2020-11-15 (×2): 12.5 ug via INTRAVENOUS

## 2020-11-15 MED ORDER — ONDANSETRON HCL 4 MG/2ML IJ SOLN
4.0000 mg | Freq: Four times a day (QID) | INTRAMUSCULAR | Status: DC | PRN
Start: 2020-11-15 — End: 2020-11-15

## 2020-11-15 MED ORDER — SODIUM CHLORIDE 0.9 % IV SOLN
80.0000 mg | INTRAVENOUS | Status: AC
  Administered 2020-11-15: 80 mg
  Filled 2020-11-15: qty 2

## 2020-11-15 MED ORDER — CEFAZOLIN SODIUM-DEXTROSE 1-4 GM/50ML-% IV SOLN
1.0000 g | Freq: Once | INTRAVENOUS | Status: AC
  Administered 2020-11-15: 1 g via INTRAVENOUS

## 2020-11-15 MED ORDER — SODIUM CHLORIDE 0.9 % IV SOLN: INTRAVENOUS | Status: DC

## 2020-11-15 MED ORDER — FENTANYL CITRATE (PF) 100 MCG/2ML IJ SOLN
INTRAMUSCULAR | Status: AC
  Filled 2020-11-15: qty 2

## 2020-11-15 MED ORDER — CEFAZOLIN SODIUM-DEXTROSE 1-4 GM/50ML-% IV SOLN
INTRAVENOUS | Status: AC
  Filled 2020-11-15: qty 50

## 2020-11-15 MED ORDER — CEFAZOLIN SODIUM-DEXTROSE 2-4 GM/100ML-% IV SOLN
2.0000 g | INTRAVENOUS | Status: AC
  Administered 2020-11-15: 2 g via INTRAVENOUS
  Filled 2020-11-15: qty 100

## 2020-11-15 MED ORDER — ACETAMINOPHEN 325 MG PO TABS
325.0000 mg | ORAL_TABLET | ORAL | Status: DC | PRN
  Filled 2020-11-15: qty 2

## 2020-11-15 MED ORDER — SODIUM CHLORIDE 0.9 % IV SOLN
INTRAVENOUS | Status: AC
  Filled 2020-11-15: qty 2

## 2020-11-15 MED ORDER — MIDAZOLAM HCL 5 MG/5ML IJ SOLN
INTRAMUSCULAR | Status: DC | PRN
  Administered 2020-11-15 (×2): 1 mg via INTRAVENOUS

## 2020-11-15 MED ORDER — HEPARIN (PORCINE) IN NACL 1000-0.9 UT/500ML-% IV SOLN
INTRAVENOUS | Status: AC
  Filled 2020-11-15: qty 500

## 2020-11-15 MED ORDER — CHLORHEXIDINE GLUCONATE 4 % EX LIQD
4.0000 "application " | Freq: Once | CUTANEOUS | Status: DC
  Filled 2020-11-15: qty 60

## 2020-11-15 SURGICAL SUPPLY — 10 items
CABLE SURGICAL S-101-97-12 (CABLE) ×2 IMPLANT
CATH SELECT PACE 669183 (CATHETERS) ×1 IMPLANT
CUTTER LV DELIVERY CATHETER 7 (MISCELLANEOUS) ×1 IMPLANT
LEAD TENDRIL MRI 52CM LPA1200M (Lead) ×1 IMPLANT
LEAD TENDRIL STS 2088TC-65 (Lead) ×1 IMPLANT
PACEMAKER ASSURITY DR-RF (Pacemaker) ×1 IMPLANT
PAD PRO RADIOLUCENT 2001M-C (PAD) ×2 IMPLANT
SHEATH 8FR PRELUDE SNAP 13 (SHEATH) ×2 IMPLANT
TRAY PACEMAKER INSERTION (PACKS) ×2 IMPLANT
WIRE HI TORQ VERSACORE-J 145CM (WIRE) ×1 IMPLANT

## 2020-11-15 NOTE — Progress Notes (Signed)
Pt ambulated without difficulty or bleeding.   Discharged home with his wife who will drive and stay with pt x 24 hrs. 

## 2020-11-15 NOTE — Interval H&P Note (Signed)
History and Physical Interval Note:  11/15/2020 8:11 AM  Juan Quinn  has presented today for surgery, with the diagnosis of tachibradie.  The various methods of treatment have been discussed with the patient and family. After consideration of risks, benefits and other options for treatment, the patient has consented to  Procedure(s): PACEMAKER IMPLANT (N/A) as a surgical intervention.  The patient's history has been reviewed, patient examined, no change in status, stable for surgery.  I have reviewed the patient's chart and labs.  Questions were answered to the patient's satisfaction.     Cristopher Peru

## 2020-11-15 NOTE — Discharge Instructions (Addendum)
After Your Pacemaker   You have a St. Jude Pacemaker  ACTIVITY Do not lift your arm above shoulder height for 1 week after your procedure. After 7 days, you may progress as below.  You should remove your sling 24 hours after your procedure, unless otherwise instructed by your provider.     Thursday November 22, 2020  Friday November 23, 2020 Saturday November 24, 2020 Sunday November 25, 2020   Do not lift, push, pull, or carry anything over 10 pounds with the affected arm until 6 weeks (Thursday December 27, 2020 ) after your procedure.   You may drive AFTER your wound check, unless you have been told otherwise by your provider.   Ask your healthcare provider when you can go back to work   INCISION/Dressing If you are on a blood thinner such as Coumadin, Xarelto, Eliquis, Plavix, or Pradaxa please confirm with your provider when this should be resumed. Resume 11-19-20  If large square, outer bandage is left in place, this can be removed after 24 hours from your procedure. Do not remove steri-strips or glue as below.   Monitor your Pacemaker site for redness, swelling, and drainage. Call the device clinic at [phone] if you experience these symptoms or fever/chills.  If your incision is sealed with Steri-strips or staples, you may shower 10 days after your procedure or when told by your provider. Do not remove the steri-strips or let the shower hit directly on your site. You may wash around your site with soap and water.    Avoid lotions, ointments, or perfumes over your incision until it is well-healed.  You may use a hot tub or a pool AFTER your wound check appointment if the incision is completely closed.  PAcemaker Alerts:  Some alerts are vibratory and others beep. These are NOT emergencies. Please call our office to let us know. If this occurs at night or on weekends, it can wait until the next business day. Send a remote transmission.  If your device is capable of reading  fluid status (for heart failure), you will be offered monthly monitoring to review this with you.   DEVICE MANAGEMENT Remote monitoring is used to monitor your pacemaker from home. This monitoring is scheduled every 91 days by our office. It allows [place] to keep an eye on the functioning of your device to ensure it is working properly. You will routinely see your Electrophysiologist annually (more often if necessary).   You should receive your ID card for your new device in 4-8 weeks. Keep this card with you at all times once received. Consider wearing a medical alert bracelet or necklace.  Your Pacemaker may be MRI compatible. This will be discussed at your next office visit/wound check.  You should avoid contact with strong electric or magnetic fields.   Do not use amateur (ham) radio equipment or electric (arc) welding torches. MP3 player headphones with magnets should not be used. Some devices are safe to use if held at least 12 inches (30 cm) from your Pacemaker. These include power tools, lawn mowers, and speakers. If you are unsure if something is safe to use, ask your health care provider.  When using your cell phone, hold it to the ear that is on the opposite side from the Pacemaker. Do not leave your cell phone in a pocket over the Pacemaker.  You may safely use electric blankets, heating pads, computers, and microwave ovens.  Call the office right away if: You have chest pain.  You feel more short of breath than you have felt before. You feel more light-headed than you have felt before. Your incision starts to open up.  This information is not intended to replace advice given to you by your health care provider. Make sure you discuss any questions you have with your health care provider.

## 2020-11-16 ENCOUNTER — Telehealth: Payer: Self-pay

## 2020-11-16 ENCOUNTER — Encounter (HOSPITAL_COMMUNITY): Payer: Self-pay | Admitting: Internal Medicine

## 2020-11-16 MED FILL — Lidocaine HCl Local Inj 1%: INTRAMUSCULAR | Qty: 20 | Status: AC

## 2020-11-16 NOTE — Telephone Encounter (Signed)
-----   Message from [place], [place] sent at 11/15/2020  2:33 PM EDT ----- Same day d/c  [facility] PPM  GT

## 2020-11-16 NOTE — Telephone Encounter (Signed)
Follow-up after same day discharge: Implant date: 11/15/20 MD: Cristopher Peru, MD Device: St. Jude Assurity MRI Location: Left Chest Wound check visit: 11/29/20 90 day MD follow-up: 02/27/20  Remote Transmission received: Yes  Dressing removed: Yes. No complaints.

## 2020-11-28 ENCOUNTER — Ambulatory Visit: Payer: Medicare HMO

## 2020-11-29 ENCOUNTER — Other Ambulatory Visit: Payer: Self-pay

## 2020-11-29 ENCOUNTER — Ambulatory Visit (INDEPENDENT_AMBULATORY_CARE_PROVIDER_SITE_OTHER): Payer: Medicare HMO

## 2020-11-29 DIAGNOSIS — I495 Sick sinus syndrome: Secondary | ICD-10-CM

## 2020-11-29 LAB — CUP PACEART INCLINIC DEVICE CHECK
Battery Remaining Longevity: 140 mo
Battery Voltage: 3.08 V
Brady Statistic RA Percent Paced: 0.28 %
Brady Statistic RV Percent Paced: 2.2 %
Date Time Interrogation Session: 20221006142900
Implantable Lead Implant Date: 20220922
Implantable Lead Implant Date: 20220922
Implantable Lead Location: 753859
Implantable Lead Location: 753860
Implantable Pulse Generator Implant Date: 20220922
Lead Channel Impedance Value: 462.5 Ohm
Lead Channel Impedance Value: 562.5 Ohm
Lead Channel Pacing Threshold Amplitude: 0.5 V
Lead Channel Pacing Threshold Amplitude: 0.5 V
Lead Channel Pacing Threshold Amplitude: 1.25 V
Lead Channel Pacing Threshold Amplitude: 1.25 V
Lead Channel Pacing Threshold Pulse Width: 0.5 ms
Lead Channel Pacing Threshold Pulse Width: 0.5 ms
Lead Channel Pacing Threshold Pulse Width: 0.5 ms
Lead Channel Pacing Threshold Pulse Width: 0.5 ms
Lead Channel Sensing Intrinsic Amplitude: 10.5 mV
Lead Channel Sensing Intrinsic Amplitude: 5 mV
Lead Channel Setting Pacing Amplitude: 3.5 V
Lead Channel Setting Pacing Amplitude: 3.5 V
Lead Channel Setting Pacing Pulse Width: 0.5 ms
Lead Channel Setting Sensing Sensitivity: 2 mV
Pulse Gen Model: 2272
Pulse Gen Serial Number: 3964503

## 2020-11-29 NOTE — Progress Notes (Signed)
Wound check appointment. Steri-strips removed. Wound without redness or edema. Incision edges approximated, wound well healed. Normal device function. Thresholds, sensing, and impedances consistent with implant measurements. Device programmed at 3.5V programmed on for extra safety margin until 3 month visit. 64 AMS AT/AF burden 56%. Longest episode on 11/21/20 35hrs in duration, V rates 50-150bpm + Eliquis. Histogram distribution appropriate for patient and level of activity. Patient educated about wound care, arm mobility, lifting restrictions. ROV 02/27/20 with GT.

## 2020-11-29 NOTE — Patient Instructions (Signed)

## 2020-12-06 ENCOUNTER — Other Ambulatory Visit: Payer: Self-pay | Admitting: Internal Medicine

## 2020-12-06 NOTE — Telephone Encounter (Signed)
This is a [place] pt.  °

## 2020-12-11 ENCOUNTER — Telehealth: Payer: Self-pay | Admitting: Internal Medicine

## 2020-12-11 ENCOUNTER — Ambulatory Visit: Payer: Medicare HMO | Admitting: Urology

## 2020-12-11 ENCOUNTER — Other Ambulatory Visit: Payer: Self-pay

## 2020-12-11 ENCOUNTER — Encounter: Payer: Self-pay | Admitting: Urology

## 2020-12-11 VITALS — BP 129/58 | HR 44

## 2020-12-11 DIAGNOSIS — N138 Other obstructive and reflux uropathy: Secondary | ICD-10-CM | POA: Diagnosis not present

## 2020-12-11 DIAGNOSIS — R351 Nocturia: Secondary | ICD-10-CM | POA: Diagnosis not present

## 2020-12-11 DIAGNOSIS — N401 Enlarged prostate with lower urinary tract symptoms: Secondary | ICD-10-CM

## 2020-12-11 LAB — URINALYSIS, ROUTINE W REFLEX MICROSCOPIC
Bilirubin, UA: NEGATIVE
Glucose, UA: NEGATIVE
Ketones, UA: NEGATIVE
Leukocytes,UA: NEGATIVE
Nitrite, UA: NEGATIVE
Protein,UA: NEGATIVE
RBC, UA: NEGATIVE
Specific Gravity, UA: 1.015 (ref 1.005–1.030)
Urobilinogen, Ur: 0.2 mg/dL (ref 0.2–1.0)
pH, UA: 6 (ref 5.0–7.5)

## 2020-12-11 MED ORDER — ALFUZOSIN HCL ER 10 MG PO TB24: 10.0000 mg | ORAL_TABLET | Freq: Every day | ORAL | 3 refills | Status: DC

## 2020-12-11 NOTE — Progress Notes (Signed)
12/11/2020 1:50 PM   Juan Quinn Sep 29, 1930 [id number]  Referring provider: Lucia Gaskins, MD [place],  [place] [address]  Followup BPh and nocturia   HPI: Mr Argabright is a 87yo here for followup for BPH and nocturia. IPSS 3 QOl 2. Nocturia 1x. Urine stream strong. No straining to urinate. No dysuria or hematuria. UA normal. He is on uroxatral 10mg  qhs. No other complaints today.    PMH: Past Medical History:  Diagnosis Date   Adenomatous colon polyp 2000   Surveillance due 2017   Atrial fibrillation [facility])    Atrial flutter ([place])    a. 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   Left Shoulder (curet)   Nodular basal cell carcinoma 11/11/2017   Right Post Auricular (treatment after biopsy)   Nodular basal cell carcinoma 09/28/2018   Right Sideburn (treatment after biopsy)   Nodular basal cell carcinoma 09/28/2018   Right Front Neck (treatment after biopsy)   SCC (squamous cell carcinoma) 10/26/2014   Right Ear(in situ) (curet, cautery, and 5FU)   SCC (squamous cell carcinoma) 10/26/2014   Right Upper Lip(in situ) (LN2 60 second freeze)   SCC (squamous cell carcinoma) 07/01/2017   Left Sideburn(in situ) (treatment after biopsy)   SCC (squamous cell carcinoma) 07/01/2017   Right Inner Cheek(in situ) (treatment after biopsy)   SCC (squamous cell carcinoma) 11/11/2017   Right Inner Cheek Inf.(in situ) (treatment after biopsy)   SCC (squamous cell carcinoma) 09/28/2018   Right Helix Mid(in situ) (treatment after biopsy)   SCC (squamous cell carcinoma) 09/28/2018   Right Inner Cheek,Inf(in situ) (treatment after biopsy)   SCC (squamous cell carcinoma)  04/27/2019   Tip of right nose(in situ) - CX3+5FU   Squamous cell carcinoma of skin 10/26/2014   Below Left Ear(in situ) (curet, cautery, and 5FU)   Superficial basal cell carcinoma 10/26/2014   Top Right Foot (treatment after biopsy)   Superficial nodular basal cell carcinoma 10/26/2014   Right Forearm (treatment after biopsy)    Surgical History: Past Surgical History:  Procedure Laterality Date   BIOPSY  10/24/2016   Procedure: BIOPSY;  Surgeon: Daneil Dolin, MD;  [facility];  Service: Endoscopy;;  colon   BIOPSY  01/06/2018   Procedure: BIOPSY;  Surgeon: Rogene Houston, MD;  Location: [facility];  Service: Endoscopy;;  duodenal   COLONOSCOPY  June 2012   Dr. Gala Romney: adenomatous polyps, due for surveillance if health permits in 2536   COLONOSCOPY N/A 10/24/2016   Procedure: COLONOSCOPY;  Surgeon: Daneil Dolin, MD;  Location: [facility];  Service: Endoscopy;  Laterality: N/A;  2:00pm   ESOPHAGOGASTRODUODENOSCOPY N/A 05/24/2013   Dr. Rourk:Prepyloric gastric ulcerations-likely source of hematemesis-likely NSAID-related. H.PYLORI SEROLOGY POSITIVE, TREATED WITH PREVPAC   ESOPHAGOGASTRODUODENOSCOPY N/A 05/23/2013   UYQ:[id number] EGD secondary to much clot and food debris in the stomach   ESOPHAGOGASTRODUODENOSCOPY N/A 08/16/2013   Dr. Gala Romney: normal esophagus, stomach empty, deformity of antrum. scar present. Previously noted ulcers completed healed. Patent pylorus, normal D1 and D2   ESOPHAGOGASTRODUODENOSCOPY N/A 01/06/2018   Procedure: ESOPHAGOGASTRODUODENOSCOPY (EGD);  Surgeon: Hildred Laser  U, MD;  Location[facility];  Service: Endoscopy;  Laterality: N/A;   HEMORRHOID SURGERY     PACEMAKER IMPLANT N/A 11/15/2020   Procedure: PACEMAKER IMPLANT;  Surgeon: Evans Lance, MD;  Location: [place] [facility];  Service: Cardiovascular;  Laterality: N/A;   POLYPECTOMY  10/24/2016   Procedure: POLYPECTOMY;  Surgeon: Daneil Dolin, MD;  [facility];   Service: Endoscopy;;  colon   shoulder      Home Medications:  Allergies as of 12/11/2020   No Known Allergies      Medication List        Accurate as of December 11, 2020  1:50 PM. If you have any questions, ask your nurse or doctor.          acetaminophen 500 MG tablet Commonly known as: TYLENOL Take 1,000 mg by mouth at bedtime. May take additional  1000 mg of needed   alfuzosin 10 MG 24 hr tablet Commonly known as: UROXATRAL Take 1 tablet (10 mg total) by mouth daily with breakfast. What changed: when to take this   allopurinol 300 MG tablet Commonly known as: ZYLOPRIM Take 150 mg by mouth daily.   Eliquis 5 MG Tabs tablet Generic drug: apixaban TAKE 1 TABLET BY MOUTH TWICE A DAY   ipratropium 0.06 % nasal spray Commonly known as: ATROVENT Place 2 sprays into both nostrils daily as needed for rhinitis.   LACTOBACILLUS PO Take 1 tablet by mouth daily.   rOPINIRole 0.5 MG tablet Commonly known as: REQUIP Take 1 mg by mouth at bedtime.   simvastatin 10 MG tablet Commonly known as: ZOCOR Take 1 tablet (10 mg total) by mouth daily.   SYSTANE OP Apply 1 drop to eye 4 (four) times daily as needed (dry eyes).   verapamil 120 MG CR tablet Commonly known as: CALAN-SR TAKE 1 TABLET BY MOUTH TWICE A DAY   vitamin B-12 1000 MCG tablet Commonly known as: CYANOCOBALAMIN Take 3,000 mcg by mouth daily.   Vitamin D3 50 MCG (2000 UT) Tabs Take 6,000 Units by mouth daily.        Allergies: No Known Allergies  Family History: Family History  Problem Relation Age of Onset   Stroke Mother    Stroke Father    Pancreatic cancer Sister    Leukemia Brother    Lung cancer Sister    Colon cancer Neg Hx     Social History:  reports that he quit smoking about 20 years ago. His smoking use included cigarettes. He has a 100.00 pack-year smoking history. He quit smokeless tobacco use about 21 years ago. He reports that he does not drink alcohol and does not use  drugs.  ROS: All other review of systems were reviewed and are negative except what is noted above in HPI  Physical Exam: BP (!) 129/58   Pulse (!) 44   Constitutional:  Alert and oriented, No acute distress. HEENT: [place] AT, moist mucus membranes.  Trachea midline, no masses. Cardiovascular: No clubbing, cyanosis, or edema. Respiratory: Normal respiratory effort, no increased work of breathing. GI: Abdomen is soft, nontender, nondistended, no abdominal masses GU: No CVA tenderness.  Lymph: No cervical or inguinal lymphadenopathy. Skin: No rashes, bruises or suspicious lesions. Neurologic: Grossly intact, no focal deficits, moving all 4 extremities. Psychiatric: Normal mood and affect.  Laboratory Data: Lab Results  Component Value Date   WBC 6.7 11/08/2020   HGB 13.7 11/08/2020   HCT 40.8 11/08/2020   MCV 108.8 (  H) 11/08/2020   PLT 143 (L) 11/08/2020    Lab Results  Component Value Date   CREATININE 0.99 11/08/2020    No results found for: PSA  No results found for: TESTOSTERONE  No results found for: HGBA1C  Urinalysis    Component Value Date/Time   APPEARANCEUR Clear 12/08/2019 1142   GLUCOSEU Negative 12/08/2019 1142   BILIRUBINUR Negative 12/08/2019 1142   PROTEINUR Negative 12/08/2019 1142   UROBILINOGEN negative (A) 09/07/2019 1109   NITRITE Negative 12/08/2019 1142   LEUKOCYTESUR Negative 12/08/2019 1142    Lab Results  Component Value Date   LABMICR Comment 12/08/2019    Pertinent Imaging:  No results found for this or any previous visit.  No results found for this or any previous visit.  No results found for this or any previous visit.  No results found for this or any previous visit.  No results found for this or any previous visit.  No results found for this or any previous visit.  No results found for this or any previous visit.  No results found for this or any previous visit.   Assessment & Plan:    1. Benign prostatic  hyperplasia with urinary obstruction -Continue uroxatral 10mg  qhs - Urinalysis, Routine w reflex microscopic  2. Nocturia Continue uroxatral 10mg  qhs   No follow-ups on file.  Nicolette Bang, MD  [facility] [place]

## 2020-12-11 NOTE — Telephone Encounter (Signed)
New message    Patient was just at Dr Alyson Ingles office and they checked the patients hr and it was 40 and 33 , they wanted patient to contact DR Lovena Le

## 2020-12-11 NOTE — Progress Notes (Signed)
Urological Symptom Review  Patient is experiencing the following symptoms: Get up at night to urinate   Review of Systems  Gastrointestinal (upper)  : Negative for upper GI symptoms  Gastrointestinal (lower) : Negative for lower GI symptoms  Constitutional : Fatigue  Skin: Negative for skin symptoms  Eyes: Negative for eye symptoms  Ear/Nose/Throat : Negative for Ear/Nose/Throat symptoms  Hematologic/Lymphatic: Easy bruising  Cardiovascular : Recent pacemaker placed 3 weeks. MD notify of low HR   Respiratory : Cough  Endocrine: Negative for endocrine symptoms  Musculoskeletal: Negative for musculoskeletal symptoms  Neurological: Negative for neurological symptoms  Psychologic: Negative for psychiatric symptoms

## 2020-12-11 NOTE — Telephone Encounter (Signed)
Spoke with wife who states that while at another doctor's office today they were told to stop by Dr. Tanna Furry office in [place] d/t pt's HR being between 44-48. Wife will check pt BP and HR and call office back when she gets home.

## 2020-12-11 NOTE — Patient Instructions (Signed)
Benign Prostatic Hyperplasia Benign prostatic hyperplasia (BPH) is an enlarged prostate gland that is caused by the normal aging process and not by cancer. The prostate is a walnut-sized gland that is involved in the production of semen. It is located in front of the rectum and below the bladder. The bladder stores urine and the urethra is the tube that carries the urine out of the body. The prostate may get bigger as a man gets older. An enlarged prostate can press on the urethra. This can make it harder to pass urine. The build-up of urine in the bladder can cause infection. Back pressure and infection may progress to bladder damage and kidney (renal) failure. What are the causes? This condition is part of a normal aging process. However, not all men develop problems from this condition. If the prostate enlarges away from the urethra, urine flow will not be blocked. If it enlarges toward the urethra and compresses it, there will be problems passing urine. What increases the risk? This condition is more likely to develop in men over the age of 50 years. What are the signs or symptoms? Symptoms of this condition include: Getting up often during the night to urinate. Needing to urinate frequently during the day. Difficulty starting urine flow. Decrease in size and strength of your urine stream. Leaking (dribbling) after urinating. Inability to pass urine. This needs immediate treatment. Inability to completely empty your bladder. Pain when you pass urine. This is more common if there is also an infection. Urinary tract infection (UTI). How is this diagnosed? This condition is diagnosed based on your medical history, a physical exam, and your symptoms. Tests will also be done, such as: A post-void bladder scan. This measures any amount of urine that may remain in your bladder after you finish urinating. A digital rectal exam. In a rectal exam, your health care provider checks your prostate by  putting a lubricated, gloved finger into your rectum to feel the back of your prostate gland. This exam detects the size of your gland and any abnormal lumps or growths. An exam of your urine (urinalysis). A prostate specific antigen (PSA) screening. This is a blood test used to screen for prostate cancer. An ultrasound. This test uses sound waves to electronically produce a picture of your prostate gland. Your health care provider may refer you to a specialist in kidney and prostate diseases (urologist). How is this treated? Once symptoms begin, your health care provider will monitor your condition (active surveillance or watchful waiting). Treatment for this condition will depend on the severity of your condition. Treatment may include: Observation and yearly exams. This may be the only treatment needed if your condition and symptoms are mild. Medicines to relieve your symptoms, including: Medicines to shrink the prostate. Medicines to relax the muscle of the prostate. Surgery in severe cases. Surgery may include: Prostatectomy. In this procedure, the prostate tissue is removed completely through an open incision or with a laparoscope or robotics. Transurethral resection of the prostate (TURP). In this procedure, a tool is inserted through the opening at the tip of the penis (urethra). It is used to cut away tissue of the inner core of the prostate. The pieces are removed through the same opening of the penis. This removes the blockage. Transurethral incision (TUIP). In this procedure, small cuts are made in the prostate. This lessens the prostate's pressure on the urethra. Transurethral microwave thermotherapy (TUMT). This procedure uses microwaves to create heat. The heat destroys and removes a   small amount of prostate tissue. Transurethral needle ablation (TUNA). This procedure uses radio frequencies to destroy and remove a small amount of prostate tissue. Interstitial laser coagulation (ILC).  This procedure uses a laser to destroy and remove a small amount of prostate tissue. Transurethral electrovaporization (TUVP). This procedure uses electrodes to destroy and remove a small amount of prostate tissue. Prostatic urethral lift. This procedure inserts an implant to push the lobes of the prostate away from the urethra. Follow these instructions at home: Take over-the-counter and prescription medicines only as told by your health care provider. Monitor your symptoms for any changes. Contact your health care provider with any changes. Avoid drinking large amounts of liquid before going to bed or out in public. Avoid or reduce how much caffeine or alcohol you drink. Give yourself time when you urinate. Keep all follow-up visits as told by your health care provider. This is important. Contact a health care provider if: You have unexplained back pain. Your symptoms do not get better with treatment. You develop side effects from the medicine you are taking. Your urine becomes very dark or has a bad smell. Your lower abdomen becomes distended and you have trouble passing your urine. Get help right away if: You have a fever or chills. You suddenly cannot urinate. You feel lightheaded, or very dizzy, or you faint. There are large amounts of blood or clots in the urine. Your urinary problems become hard to manage. You develop moderate to severe low back or flank pain. The flank is the side of your body between the ribs and the hip. These symptoms may represent a serious problem that is an emergency. Do not wait to see if the symptoms will go away. Get medical help right away. Call your local emergency services (911 in the [place] Do not drive yourself to the hospital. Summary Benign prostatic hyperplasia (BPH) is an enlarged prostate that is caused by the normal aging process and not by cancer. An enlarged prostate can press on the urethra. This can make it hard to pass urine. This  condition is part of a normal aging process and is more likely to develop in men over the age of 50 years. Get help right away if you suddenly cannot urinate. This information is not intended to replace advice given to you by your health care provider. Make sure you discuss any questions you have with your health care provider. Document Revised: 05/23/2020 Document Reviewed: 10/20/2019 Elsevier Patient Education  2022 Elsevier Inc.  

## 2020-12-12 NOTE — Telephone Encounter (Signed)
Spoke with pt and wife who state that pt was feeling fine on the day he was seen by Urology and he feels fine today. Wife reports yesterday's BP was 134/73 and Hr 60 according to home machine. On today BP was 98/72 HR 70. No other complaints at this time. Please advise.

## 2020-12-13 ENCOUNTER — Other Ambulatory Visit: Payer: Self-pay

## 2020-12-13 ENCOUNTER — Encounter: Payer: Self-pay | Admitting: Dermatology

## 2020-12-13 ENCOUNTER — Ambulatory Visit (INDEPENDENT_AMBULATORY_CARE_PROVIDER_SITE_OTHER): Payer: Medicare HMO | Admitting: Dermatology

## 2020-12-13 DIAGNOSIS — C44310 Basal cell carcinoma of skin of unspecified parts of face: Secondary | ICD-10-CM

## 2020-12-13 DIAGNOSIS — C4442 Squamous cell carcinoma of skin of scalp and neck: Secondary | ICD-10-CM | POA: Diagnosis not present

## 2020-12-13 DIAGNOSIS — C44319 Basal cell carcinoma of skin of other parts of face: Secondary | ICD-10-CM

## 2020-12-13 DIAGNOSIS — C4492 Squamous cell carcinoma of skin, unspecified: Secondary | ICD-10-CM

## 2020-12-13 NOTE — Patient Instructions (Signed)

## 2020-12-18 NOTE — Telephone Encounter (Signed)
Pt's wife notified that Per Dr. Lovena Le " No new treatment/eval. Unless he develops Sx's". Wife voiced understanding.

## 2020-12-30 ENCOUNTER — Encounter: Payer: Self-pay | Admitting: Dermatology

## 2020-12-30 NOTE — Progress Notes (Signed)
   Follow-Up Visit   Subjective  Juan Quinn is a 87 y.o. adult who presents for the following: Procedure (Here for tx- left preauricular area, left neck, left knee & left forearm).  Multiple biopsy-proven nonmelanoma skin cancers, will begin with left cheek and neck Location:  Duration:  Quality:  Associated Signs/Symptoms: Modifying Factors:  Severity:  Timing: Context:   Objective  Well appearing patient in no apparent distress; mood and affect are within normal limits. Left Preauricular Area Biopsy site found by nurse in the  Left Neck Biopsy site identified by nurse and may    Head and neck were examined.   Assessment & Plan    Basal cell carcinoma (BCC) of skin of face, unspecified part of face Left Preauricular Area  Destruction of lesion Complexity: simple   Destruction method: electrodesiccation and curettage   Informed consent: discussed and consent obtained   Timeout:  patient name, date of birth, surgical site, and procedure verified Anesthesia: the lesion was anesthetized in a standard fashion   Anesthetic:  1% lidocaine w/ epinephrine 1-100,000 local infiltration Curettage performed in three different directions: Yes   Curettage cycles:  3 Lesion length (cm):  1.1 Lesion width (cm):  1.1 Margin per side (cm):  0 Final wound size (cm):  1.1 Hemostasis achieved with:  ferric subsulfate Outcome: patient tolerated procedure well with no complications   Additional details:  Wound innoculated with 5 fluorouracil solution.  SCC (squamous cell carcinoma) Left Neck  Destruction of lesion Complexity: simple   Destruction method: electrodesiccation and curettage   Informed consent: discussed and consent obtained   Timeout:  patient name, date of birth, surgical site, and procedure verified Anesthesia: the lesion was anesthetized in a standard fashion   Anesthetic:  1% lidocaine w/ epinephrine 1-100,000 local infiltration Curettage performed in three  different directions: Yes   Curettage cycles:  3 Lesion length (cm):  1 Lesion width (cm):  1 Margin per side (cm):  0 Final wound size (cm):  1 Hemostasis achieved with:  ferric subsulfate Outcome: patient tolerated procedure well with no complications   Additional details:  Wound innoculated with 5 fluorouracil solution.      I, Lavonna Monarch, MD, have reviewed all documentation for this visit.  The documentation on 12/30/20 for the exam, diagnosis, procedures, and orders are all accurate and complete.

## 2021-01-21 ENCOUNTER — Ambulatory Visit (INDEPENDENT_AMBULATORY_CARE_PROVIDER_SITE_OTHER): Payer: Medicare HMO | Admitting: Gastroenterology

## 2021-01-21 ENCOUNTER — Encounter (INDEPENDENT_AMBULATORY_CARE_PROVIDER_SITE_OTHER): Payer: Self-pay | Admitting: Gastroenterology

## 2021-01-21 ENCOUNTER — Other Ambulatory Visit: Payer: Self-pay

## 2021-01-21 VITALS — BP 121/67 | HR 86 | Temp 97.8°F | Ht 69.0 in | Wt 213.8 lb

## 2021-01-21 DIAGNOSIS — K529 Noninfective gastroenteritis and colitis, unspecified: Secondary | ICD-10-CM | POA: Diagnosis not present

## 2021-01-21 NOTE — Progress Notes (Signed)
Referring Provider: Celene Squibb, MD Primary Care Physician:  Celene Squibb, MD Primary GI Physician: Rehman  Chief Complaint  Patient presents with   Diarrhea    1 year Follow up on chronic diarrhea.    HPI:   Juan Quinn is a 87 y.o. adult with past medical history of A flutter, Chronic diarrhea, gout, H pylori, HTN, BCC, SCC, adenomatous colon polyp,   Patient presenting today for follow up of chronic diarrhea.   Patient has history of chronic diarrhea for the past few years, previously underwent extensive work up with suspicion of possible SIBO as cause, however, he responded to flagyl first line but non subsequently, he was later started on a CCB for his SVT and reported that diarrhea improved, thereafter. Last seen in October 2021 and advised to take metamucil 2-4 g by mouth nightly to help with continued soft stools.   He reports that he is doing well, having one BM per day. He did not start on metamucil after last visit, but feels that his bowels are basically back to baseline for him. He denies any recent episodes of diarrhea for some time. He states that stools are soft but formed. He reports that his appetite has been good. He has been trying to watch what he eats to manage his weight better. Denies blood in stools or black stools, no issues with acid reflux, dysphagia, early satiety, nausea, vomiting or abdominal pain.   Last Colonoscopy:10/24/16- Diverticulosis in the entire examined colon. segmental biopsies taken. - One 4 mm polyp in the descending colon-   tubular adenoma            - One 5 mm polyp in the ascending colon, tubulovillous adenoma - One 12 mm polyp in the ascending colon,benign colonic mucosa - Internal hemorrhoids. - The examination was otherwise normal on direct and retroflexion views. Last Endoscopy:01/06/18- Normal esophagus. - Z-line irregular, 40 cm from the incisors. - Normal stomach. - Normal duodenal bulb and second portion of the duodenum.  (Duodenal mucosa with no specific changes)  Recommendations:  No repeat colonoscopy r/t advanced age  Past Medical History:  Diagnosis Date   Adenomatous colon polyp 2000   Surveillance due 2017   Atrial fibrillation [facility])    Atrial flutter ([place])    a. 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   Left Shoulder (curet)   Nodular basal cell carcinoma 11/11/2017   Right Post Auricular (treatment after biopsy)   Nodular basal cell carcinoma 09/28/2018   Right Sideburn (treatment after biopsy)   Nodular basal cell carcinoma 09/28/2018   Right Front Neck (treatment after biopsy)   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) ([place]) 08/15/2020   in situ- left neck (Cx35FU)   SCC (squamous cell carcinoma) 10/26/2014   Right Ear(in situ) (curet, cautery, and 5FU)   SCC (squamous cell carcinoma) 10/26/2014   Right Upper Lip(in situ) (  LN2 60 second freeze)   SCC (squamous cell carcinoma) 07/01/2017   Left Sideburn(in situ) (treatment after biopsy)   SCC (squamous cell carcinoma) 07/01/2017   Right Inner Cheek(in situ) (treatment after biopsy)   SCC (squamous cell carcinoma) 11/11/2017   Right Inner Cheek Inf.(in situ) (treatment after biopsy)   SCC (squamous cell carcinoma) 09/28/2018   Right Helix Mid(in situ) (treatment after biopsy)   SCC (squamous cell carcinoma) 09/28/2018   Right Inner Cheek,Inf(in situ) (treatment after biopsy)   SCC (squamous cell carcinoma)  04/27/2019   Tip of right nose(in situ) - CX3+5FU   Squamous cell carcinoma of skin 10/26/2014   Below Left Ear(in situ) (curet, cautery, and 5FU)   Superficial basal cell carcinoma 10/26/2014   Top Right Foot (treatment after biopsy)   Superficial nodular basal cell carcinoma 10/26/2014   Right Forearm (treatment after biopsy)    Past Surgical History:  Procedure Laterality Date   BIOPSY  10/24/2016   Procedure: BIOPSY;  Surgeon: Daneil Dolin, MD;  [facility];  Service: Endoscopy;;  colon   BIOPSY  01/06/2018   Procedure: BIOPSY;  Surgeon: Rogene Houston, MD;  [facility]: [facility];  Service: Endoscopy;;  duodenal   COLONOSCOPY  June 2012   Dr. Gala Romney: adenomatous polyps, due for surveillance if health permits in 1194   COLONOSCOPY N/A 10/24/2016   Procedure: COLONOSCOPY;  Surgeon: Daneil Dolin, MD;  Location: [facility];  Service: Endoscopy;  Laterality: N/A;  2:00pm   ESOPHAGOGASTRODUODENOSCOPY N/A 05/24/2013   Dr. Rourk:Prepyloric gastric ulcerations-likely source of hematemesis-likely NSAID-related. H.PYLORI SEROLOGY POSITIVE, TREATED WITH PREVPAC   ESOPHAGOGASTRODUODENOSCOPY N/A 05/23/2013   RDE:YCXKGYJEHU EGD secondary to much clot and food debris in the stomach   ESOPHAGOGASTRODUODENOSCOPY N/A 08/16/2013   Dr. Gala Romney: normal esophagus, stomach empty, deformity of antrum. scar present. Previously noted ulcers completed healed. Patent pylorus, normal D1 and D2   ESOPHAGOGASTRODUODENOSCOPY N/A 01/06/2018   Procedure: ESOPHAGOGASTRODUODENOSCOPY (EGD);  Surgeon: Rogene Houston, MD;  [facility];  Service: Endoscopy;  Laterality: N/A;   HEMORRHOID SURGERY     PACEMAKER IMPLANT N/A 11/15/2020   Procedure: PACEMAKER IMPLANT;  Surgeon: Evans Lance, MD;  Location: [facility];  Service: Cardiovascular;  Laterality: N/A;   POLYPECTOMY  10/24/2016   Procedure: POLYPECTOMY;  Surgeon: Daneil Dolin, MD;  [facility];  Service:  Endoscopy;;  colon   shoulder      Current Outpatient Medications  Medication Sig Dispense Refill   acetaminophen (TYLENOL) 500 MG tablet Take 1,000 mg by mouth at bedtime. May take additional  1000 mg of needed     alfuzosin (UROXATRAL) 10 MG 24 hr tablet Take 1 tablet (10 mg total) by mouth at bedtime. 90 tablet 3   allopurinol (ZYLOPRIM) 300 MG tablet Take 150 mg by mouth daily.      Cholecalciferol (VITAMIN D3) 2000 units TABS Take 6,000 Units by mouth daily.     ELIQUIS 5 MG TABS tablet TAKE 1 TABLET BY MOUTH TWICE A DAY 180 tablet 1   ipratropium (ATROVENT) 0.06 % nasal spray Place 2 sprays into both nostrils daily as needed for rhinitis.      LACTOBACILLUS PO Take 1 tablet by mouth daily.     Polyethyl Glycol-Propyl Glycol (SYSTANE OP) Apply 1 drop to eye 4 (four) times daily as needed (dry eyes).     rOPINIRole (REQUIP) 0.5 MG tablet Take 1 mg by mouth at bedtime.  simvastatin (ZOCOR) 10 MG tablet Take 1 tablet (10 mg total) by mouth daily. 90 tablet 3   verapamil (CALAN-SR) 120 MG CR tablet TAKE 1 TABLET BY MOUTH TWICE A DAY 180 tablet 1   vitamin B-12 (CYANOCOBALAMIN) 1000 MCG tablet Take 3,000 mcg by mouth daily.      Current Facility-Administered Medications  Medication Dose Route Frequency Provider Last Rate Last Admin   triamcinolone acetonide (KENALOG-40) injection 40 mg  40 mg Intramuscular Once Lavonna Monarch, MD        Allergies as of 01/21/2021   (No Known Allergies)    Family History  Problem Relation Age of Onset   Stroke Mother    Stroke Father    Pancreatic cancer Sister    Leukemia Brother    Lung cancer Sister    Colon cancer Neg Hx     Social History   Socioeconomic History   Marital status: Married    Spouse name: Not on file   Number of children: 4   Years of education: Not on file   Highest education level: Not on file  Occupational History   Occupation: retired    Comment: Press photographer  Tobacco Use   Smoking status: Former    Packs/day:  2.00    Years: 50.00    Pack years: 100.00    Types: Cigarettes    Quit date: 07/28/2000    Years since quitting: 20.4   Smokeless tobacco: Former    Quit date: 02/25/1999  Vaping Use   Vaping Use: Never used  Substance and Sexual Activity   Alcohol use: No    Comment: couple glasses wine/week, occ beer or rmixed drink   Drug use: No    Frequency: 2.0 times per week   Sexual activity: Not on file  Other Topics Concern   Not on file  Social History Narrative   Lives w/ wife         Social Determinants of Health   Financial Resource Strain: Not on file  Food Insecurity: Not on file  Transportation Needs: Not on file  Physical Activity: Not on file  Stress: Not on file  Social Connections: Not on file   Review of systems General: negative for malaise, night sweats, fever, chills, weight los Neck: Negative for lumps, goiter, pain and significant neck swelling Resp: Negative for cough, wheezing, dyspnea at rest CV: Negative for chest pain, leg swelling, palpitations, orthopnea GI: denies melena, hematochezia, nausea, vomiting, diarrhea, constipation, dysphagia, odyonophagia, early satiety or unintentional weight loss.  MSK: Negative for joint pain or swelling, back pain, and muscle pain. Derm: Negative for itching or rash Psych: Denies depression, anxiety, memory loss, confusion. No homicidal or suicidal ideation.  Heme: Negative for prolonged bleeding, bruising easily, and swollen nodes. Endocrine: Negative for cold or heat intolerance, polyuria, polydipsia and goiter. Neuro: negative for tremor, gait imbalance, syncope and seizures. The remainder of the review of systems is noncontributory.  Physical Exam: BP 121/67 (BP Location: Left Arm, Patient Position: Sitting, Cuff Size: Large)   Pulse 86   Temp 97.8 F (36.6 C) (Oral)   Ht 5\' 9"  (1.753 m)   Wt 213 lb 12.8 oz (97 kg)   BMI 31.57 kg/m  General:   Alert and oriented. No distress noted. Pleasant and cooperative.   Head:  Normocephalic and atraumatic. Eyes:  Conjuctiva clear without scleral icterus. Mouth:  Oral mucosa pink and moist. Good dentition. No lesions. Heart: Normal rate and rhythm, s1 and s2 heart sounds present.  Lungs: Clear lung sounds in all lobes. Respirations equal and unlabored. Abdomen:  +BS, soft, non-tender and non-distended. No rebound or guarding. No HSM or masses noted. Derm: No palmar erythema or jaundice Msk:  Symmetrical without gross deformities. Normal posture. Extremities:  Without edema. Neurologic:  Alert and  oriented x4 Psych:  Alert and cooperative. Normal mood and affect.  Invalid input(s): 6 MONTHS   ASSESSMENT: Juan Quinn is a 87 y.o. adult presenting today for follow up of chronic diarrhea.  He is doing very well, having 1 BM per day that is soft but formed, no episodes of diarrhea for some time. He is continued on verapamil for his SVT, feels that diarrhea improved a good amount after he was started on this. Appetite is good, has been trying to watch his diet to get his weight better managed.  No red flag symptoms. Patient denies melena, hematochezia, nausea, vomiting, diarrhea, constipation, dysphagia, odyonophagia, early satiety or weight loss.   Given patient is doing so well and having no further issues with diarrhea, we will plan to see him on as needed basis, he will let us know if he develops new GI symptoms.  PLAN:  Continue with current diet 2. Patient will make me aware of he has changes to bowel habits   Follow Up: PRN  Deisy Ozbun L. Alver Sorrow, MSN, APRN, AGNP-C Adult-Gerontology Nurse Practitioner [facility]

## 2021-01-21 NOTE — Patient Instructions (Signed)
I am glad you are doing so well!  We will plan to see you as needed, if something changes or you develop new or worsening GI symptoms, please let us know.

## 2021-02-09 ENCOUNTER — Other Ambulatory Visit: Payer: Self-pay | Admitting: Internal Medicine

## 2021-02-11 NOTE — Telephone Encounter (Signed)
Prescription refill request for Eliquis received. Indication: Atrial Fib Last office visit: 10/16/20  Beckie Salts MD Scr: 0.99 on 11/08/20 Age: 87 Weight: 93.4kg  Based on above findings Eliquis 5mg  twice daily is the appropriate dose.  Refill approved.

## 2021-02-14 ENCOUNTER — Ambulatory Visit (INDEPENDENT_AMBULATORY_CARE_PROVIDER_SITE_OTHER): Payer: Medicare HMO

## 2021-02-14 ENCOUNTER — Other Ambulatory Visit: Payer: Self-pay

## 2021-02-14 ENCOUNTER — Ambulatory Visit (INDEPENDENT_AMBULATORY_CARE_PROVIDER_SITE_OTHER): Payer: Medicare HMO | Admitting: Dermatology

## 2021-02-14 DIAGNOSIS — I495 Sick sinus syndrome: Secondary | ICD-10-CM

## 2021-02-14 DIAGNOSIS — C44619 Basal cell carcinoma of skin of left upper limb, including shoulder: Secondary | ICD-10-CM

## 2021-02-14 DIAGNOSIS — C44719 Basal cell carcinoma of skin of left lower limb, including hip: Secondary | ICD-10-CM

## 2021-02-14 DIAGNOSIS — L570 Actinic keratosis: Secondary | ICD-10-CM

## 2021-02-14 LAB — CUP PACEART REMOTE DEVICE CHECK
Battery Remaining Longevity: 103 mo
Battery Remaining Percentage: 95.5 %
Battery Voltage: 3.02 V
Brady Statistic AP VP Percent: 1 %
Brady Statistic AP VS Percent: 1.2 %
Brady Statistic AS VP Percent: 1 %
Brady Statistic AS VS Percent: 99 %
Brady Statistic RA Percent Paced: 1 %
Brady Statistic RV Percent Paced: 2.1 %
Date Time Interrogation Session: 20221222025551
Implantable Lead Implant Date: 20220922
Implantable Lead Implant Date: 20220922
Implantable Lead Location: 753859
Implantable Lead Location: 753860
Implantable Pulse Generator Implant Date: 20220922
Lead Channel Impedance Value: 460 Ohm
Lead Channel Impedance Value: 510 Ohm
Lead Channel Pacing Threshold Amplitude: 0.5 V
Lead Channel Pacing Threshold Amplitude: 1.25 V
Lead Channel Pacing Threshold Pulse Width: 0.5 ms
Lead Channel Pacing Threshold Pulse Width: 0.5 ms
Lead Channel Sensing Intrinsic Amplitude: 10.5 mV
Lead Channel Sensing Intrinsic Amplitude: 4.3 mV
Lead Channel Setting Pacing Amplitude: 3.5 V
Lead Channel Setting Pacing Amplitude: 3.5 V
Lead Channel Setting Pacing Pulse Width: 0.5 ms
Lead Channel Setting Sensing Sensitivity: 2 mV
Pulse Gen Model: 2272
Pulse Gen Serial Number: 3964503

## 2021-02-14 NOTE — Patient Instructions (Signed)

## 2021-02-22 NOTE — Progress Notes (Signed)
Remote pacemaker transmission.   

## 2021-02-26 ENCOUNTER — Encounter: Payer: Medicare HMO | Admitting: Internal Medicine

## 2021-02-28 ENCOUNTER — Encounter: Payer: Self-pay | Admitting: Internal Medicine

## 2021-02-28 ENCOUNTER — Other Ambulatory Visit: Payer: Self-pay

## 2021-02-28 ENCOUNTER — Ambulatory Visit (INDEPENDENT_AMBULATORY_CARE_PROVIDER_SITE_OTHER): Payer: Medicare HMO | Admitting: Internal Medicine

## 2021-02-28 VITALS — BP 126/70 | HR 79 | Ht 69.0 in | Wt 214.2 lb

## 2021-02-28 DIAGNOSIS — I10 Essential (primary) hypertension: Secondary | ICD-10-CM

## 2021-02-28 DIAGNOSIS — Z950 Presence of cardiac pacemaker: Secondary | ICD-10-CM

## 2021-02-28 DIAGNOSIS — I495 Sick sinus syndrome: Secondary | ICD-10-CM | POA: Diagnosis not present

## 2021-02-28 MED ORDER — VERAPAMIL HCL ER 180 MG PO TBCR: 180.0000 mg | EXTENDED_RELEASE_TABLET | Freq: Two times a day (BID) | ORAL | 3 refills | Status: DC

## 2021-02-28 NOTE — Patient Instructions (Signed)
Medication Instructions:   Increase Verapamil to 180 mg Two Times Daily   *If you need a refill on your cardiac medications before your next appointment, please call your pharmacy*   Lab Work: NONE   If you have labs (blood work) drawn today and your tests are completely normal, you will receive your results only by: [place] (if you have MyChart) OR A paper copy in the mail If you have any lab test that is abnormal or we need to change your treatment, we will call you to review the results.   Testing/Procedures: NONE    Follow-Up: At [facility], you and your health needs are our priority.  As part of our continuing mission to provide you with exceptional heart care, we have created designated Provider Care Teams.  These Care Teams include your primary Cardiologist (physician) and Advanced Practice Providers (APPs -  Physician Assistants and Nurse Practitioners) who all work together to provide you with the care you need, when you need it.  We recommend signing up for the patient portal called "MyChart".  Sign up information is provided on this After Visit Summary.  MyChart is used to connect with patients for Virtual Visits (Telemedicine).  Patients are able to view lab/test results, encounter notes, upcoming appointments, etc.  Non-urgent messages can be sent to your provider as well.   To learn more about what you can do with MyChart, go to [email or web address].    Your next appointment:   1 year(s)  The format for your next appointment:   In Person  Provider:   Cristopher Peru, MD    Other Instructions Thank you for choosing [facility]!

## 2021-02-28 NOTE — Progress Notes (Signed)
HPI Juan Quinn returns today for followup. He is a pleasant 88 yo man with a h/o PAF and symptomatic tachy-brady syndrome who underwent PPM insertion over 3 months ago. The patient denies chest pain and has rare palpitations. He has some dyspnea with exertion. His diarrhea improved with verapamil.  No Known Allergies   Current Outpatient Medications  Medication Sig Dispense Refill   acetaminophen (TYLENOL) 500 MG tablet Take 1,000 mg by mouth at bedtime. May take additional  1000 mg of needed     alfuzosin (UROXATRAL) 10 MG 24 hr tablet Take 1 tablet (10 mg total) by mouth at bedtime. 90 tablet 3   allopurinol (ZYLOPRIM) 300 MG tablet Take 150 mg by mouth daily.      Cholecalciferol (VITAMIN D3) 2000 units TABS Take 6,000 Units by mouth daily.     ELIQUIS 5 MG TABS tablet TAKE 1 TABLET BY MOUTH TWICE A DAY 180 tablet 1   ipratropium (ATROVENT) 0.06 % nasal spray Place 2 sprays into both nostrils daily as needed for rhinitis.      LACTOBACILLUS PO Take 1 tablet by mouth daily.     Polyethyl Glycol-Propyl Glycol (SYSTANE OP) Apply 1 drop to eye 4 (four) times daily as needed (dry eyes).     rOPINIRole (REQUIP) 0.5 MG tablet Take 1 mg by mouth at bedtime.     simvastatin (ZOCOR) 10 MG tablet Take 1 tablet (10 mg total) by mouth daily. 90 tablet 3   verapamil (CALAN-SR) 180 MG CR tablet Take 1 tablet (180 mg total) by mouth 2 (two) times daily. 180 tablet 3   vitamin B-12 (CYANOCOBALAMIN) 1000 MCG tablet Take 3,000 mcg by mouth daily.      Current Facility-Administered Medications  Medication Dose Route Frequency Provider Last Rate Last Admin   triamcinolone acetonide (KENALOG-40) injection 40 mg  40 mg Intramuscular Once Lavonna Monarch, MD         Past Medical History:  Diagnosis Date   AAA (abdominal aortic aneurysm)    Adenomatous colon polyp 2000   Surveillance due 2017   Atrial fibrillation ([place])    Atrial flutter ([place])    a. diagnosed in 12/2017   Basal cell carcinoma  08/15/2020   left knee- anterior (CX35FU)   Basal cell carcinoma 08/15/2020   left forearm-posterior (CX35FU)   BCC (basal cell carcinoma of skin) 04/27/2019   Top Right Shoulder (curet, cautery and 5FU)   BCC (basal cell carcinoma of skin) 08/15/2020   left preauricular area (Cx35FU)   Chronic diarrhea    Gout    Heart murmur    Helicobacter pylori antibody positive 2015   treatment with Prevpac   History of stomach ulcers    HTN (hypertension)    Hypercholesteremia    Nodular basal cell carcinoma 07/17/2016   Left Shoulder (curet)   Nodular basal cell carcinoma 11/11/2017   Right Post Auricular (treatment after biopsy)   Nodular basal cell carcinoma 09/28/2018   Right Sideburn (treatment after biopsy)   Nodular basal cell carcinoma 09/28/2018   Right Front Neck (treatment after biopsy)   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   in situ- left neck (Cx35FU)   SCC (squamous cell carcinoma) 10/26/2014   Right Ear(in situ) (curet, cautery, and 5FU)   SCC (squamous cell carcinoma) 10/26/2014   Right Upper Lip(in situ) (LN2 60 second freeze)   SCC (squamous cell carcinoma) 07/01/2017   Left Sideburn(in situ) (treatment after biopsy)   SCC (  squamous cell carcinoma) 07/01/2017   Right Inner Cheek(in situ) (treatment after biopsy)   SCC (squamous cell carcinoma) 11/11/2017   Right Inner Cheek Inf.(in situ) (treatment after biopsy)   SCC (squamous cell carcinoma) 09/28/2018   Right Helix Mid(in situ) (treatment after biopsy)   SCC (squamous cell carcinoma) 09/28/2018   Right Inner Cheek,Inf(in situ) (treatment after biopsy)   SCC (squamous cell carcinoma) 04/27/2019   Tip of right nose(in situ) - CX3+5FU   Squamous cell carcinoma of skin 10/26/2014   Below Left Ear(in situ) (curet, cautery, and 5FU)   Superficial basal cell carcinoma 10/26/2014   Top Right Foot (treatment after biopsy)   Superficial nodular basal cell carcinoma 10/26/2014   Right Forearm  (treatment after biopsy)    ROS:   All systems reviewed and negative except as noted in the HPI.   Past Surgical History:  Procedure Laterality Date   BIOPSY  10/24/2016   Procedure: BIOPSY;  Surgeon: Daneil Dolin, MD;  [facility];  Service: Endoscopy;;  colon   BIOPSY  01/06/2018   Procedure: BIOPSY;  Surgeon: Rogene Houston, MD;  [facility];  Service: Endoscopy;;  duodenal   COLONOSCOPY  07/2010   Dr. Gala Romney: adenomatous polyps, due for surveillance if health permits in 4481   COLONOSCOPY N/A 10/24/2016   Rourk: diverticulosis in entire examined colon, 54mm polyp tubular adenoma, 38mm polyp tubulovillous adenoma, 1mm polyp bening colonic mucosa, internal hemmorhoids   ESOPHAGOGASTRODUODENOSCOPY N/A 05/24/2013   Dr. Rourk:Prepyloric gastric ulcerations-likely source of hematemesis-likely NSAID-related. H.PYLORI SEROLOGY POSITIVE, TREATED WITH PREVPAC   ESOPHAGOGASTRODUODENOSCOPY N/A 05/23/2013   E[facility]:[id number] EGD secondary to much clot and food debris in the stomach   ESOPHAGOGASTRODUODENOSCOPY N/A 08/16/2013   Dr. Gala Romney: normal esophagus, stomach empty, deformity of antrum. scar present. Previously noted ulcers completed healed. Patent pylorus, normal D1 and D2   ESOPHAGOGASTRODUODENOSCOPY N/A 01/06/2018   Procedure: ESOPHAGOGASTRODUODENOSCOPY (EGD);  Surgeon: Rogene Houston, MD;  [facility];  Service: Endoscopy;  Laterality: N/A;   HEMORRHOID SURGERY     PACEMAKER IMPLANT N/A 11/15/2020   Procedure: PACEMAKER IMPLANT;  Surgeon: Evans Lance, MD;  Location: [facility];  Service: Cardiovascular;  Laterality: N/A;   POLYPECTOMY  10/24/2016   Procedure: POLYPECTOMY;  Surgeon: Daneil Dolin, MD;  [facility];  Service: Endoscopy;;  colon   shoulder       Family History  Problem Relation Age of Onset   Stroke Mother    Stroke Father    Pancreatic cancer Sister    Leukemia Brother    Lung cancer Sister     Colon cancer Neg Hx      Social History   Socioeconomic History   Marital status: Married    Spouse name: Not on file   Number of children: 4   Years of education: Not on file   Highest education level: Not on file  Occupational History   Occupation: retired    Comment: Press photographer  Tobacco Use   Smoking status: Former    Packs/day: 2.00    Years: 50.00    Pack years: 100.00    Types: Cigarettes    Quit date: 07/28/2000    Years since quitting: 20.6   Smokeless tobacco: Former    Quit date: 02/25/1999  Vaping Use   Vaping Use: Never used  Substance and Sexual Activity   Alcohol use: No    Comment: couple glasses wine/week, occ beer or rmixed drink  Drug use: No    Frequency: 2.0 times per week   Sexual activity: Not on file  Other Topics Concern   Not on file  Social History Narrative   Lives w/ wife         Social Determinants of Health   Financial Resource Strain: Not on file  Food Insecurity: Not on file  Transportation Needs: Not on file  Physical Activity: Not on file  Stress: Not on file  Social Connections: Not on file  Intimate Partner Violence: Not on file     BP 126/70    Pulse 79    Ht 5\' 9"  (1.753 m)    Wt 214 lb 3.2 oz (97.2 kg)    SpO2 97%    BMI 31.63 kg/m   Physical Exam:  Well appearing NAD HEENT: Unremarkable Neck:  No JVD, no thyromegally Lymphatics:  No adenopathy Back:  No CVA tenderness Lungs:  Clear with no wheezes HEART:  IRegular rate rhythm, no murmurs, no rubs, no clicks Abd:  soft, positive bowel sounds, no organomegally, no rebound, no guarding Ext:  2 plus pulses, no edema, no cyanosis, no clubbing Skin:  No rashes no nodules Neuro:  CN II through XII intact, motor grossly intact  DEVICE  Normal device function.  See PaceArt for details.   Assess/Plan:  Symptomatic tachy-brady syndrome - he is improved after PPM but rates are still not well controlled. I have recommended he uptitrate his verapamil. We discussed rhythm  control as well. We also discussed digoxin. However for now he will increase the verapamil to 360 mg daily. Coags -he has had no bleeding on eliquis.  Near syncope - he has not had any symptoms since his PPM insertion.  Sinus node dysfunction - he is now mostly in atrial fib and he will undergo watchful waiting.  Carleene Overlie Vinson Tietze,MD

## 2021-02-28 NOTE — Progress Notes (Signed)
HPI  No Known Allergies   Current Outpatient Medications  Medication Sig Dispense Refill   acetaminophen (TYLENOL) 500 MG tablet Take 1,000 mg by mouth at bedtime. May take additional  1000 mg of needed     alfuzosin (UROXATRAL) 10 MG 24 hr tablet Take 1 tablet (10 mg total) by mouth at bedtime. 90 tablet 3   allopurinol (ZYLOPRIM) 300 MG tablet Take 150 mg by mouth daily.      Cholecalciferol (VITAMIN D3) 2000 units TABS Take 6,000 Units by mouth daily.     ELIQUIS 5 MG TABS tablet TAKE 1 TABLET BY MOUTH TWICE A DAY 180 tablet 1   ipratropium (ATROVENT) 0.06 % nasal spray Place 2 sprays into both nostrils daily as needed for rhinitis.      LACTOBACILLUS PO Take 1 tablet by mouth daily.     Polyethyl Glycol-Propyl Glycol (SYSTANE OP) Apply 1 drop to eye 4 (four) times daily as needed (dry eyes).     rOPINIRole (REQUIP) 0.5 MG tablet Take 1 mg by mouth at bedtime.     simvastatin (ZOCOR) 10 MG tablet Take 1 tablet (10 mg total) by mouth daily. 90 tablet 3   verapamil (CALAN-SR) 120 MG CR tablet TAKE 1 TABLET BY MOUTH TWICE A DAY 180 tablet 1   vitamin B-12 (CYANOCOBALAMIN) 1000 MCG tablet Take 3,000 mcg by mouth daily.      Current Facility-Administered Medications  Medication Dose Route Frequency Provider Last Rate Last Admin   triamcinolone acetonide (KENALOG-40) injection 40 mg  40 mg Intramuscular Once Lavonna Monarch, MD         Past Medical History:  Diagnosis Date   AAA (abdominal aortic aneurysm)    Adenomatous colon polyp 2000   Surveillance due 2017   Atrial fibrillation ([place])    Atrial flutter ([place])    a. diagnosed in 12/2017   Basal cell carcinoma 08/15/2020   left knee- anterior (CX35FU)   Basal cell carcinoma 08/15/2020   left forearm-posterior (CX35FU)   BCC (basal cell carcinoma of skin) 04/27/2019   Top Right Shoulder (curet, cautery and 5FU)   BCC (basal cell carcinoma of skin) 08/15/2020   left preauricular area (Cx35FU)   Chronic diarrhea    Gout     Heart murmur    Helicobacter pylori antibody positive 2015   treatment with Prevpac   History of stomach ulcers    HTN (hypertension)    Hypercholesteremia    Nodular basal cell carcinoma 07/17/2016   Left Shoulder (curet)   Nodular basal cell carcinoma 11/11/2017   Right Post Auricular (treatment after biopsy)   Nodular basal cell carcinoma 09/28/2018   Right Sideburn (treatment after biopsy)   Nodular basal cell carcinoma 09/28/2018   Right Front Neck (treatment after biopsy)   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   in situ- left neck (Cx35FU)   SCC (squamous cell carcinoma) 10/26/2014   Right Ear(in situ) (curet, cautery, and 5FU)   SCC (squamous cell carcinoma) 10/26/2014   Right Upper Lip(in situ) (LN2 60 second freeze)   SCC (squamous cell carcinoma) 07/01/2017   Left Sideburn(in situ) (treatment after biopsy)   SCC (squamous cell carcinoma) 07/01/2017   Right Inner Cheek(in situ) (treatment after biopsy)   SCC (squamous cell carcinoma) 11/11/2017   Right Inner Cheek Inf.(in situ) (treatment after biopsy)   SCC (squamous cell carcinoma) 09/28/2018   Right Helix Mid(in situ) (treatment after biopsy)   SCC (squamous cell carcinoma) 09/28/2018  Right Inner Cheek,Inf(in situ) (treatment after biopsy)   SCC (squamous cell carcinoma) 04/27/2019   Tip of right nose(in situ) - CX3+5FU   Squamous cell carcinoma of skin 10/26/2014   Below Left Ear(in situ) (curet, cautery, and 5FU)   Superficial basal cell carcinoma 10/26/2014   Top Right Foot (treatment after biopsy)   Superficial nodular basal cell carcinoma 10/26/2014   Right Forearm (treatment after biopsy)    ROS:   All systems reviewed and negative except as noted in the HPI.   Past Surgical History:  Procedure Laterality Date   BIOPSY  10/24/2016   Procedure: BIOPSY;  Surgeon: Daneil Dolin, MD;  [facility];  Service: Endoscopy;;  colon   BIOPSY  01/06/2018    Procedure: BIOPSY;  Surgeon: Rogene Houston, MD;  [facility];  Service: Endoscopy;;  duodenal   COLONOSCOPY  07/2010   Dr. Gala Romney: adenomatous polyps, due for surveillance if health permits in 0354   COLONOSCOPY N/A 10/24/2016   Rourk: diverticulosis in entire examined colon, 44mm polyp tubular adenoma, 12mm polyp tubulovillous adenoma, 34mm polyp bening colonic mucosa, internal hemmorhoids   ESOPHAGOGASTRODUODENOSCOPY N/A 05/24/2013   Dr. Rourk:Prepyloric gastric ulcerations-likely source of hematemesis-likely NSAID-related. H.PYLORI SEROLOGY POSITIVE, TREATED WITH PREVPAC   ESOPHAGOGASTRODUODENOSCOPY N/A 05/23/2013   SF[facility]:[id number] EGD secondary to much clot and food debris in the stomach   ESOPHAGOGASTRODUODENOSCOPY N/A 08/16/2013   Dr. Gala Romney: normal esophagus, stomach empty, deformity of antrum. scar present. Previously noted ulcers completed healed. Patent pylorus, normal D1 and D2   ESOPHAGOGASTRODUODENOSCOPY N/A 01/06/2018   Procedure: ESOPHAGOGASTRODUODENOSCOPY (EGD);  Surgeon: Rogene Houston, MD;  [facility];  Service: Endoscopy;  Laterality: N/A;   HEMORRHOID SURGERY     PACEMAKER IMPLANT N/A 11/15/2020   Procedure: PACEMAKER IMPLANT;  Surgeon: Evans Lance, MD;  Location: [facility];  Service: Cardiovascular;  Laterality: N/A;   POLYPECTOMY  10/24/2016   Procedure: POLYPECTOMY;  Surgeon: Daneil Dolin, MD;  [facility];  Service: Endoscopy;;  colon   shoulder       Family History  Problem Relation Age of Onset   Stroke Mother    Stroke Father    Pancreatic cancer Sister    Leukemia Brother    Lung cancer Sister    Colon cancer Neg Hx      Social History   Socioeconomic History   Marital status: Married    Spouse name: Not on file   Number of children: 4   Years of education: Not on file   Highest education level: Not on file  Occupational History   Occupation: retired    Comment: Press photographer  Tobacco Use   Smoking  status: Former    Packs/day: 2.00    Years: 50.00    Pack years: 100.00    Types: Cigarettes    Quit date: 07/28/2000    Years since quitting: 20.6   Smokeless tobacco: Former    Quit date: 02/25/1999  Vaping Use   Vaping Use: Never used  Substance and Sexual Activity   Alcohol use: No    Comment: couple glasses wine/week, occ beer or rmixed drink   Drug use: No    Frequency: 2.0 times per week   Sexual activity: Not on file  Other Topics Concern   Not on file  Social History Narrative   Lives w/ wife         Social Determinants of Health   Financial Resource Strain: Not  on file  Food Insecurity: Not on file  Transportation Needs: Not on file  Physical Activity: Not on file  Stress: Not on file  Social Connections: Not on file  Intimate Partner Violence: Not on file     BP 126/70    Pulse 79    Ht 5\' 9"  (1.753 m)    Wt 214 lb 3.2 oz (97.2 kg)    SpO2 97%    BMI 31.63 kg/m   Physical Exam:  Well appearing NAD HEENT: Unremarkable Neck:  No JVD, no thyromegally Lymphatics:  No adenopathy Back:  No CVA tenderness Lungs:  Clear HEART:  Regular rate rhythm, no murmurs, no rubs, no clicks Abd:  soft, positive bowel sounds, no organomegally, no rebound, no guarding Ext:  2 plus pulses, no edema, no cyanosis, no clubbing Skin:  No rashes no nodules Neuro:  CN II through XII intact, motor grossly intact  EKG  DEVICE  Normal device function.  See PaceArt for details.   Assess/Plan:

## 2021-03-16 ENCOUNTER — Encounter: Payer: Self-pay | Admitting: Dermatology

## 2021-03-16 NOTE — Progress Notes (Signed)
° °  Follow-Up Visit   Subjective  Juan Quinn is a 88 y.o. adult who presents for the following: Procedure (Patient here today for treatment of BCC x 2 left knee - anterior and left forearm - posterior).  Biopsy-proven skin cancers arm and leg plus crust on cheek Location:  Duration:  Quality:  Associated Signs/Symptoms: Modifying Factors:  Severity:  Timing: Context:   Objective  Well appearing patient in no apparent distress; mood and affect are within normal limits. Left Knee - Anterior Lesion identified by Dr.Simcha Speir and nurse in room.    Left Forearm - Posterior Lesion identified by Dr.Teruko Joswick and nurse in room.    Left Preauricular Area Gritty 68mm pink crust      A focused examination was performed including head, neck, arms, legs.. Relevant physical exam findings are noted in the Assessment and Plan.   Assessment & Plan    Basal cell carcinoma (BCC) of skin of left lower extremity including hip Left Knee - Anterior  Destruction of lesion Complexity: simple   Destruction method: electrodesiccation and curettage   Informed consent: discussed and consent obtained   Timeout:  patient name, date of birth, surgical site, and procedure verified Anesthesia: the lesion was anesthetized in a standard fashion   Anesthetic:  1% lidocaine w/ epinephrine 1-100,000 local infiltration Curettage performed in three different directions: Yes   Curettage cycles:  3 Lesion length (cm):  1.6 Lesion width (cm):  1.6 Margin per side (cm):  0 Final wound size (cm):  1.6 Hemostasis achieved with:  ferric subsulfate Outcome: patient tolerated procedure well with no complications   Post-procedure details: sterile dressing applied and wound care instructions given   Dressing type: bandage and petrolatum   Additional details:  Wound innoculated with 5 fluorouracil solution.  Basal cell carcinoma (BCC) of skin of left upper extremity including shoulder Left Forearm -  Posterior  Destruction of lesion Complexity: simple   Destruction method: electrodesiccation and curettage   Informed consent: discussed and consent obtained   Timeout:  patient name, date of birth, surgical site, and procedure verified Anesthesia: the lesion was anesthetized in a standard fashion   Anesthetic:  1% lidocaine w/ epinephrine 1-100,000 local infiltration Curettage performed in three different directions: Yes   Curettage cycles:  3 Lesion length (cm):  1.4 Lesion width (cm):  1.4 Margin per side (cm):  0 Final wound size (cm):  1.4 Hemostasis achieved with:  ferric subsulfate Outcome: patient tolerated procedure well with no complications   Post-procedure details: sterile dressing applied and wound care instructions given   Dressing type: bandage and petrolatum   Additional details:  Wound innoculated with 5 fluorouracil solution.  Actinic keratosis Left Preauricular Area  Destruction of lesion - Left Preauricular Area Complexity: simple   Destruction method: cryotherapy   Informed consent: discussed and consent obtained   Timeout:  patient name, date of birth, surgical site, and procedure verified Lesion destroyed using liquid nitrogen: Yes   Cryotherapy cycles:  3 Outcome: patient tolerated procedure well with no complications   Post-procedure details: wound care instructions given        I, Lavonna Monarch, MD, have reviewed all documentation for this visit.  The documentation on 03/16/21 for the exam, diagnosis, procedures, and orders are all accurate and complet

## 2021-04-02 DIAGNOSIS — R7301 Impaired fasting glucose: Secondary | ICD-10-CM | POA: Diagnosis not present

## 2021-04-02 DIAGNOSIS — E782 Mixed hyperlipidemia: Secondary | ICD-10-CM | POA: Diagnosis not present

## 2021-04-02 DIAGNOSIS — I10 Essential (primary) hypertension: Secondary | ICD-10-CM | POA: Diagnosis not present

## 2021-04-03 ENCOUNTER — Other Ambulatory Visit: Payer: Self-pay

## 2021-04-09 DIAGNOSIS — R059 Cough, unspecified: Secondary | ICD-10-CM | POA: Diagnosis not present

## 2021-04-09 DIAGNOSIS — N401 Enlarged prostate with lower urinary tract symptoms: Secondary | ICD-10-CM | POA: Diagnosis not present

## 2021-04-09 DIAGNOSIS — E782 Mixed hyperlipidemia: Secondary | ICD-10-CM | POA: Diagnosis not present

## 2021-04-09 DIAGNOSIS — D696 Thrombocytopenia, unspecified: Secondary | ICD-10-CM | POA: Diagnosis not present

## 2021-04-09 DIAGNOSIS — R197 Diarrhea, unspecified: Secondary | ICD-10-CM | POA: Diagnosis not present

## 2021-04-09 DIAGNOSIS — I482 Chronic atrial fibrillation, unspecified: Secondary | ICD-10-CM | POA: Diagnosis not present

## 2021-04-09 DIAGNOSIS — E538 Deficiency of other specified B group vitamins: Secondary | ICD-10-CM | POA: Diagnosis not present

## 2021-04-09 DIAGNOSIS — I10 Essential (primary) hypertension: Secondary | ICD-10-CM | POA: Diagnosis not present

## 2021-04-09 DIAGNOSIS — R7301 Impaired fasting glucose: Secondary | ICD-10-CM | POA: Diagnosis not present

## 2021-04-09 DIAGNOSIS — G2581 Restless legs syndrome: Secondary | ICD-10-CM | POA: Diagnosis not present

## 2021-04-16 ENCOUNTER — Telehealth (INDEPENDENT_AMBULATORY_CARE_PROVIDER_SITE_OTHER): Payer: Self-pay | Admitting: *Deleted

## 2021-04-16 ENCOUNTER — Telehealth: Payer: Self-pay | Admitting: Internal Medicine

## 2021-04-16 NOTE — Telephone Encounter (Signed)
Pt called for appt. None available. ( Did add to cancellation list) Seen 01/21/21. Pt states he started back having loose watery stools about 2 weeks ago. Has about one per day and sometimes 2 a day, no fever, no blood in stool, no abdominal pain, takes pepto about once every 3 -4  hours. Not helping much.   [phone]

## 2021-04-16 NOTE — Telephone Encounter (Signed)
Pt c/o medication issue:  1. Name of Medication: verapamil (CALAN-SR) 180 MG CR tablet  2. How are you currently taking this medication (dosage and times per day)? Take 1 tablet (180 mg total) by mouth 2 (two) times daily.   3. Are you having a reaction (difficulty breathing--STAT)?   4. What is your medication issue? Patient's wife called saying patient is having watery bowels. She states he has a bowel movement once a day and it's watery.  She is wondering if this medication can cause this.

## 2021-04-16 NOTE — Telephone Encounter (Signed)
I spoke with wife and discussed with her that a calcium channel blocker can cause constipation, not watery stools. I advised her to speak with Dr.Hall's office as they may want to see patient. She agreed to call them.

## 2021-04-17 NOTE — Telephone Encounter (Signed)
Appt made for 11:45 tomorrow

## 2021-04-17 NOTE — Telephone Encounter (Signed)
Another patient had that appt time so mitzie took pt off scheduled bc he was not yet notified of appt. She is going to put him in if anybody else cancels.

## 2021-04-17 NOTE — Telephone Encounter (Signed)
Pt notified of appt tomorrow 2/23 at 11:45

## 2021-04-18 ENCOUNTER — Encounter (INDEPENDENT_AMBULATORY_CARE_PROVIDER_SITE_OTHER): Payer: Self-pay | Admitting: Gastroenterology

## 2021-04-18 ENCOUNTER — Ambulatory Visit (INDEPENDENT_AMBULATORY_CARE_PROVIDER_SITE_OTHER): Payer: Medicare HMO | Admitting: Gastroenterology

## 2021-04-18 ENCOUNTER — Other Ambulatory Visit: Payer: Self-pay

## 2021-04-18 VITALS — BP 112/62 | HR 79 | Temp 97.8°F | Ht 69.0 in | Wt 208.3 lb

## 2021-04-18 DIAGNOSIS — R197 Diarrhea, unspecified: Secondary | ICD-10-CM | POA: Diagnosis not present

## 2021-04-18 DIAGNOSIS — K5900 Constipation, unspecified: Secondary | ICD-10-CM

## 2021-04-18 NOTE — Patient Instructions (Addendum)
Please start metamucil 3.4 g per day, it is possible that the increase in your verpamil caused some constipation which can lead to overflow diarrhea.  Please let me know if you do not have a BM by Monday.  If diarrhea returns, please let me know and we will do stool studies to rule out infection If you develop nausea, vomiting, abdominal pain, rectal bleeding or black stools, please make me aware.

## 2021-04-18 NOTE — Progress Notes (Signed)
Referring Provider: Celene Squibb, MD Primary Care Physician:  Celene Squibb, MD Primary GI Physician: Laural Golden  Chief Complaint  Patient presents with   Diarrhea    Having diarrhea for about 2 weeks. Had issues with diarrhea about 2 -3 years ago. Now having watery stools. Tried pepto. BRAT diet,    HPI:   Juan Quinn is a 88 y.o. adult with past medical history of A flutter, Chronic diarrhea, gout, H pylori, HTN, BCC, SCC, adenomatous colon polyp,   Patient presenting today for diarrhea. Last seen in office 01/21/21 for chronic diarrhea.   Patient has history of chronic diarrhea for the past few years, previously underwent extensive work up with suspicion of possible SIBO as cause, however, he responded to flagyl first line but non subsequently, he was later started on a CCB for his SVT and reported that diarrhea improved, thereafter. having 1 BM per day at last visit.   Today, He reports that he began having diarrhea about 2 weeks ago. 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 States that symptoms improved for a few days with more solid stools then he began having more liquid stools again, states that he began having watery diarrhea again on Tuesday which only lasted through the day and now he has not had any BMs since then.  He did have some black stools back when diarrhea began but was taking pepto bismol at that time, black stools stopped after he stopped pepto bismol. He began pepto bismol again on Tuesday after the diarrhea began. He denies any abdominal pain, no fevers or chills. No nausea or vomiting. Appetite has been good, down about 7 pounds since early January, though He does tell me that he goes to the gym 3x/week for 1 hour at a time. He denies any fecal urgency or fecal incontinence. Notably, he reports that his CCB was increased right around the time  his diarrhea began 2 weeks ago. No red flag symptoms. Patient denies hematochezia, nausea, vomiting, diarrhea, constipation, dysphagia, odyonophagia, early satiety or weight loss.    Last Colonoscopy:10/24/16- Diverticulosis in the entire examined colon. segmental biopsies taken. - One 4 mm polyp in the descending colon-   tubular adenoma            - One 5 mm polyp in the ascending colon, tubulovillous adenoma - One 12 mm polyp in the ascending colon,benign colonic mucosa - Internal hemorrhoids. - The examination was otherwise normal on direct and retroflexion views. Last Endoscopy:01/06/18- Normal esophagus. - Z-line irregular, 40 cm from the incisors. - Normal stomach. - Normal duodenal bulb and second portion of the duodenum. (Duodenal mucosa with no specific changes)  Recommendations:    Past Medical History:  Diagnosis Date   AAA (abdominal aortic aneurysm)    Adenomatous colon polyp 2000   Surveillance due 2017   Atrial fibrillation ([place])    Atrial flutter ([place])    a. diagnosed in 12/2017   Basal cell carcinoma 08/15/2020   left knee- anterior (CX35FU)   Basal cell carcinoma 08/15/2020   left forearm-posterior (CX35FU)   BCC (basal cell carcinoma of skin) 04/27/2019   Top Right Shoulder (curet, cautery and 5FU)   BCC (basal cell carcinoma of skin) 08/15/2020   left preauricular area (Cx35FU)   Chronic diarrhea    Gout    Heart murmur  Helicobacter pylori antibody positive 2015   treatment with Prevpac   History of stomach ulcers    HTN (hypertension)    Hypercholesteremia    Nodular basal cell carcinoma 07/17/2016   Left Shoulder (curet)   Nodular basal cell carcinoma 11/11/2017   Right Post Auricular (treatment after biopsy)   Nodular basal cell carcinoma 09/28/2018   Right Sideburn (treatment after biopsy)   Nodular basal cell carcinoma 09/28/2018   Right Front Neck (treatment after biopsy)   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    in situ- left neck (Cx35FU)   SCC (squamous cell carcinoma) 10/26/2014   Right Ear(in situ) (curet, cautery, and 5FU)   SCC (squamous cell carcinoma) 10/26/2014   Right Upper Lip(in situ) (LN2 60 second freeze)   SCC (squamous cell carcinoma) 07/01/2017   Left Sideburn(in situ) (treatment after biopsy)   SCC (squamous cell carcinoma) 07/01/2017   Right Inner Cheek(in situ) (treatment after biopsy)   SCC (squamous cell carcinoma) 11/11/2017   Right Inner Cheek Inf.(in situ) (treatment after biopsy)   SCC (squamous cell carcinoma) 09/28/2018   Right Helix Mid(in situ) (treatment after biopsy)   SCC (squamous cell carcinoma) 09/28/2018   Right Inner Cheek,Inf(in situ) (treatment after biopsy)   SCC (squamous cell carcinoma) 04/27/2019   Tip of right nose(in situ) - CX3+5FU   Squamous cell carcinoma of skin 10/26/2014   Below Left Ear(in situ) (curet, cautery, and 5FU)   Superficial basal cell carcinoma 10/26/2014   Top Right Foot (treatment after biopsy)   Superficial nodular basal cell carcinoma 10/26/2014   Right Forearm (treatment after biopsy)    Past Surgical History:  Procedure Laterality Date   BIOPSY  10/24/2016   Procedure: BIOPSY;  Surgeon: Daneil Dolin, MD;  [facility];  Service: Endoscopy;;  colon   BIOPSY  01/06/2018   Procedure: BIOPSY;  Surgeon: Rogene Houston, MD;  [facility]: [facility];  Service: Endoscopy;;  duodenal   COLONOSCOPY  07/2010   Dr. Gala Romney: adenomatous polyps, due for surveillance if health permits in [id number]   COLONOSCOPY N/A 10/24/2016   Rourk: diverticulosis in entire examined colon, 71mm polyp tubular adenoma, 36mm polyp tubulovillous adenoma, 59mm polyp bening colonic mucosa, internal hemmorhoids   ESOPHAGOGASTRODUODENOSCOPY N/A 05/24/2013   Dr. Rourk:Prepyloric gastric ulcerations-likely source of hematemesis-likely NSAID-related. H.PYLORI SEROLOGY POSITIVE, TREATED WITH PREVPAC   ESOPHAGOGASTRODUODENOSCOPY N/A 05/23/2013    [facility]:[id number] EGD secondary to much clot and food debris in the stomach   ESOPHAGOGASTRODUODENOSCOPY N/A 08/16/2013   Dr. Gala Romney: normal esophagus, stomach empty, deformity of antrum. scar present. Previously noted ulcers completed healed. Patent pylorus, normal D1 and D2   ESOPHAGOGASTRODUODENOSCOPY N/A 01/06/2018   Procedure: ESOPHAGOGASTRODUODENOSCOPY (EGD);  Surgeon: Rogene Houston, MD;  [facility];  Service: Endoscopy;  Laterality: N/A;   HEMORRHOID SURGERY     PACEMAKER IMPLANT N/A 11/15/2020   Procedure: PACEMAKER IMPLANT;  Surgeon: Evans Lance, MD;  Location: [facility];  Service: Cardiovascular;  Laterality: N/A;   POLYPECTOMY  10/24/2016   Procedure: POLYPECTOMY;  Surgeon: Daneil Dolin, MD;  [facility];  Service: Endoscopy;;  colon   shoulder      Current Outpatient Medications  Medication Sig Dispense Refill   acetaminophen (TYLENOL) 500 MG tablet Take 1,000 mg by mouth at bedtime. May take additional  1000 mg of needed     alfuzosin (UROXATRAL) 10 MG 24 hr tablet Take 1 tablet (10 mg total) by mouth at  bedtime. 90 tablet 3   allopurinol (ZYLOPRIM) 300 MG tablet Take 150 mg by mouth daily.      Cholecalciferol (VITAMIN D3) 2000 units TABS Take 6,000 Units by mouth daily.     ELIQUIS 5 MG TABS tablet TAKE 1 TABLET BY MOUTH TWICE A DAY 180 tablet 1   ipratropium (ATROVENT) 0.06 % nasal spray Place 2 sprays into both nostrils daily as needed for rhinitis.      LACTOBACILLUS PO Take 1 tablet by mouth daily.     Polyethyl Glycol-Propyl Glycol (SYSTANE OP) Apply 1 drop to eye 4 (four) times daily as needed (dry eyes).     rOPINIRole (REQUIP) 0.5 MG tablet Take 1 mg by mouth at bedtime.     simvastatin (ZOCOR) 10 MG tablet Take 1 tablet (10 mg total) by mouth daily. 90 tablet 3   verapamil (CALAN-SR) 180 MG CR tablet Take 1 tablet (180 mg total) by mouth 2 (two) times daily. 180 tablet 3   vitamin B-12 (CYANOCOBALAMIN) 1000 MCG tablet Take  3,000 mcg by mouth daily.      Current Facility-Administered Medications  Medication Dose Route Frequency Provider Last Rate Last Admin   triamcinolone acetonide (KENALOG-40) injection 40 mg  40 mg Intramuscular Once Lavonna Monarch, MD        Allergies as of 04/18/2021   (No Known Allergies)    Family History  Problem Relation Age of Onset   Stroke Mother    Stroke Father    Pancreatic cancer Sister    Leukemia Brother    Lung cancer Sister    Colon cancer Neg Hx     Social History   Socioeconomic History   Marital status: Married    Spouse name: Not on file   Number of children: 4   Years of education: Not on file   Highest education level: Not on file  Occupational History   Occupation: retired    Comment: Press photographer  Tobacco Use   Smoking status: Former    Packs/day: 2.00    Years: 50.00    Pack years: 100.00    Types: Cigarettes    Quit date: 07/28/2000    Years since quitting: 20.7   Smokeless tobacco: Former    Quit date: 02/25/1999  Vaping Use   Vaping Use: Never used  Substance and Sexual Activity   Alcohol use: No    Comment: couple glasses wine/week, occ beer or rmixed drink   Drug use: No   Sexual activity: Not on file  Other Topics Concern   Not on file  Social History Narrative   Lives w/ wife         Social Determinants of Health   Financial Resource Strain: Not on file  Food Insecurity: Not on file  Transportation Needs: Not on file  Physical Activity: Not on file  Stress: Not on file  Social Connections: Not on file   Review of systems General: negative for malaise, night sweats, fever, chills +weight loss Neck: Negative for lumps, goiter, pain and significant neck swelling Resp: Negative for cough, wheezing, dyspnea at rest CV: Negative for chest pain, leg swelling, palpitations, orthopnea GI: denies melena, hematochezia, nausea, vomiting, dysphagia, odyonophagia, early satiety or unintentional weight loss. +diarrhea +constipation MSK:  Negative for joint pain or swelling, back pain, and muscle pain. Derm: Negative for itching or rash Psych: Denies depression, anxiety, memory loss, confusion. No homicidal or suicidal ideation.  Heme: Negative for prolonged bleeding, bruising easily, and swollen nodes. Endocrine: Negative for  cold or heat intolerance, polyuria, polydipsia and goiter. Neuro: negative for tremor, gait imbalance, syncope and seizures. The remainder of the review of systems is noncontributory.  Physical Exam: BP 112/62 (BP Location: Left Arm, Patient Position: Sitting, Cuff Size: Large)    Pulse 79    Temp 97.8 F (36.6 C) (Oral)    Ht 5\' 9"  (1.753 m)    Wt 208 lb 4.8 oz (94.5 kg)    BMI 30.76 kg/m  General:   Alert and oriented. No distress noted. Pleasant and cooperative.  Head:  Normocephalic and atraumatic. Eyes:  Conjuctiva clear without scleral icterus. Mouth:  Oral mucosa pink and moist. Good dentition. No lesions. Heart: Normal rate and rhythm, s1 and s2 heart sounds present.  Lungs: Clear lung sounds in all lobes. Respirations equal and unlabored. Abdomen:  +BS, soft, non-tender and non-distended. No rebound or guarding. No HSM or masses noted. Derm: No palmar erythema or jaundice Msk:  Symmetrical without gross deformities. Normal posture. Extremities:  Without edema. Neurologic:  Alert and  oriented x4 Psych:  Alert and cooperative. Normal mood and affect.  Invalid input(s): 6 MONTHS   ASSESSMENT: ZYLEN WENIG is a 88 y.o. adult presenting today for diarrhea, however, now experiencing constipation.  Given that patient's chronic diarrhea resolved after he was started on verapamil previously, I suspect he may be experiencing some constipation with secondary overflow diarrhea, related to the recent increase in his verapamil, as he tells me this was increased right around the time he began having diarrhea 2 weeks ago. Verapamil is notorious for constipation as an adverse reaction. Reassuringly he is  without any red flag symptoms and last BM was 2 days ago. I have advised him to start metamucil to see if this helps thicken up stools some and produce a more solid BM, though if diarrhea recurs, we will proceed with stool studies to rule out infectious etiology. If he develops severe abdominal pain, rectal bleeding, melena (while not taking pepto bismol), nausea, vomiting, fevers or chills, he will let me know. He should give me a call on Monday with a report on how symptoms are doing.    PLAN:  Start metamucil 3.4g/day 2. If diarrhea returns, we wil do stool studies 3. Patient to call with an update on Monday on how symptoms are.   Follow Up: 6 months  Windsor Goeken L. Alver Sorrow, MSN, APRN, AGNP-C Adult-Gerontology Nurse Practitioner [facility]

## 2021-04-21 ENCOUNTER — Telehealth (INDEPENDENT_AMBULATORY_CARE_PROVIDER_SITE_OTHER): Payer: Self-pay | Admitting: Gastroenterology

## 2021-04-21 DIAGNOSIS — K5900 Constipation, unspecified: Secondary | ICD-10-CM | POA: Insufficient documentation

## 2021-04-22 ENCOUNTER — Ambulatory Visit (INDEPENDENT_AMBULATORY_CARE_PROVIDER_SITE_OTHER): Payer: Medicare HMO | Admitting: Gastroenterology

## 2021-05-10 DIAGNOSIS — H6123 Impacted cerumen, bilateral: Secondary | ICD-10-CM | POA: Diagnosis not present

## 2021-05-16 ENCOUNTER — Ambulatory Visit (INDEPENDENT_AMBULATORY_CARE_PROVIDER_SITE_OTHER): Payer: Medicare HMO

## 2021-05-16 DIAGNOSIS — I495 Sick sinus syndrome: Secondary | ICD-10-CM

## 2021-05-17 LAB — CUP PACEART REMOTE DEVICE CHECK
Battery Remaining Longevity: 122 mo
Battery Remaining Percentage: 95.5 %
Battery Voltage: 3.02 V
Brady Statistic AP VP Percent: 1 %
Brady Statistic AP VS Percent: 1 %
Brady Statistic AS VP Percent: 3.1 %
Brady Statistic AS VS Percent: 96 %
Brady Statistic RA Percent Paced: 1 %
Brady Statistic RV Percent Paced: 6.1 %
Date Time Interrogation Session: 20230323020018
Implantable Lead Implant Date: 20220922
Implantable Lead Implant Date: 20220922
Implantable Lead Location: 753859
Implantable Lead Location: 753860
Implantable Pulse Generator Implant Date: 20220922
Lead Channel Impedance Value: 460 Ohm
Lead Channel Impedance Value: 480 Ohm
Lead Channel Pacing Threshold Amplitude: 0.5 V
Lead Channel Pacing Threshold Amplitude: 1.25 V
Lead Channel Pacing Threshold Pulse Width: 0.5 ms
Lead Channel Pacing Threshold Pulse Width: 0.5 ms
Lead Channel Sensing Intrinsic Amplitude: 11.5 mV
Lead Channel Sensing Intrinsic Amplitude: 3.9 mV
Lead Channel Setting Pacing Amplitude: 2.5 V
Lead Channel Setting Pacing Amplitude: 2.5 V
Lead Channel Setting Pacing Pulse Width: 0.5 ms
Lead Channel Setting Sensing Sensitivity: 2 mV
Pulse Gen Model: 2272
Pulse Gen Serial Number: 3964503

## 2021-05-23 NOTE — Progress Notes (Signed)
Remote pacemaker transmission.   

## 2021-06-17 ENCOUNTER — Telehealth: Payer: Self-pay | Admitting: Internal Medicine

## 2021-06-17 NOTE — Telephone Encounter (Signed)
Pt c/o medication issue: ? ?1. Name of Medication: ELIQUIS 5 MG TABS tablet ? ?2. How are you currently taking this medication (dosage and times per day)? As directed  ? ?3. Are you having a reaction (difficulty breathing--STAT)? No ? ?4. What is your medication issue?  [facility]. The wife wanted to make sure it is OK for him to take the generic version of Eliquis  ?

## 2021-06-17 NOTE — Telephone Encounter (Signed)
Pts wife advised by triage nurse that Apixaban and Eliquis are the same med and he should go ahead and take it and she verbalized understanding.  ?

## 2021-08-13 ENCOUNTER — Ambulatory Visit: Payer: Medicare HMO | Admitting: Dermatology

## 2021-08-13 ENCOUNTER — Encounter: Payer: Self-pay | Admitting: Dermatology

## 2021-08-13 DIAGNOSIS — L570 Actinic keratosis: Secondary | ICD-10-CM | POA: Diagnosis not present

## 2021-08-13 DIAGNOSIS — D485 Neoplasm of uncertain behavior of skin: Secondary | ICD-10-CM

## 2021-08-13 DIAGNOSIS — C44319 Basal cell carcinoma of skin of other parts of face: Secondary | ICD-10-CM

## 2021-08-13 NOTE — Patient Instructions (Signed)

## 2021-08-15 ENCOUNTER — Ambulatory Visit (INDEPENDENT_AMBULATORY_CARE_PROVIDER_SITE_OTHER): Payer: Medicare HMO

## 2021-08-15 ENCOUNTER — Telehealth: Payer: Self-pay | Admitting: *Deleted

## 2021-08-15 ENCOUNTER — Telehealth: Payer: Self-pay | Admitting: Internal Medicine

## 2021-08-15 DIAGNOSIS — I495 Sick sinus syndrome: Secondary | ICD-10-CM | POA: Diagnosis not present

## 2021-08-15 LAB — CUP PACEART REMOTE DEVICE CHECK
Battery Remaining Longevity: 121 mo
Battery Remaining Percentage: 95.5 %
Battery Voltage: 3.02 V
Brady Statistic AP VP Percent: 1 %
Brady Statistic AP VS Percent: 1.3 %
Brady Statistic AS VP Percent: 1 %
Brady Statistic AS VS Percent: 98 %
Brady Statistic RA Percent Paced: 1 %
Brady Statistic RV Percent Paced: 4.4 %
Date Time Interrogation Session: 20230622020016
Implantable Lead Implant Date: 20220922
Implantable Lead Implant Date: 20220922
Implantable Lead Location: 753859
Implantable Lead Location: 753860
Implantable Pulse Generator Implant Date: 20220922
Lead Channel Impedance Value: 480 Ohm
Lead Channel Impedance Value: 530 Ohm
Lead Channel Pacing Threshold Amplitude: 0.5 V
Lead Channel Pacing Threshold Amplitude: 1.25 V
Lead Channel Pacing Threshold Pulse Width: 0.5 ms
Lead Channel Pacing Threshold Pulse Width: 0.5 ms
Lead Channel Sensing Intrinsic Amplitude: 10.4 mV
Lead Channel Sensing Intrinsic Amplitude: 5 mV
Lead Channel Setting Pacing Amplitude: 2.5 V
Lead Channel Setting Pacing Amplitude: 2.5 V
Lead Channel Setting Pacing Pulse Width: 0.5 ms
Lead Channel Setting Sensing Sensitivity: 2 mV
Pulse Gen Model: 2272
Pulse Gen Serial Number: 3964503

## 2021-08-15 NOTE — Telephone Encounter (Signed)
Pt's spouse would like a call back regarding pt's transmissions. Please advise

## 2021-08-15 NOTE — Telephone Encounter (Signed)
Attempted to return call. No answer, LMTCB. 

## 2021-08-15 NOTE — Telephone Encounter (Signed)
-----   Message from Juan Monarch, MD sent at 08/15/2021  7:07 AM EDT ----- Although the base was treated at the time of the biopsy the microscopic pattern indicates a type of nonmelanoma skin cancer that is prone to locally recur.  If this happens, Dr. Darene Lamer will refer him to the Mohs surgeon.

## 2021-08-15 NOTE — Telephone Encounter (Signed)
Pathology to patient- referral sent to skin surgery center for MOHS 

## 2021-08-15 NOTE — Telephone Encounter (Signed)
Pt's wife is returning call °

## 2021-08-15 NOTE — Telephone Encounter (Signed)
Advised that Dr. Lovena Le has not read the report yet. Advised if she can call back around next Wednesday of Thursday the report may be read by then. Also explained that it is normal for the office not to reach out of the report looks fine, we only will contact patient if there are changes to be made or a change on the report.  Wife stated verbal understanding. Advised to call back next week for update on report. Agreeable to plan.

## 2021-08-22 NOTE — Progress Notes (Signed)
Remote pacemaker transmission.   

## 2021-09-02 DIAGNOSIS — C44319 Basal cell carcinoma of skin of other parts of face: Secondary | ICD-10-CM | POA: Diagnosis not present

## 2021-09-08 ENCOUNTER — Encounter: Payer: Self-pay | Admitting: Dermatology

## 2021-09-08 NOTE — Progress Notes (Signed)
   Follow-Up Visit   Subjective  Juan Quinn is a 88 y.o. adult who presents for the following: Follow-up (Patient here today for 6 month follow up from treatment of [place]. Per patient he doesn't have any concerns today. ).  Follow-up BCC, new crust on face Location:  Duration:  Quality:  Associated Signs/Symptoms: Modifying Factors:  Severity:  Timing: Context:   Objective  Well appearing patient in no apparent distress; mood and affect are within normal limits. Head - Anterior (Face) Multiple small gritty pink crusts, 1 larger lesion on forehead will be treated with LN2 freeze  LEFT PREAURICULAR [place] 7 mm papule, BCC       A focused examination was performed including head and neck.. Relevant physical exam findings are noted in the Assessment and Plan.   Assessment & Plan    AK (actinic keratosis) Head - Anterior (Face)  Destruction of lesion - Head - Anterior (Face) Complexity: simple   Destruction method: cryotherapy   Informed consent: discussed and consent obtained   Timeout:  patient name, date of birth, surgical site, and procedure verified Lesion destroyed using liquid nitrogen: Yes   Cryotherapy cycles:  3 Outcome: patient tolerated procedure well with no complications   Post-procedure details: wound care instructions given    Basal cell carcinoma (BCC) of left cheek LEFT PREAURICULAR  Skin / nail biopsy Type of biopsy: tangential   Informed consent: discussed and consent obtained   Timeout: patient name, date of birth, surgical site, and procedure verified   Anesthesia: the lesion was anesthetized in a standard fashion   Anesthetic:  1% lidocaine w/ epinephrine 1-100,000 local infiltration Instrument used: flexible razor blade   Hemostasis achieved with: aluminum chloride and electrodesiccation   Outcome: patient tolerated procedure well   Post-procedure details: wound care instructions given    Destruction of lesion Complexity: simple    Destruction method: electrodesiccation and curettage   Informed consent: discussed and consent obtained   Timeout:  patient name, date of birth, surgical site, and procedure verified Anesthesia: the lesion was anesthetized in a standard fashion   Anesthetic:  1% lidocaine w/ epinephrine 1-100,000 local infiltration Curettage performed in three different directions: Yes   Curettage cycles:  3 Lesion length (cm):  1 Lesion width (cm):  1 Margin per side (cm):  0 Final wound size (cm):  1 Hemostasis achieved with:  aluminum chloride Outcome: patient tolerated procedure well with no complications   Post-procedure details: wound care instructions given    Specimen 1 - Surgical pathology Differential Diagnosis: bcc scc tx with bx [id number]  Check Margins: No  After shave biopsy the base was treated with curettage plus cautery      I, Lavonna Monarch, MD, have reviewed all documentation for this visit.  The documentation on 09/08/21 for the exam, diagnosis, procedures, and orders are all accurate and complete.

## 2021-09-11 DIAGNOSIS — H353131 Nonexudative age-related macular degeneration, bilateral, early dry stage: Secondary | ICD-10-CM | POA: Diagnosis not present

## 2021-09-11 DIAGNOSIS — Z961 Presence of intraocular lens: Secondary | ICD-10-CM | POA: Diagnosis not present

## 2021-09-11 DIAGNOSIS — H43813 Vitreous degeneration, bilateral: Secondary | ICD-10-CM | POA: Diagnosis not present

## 2021-09-11 DIAGNOSIS — H0102A Squamous blepharitis right eye, upper and lower eyelids: Secondary | ICD-10-CM | POA: Diagnosis not present

## 2021-09-11 DIAGNOSIS — H0102B Squamous blepharitis left eye, upper and lower eyelids: Secondary | ICD-10-CM | POA: Diagnosis not present

## 2021-10-02 DIAGNOSIS — R7301 Impaired fasting glucose: Secondary | ICD-10-CM | POA: Diagnosis not present

## 2021-10-02 DIAGNOSIS — E538 Deficiency of other specified B group vitamins: Secondary | ICD-10-CM | POA: Diagnosis not present

## 2021-10-02 DIAGNOSIS — E782 Mixed hyperlipidemia: Secondary | ICD-10-CM | POA: Diagnosis not present

## 2021-10-02 DIAGNOSIS — Z125 Encounter for screening for malignant neoplasm of prostate: Secondary | ICD-10-CM | POA: Diagnosis not present

## 2021-10-02 DIAGNOSIS — I10 Essential (primary) hypertension: Secondary | ICD-10-CM | POA: Diagnosis not present

## 2021-10-07 DIAGNOSIS — R197 Diarrhea, unspecified: Secondary | ICD-10-CM | POA: Diagnosis not present

## 2021-10-07 DIAGNOSIS — E782 Mixed hyperlipidemia: Secondary | ICD-10-CM | POA: Diagnosis not present

## 2021-10-07 DIAGNOSIS — E538 Deficiency of other specified B group vitamins: Secondary | ICD-10-CM | POA: Diagnosis not present

## 2021-10-07 DIAGNOSIS — Z0001 Encounter for general adult medical examination with abnormal findings: Secondary | ICD-10-CM | POA: Diagnosis not present

## 2021-10-07 DIAGNOSIS — I482 Chronic atrial fibrillation, unspecified: Secondary | ICD-10-CM | POA: Diagnosis not present

## 2021-10-07 DIAGNOSIS — D696 Thrombocytopenia, unspecified: Secondary | ICD-10-CM | POA: Diagnosis not present

## 2021-10-07 DIAGNOSIS — R059 Cough, unspecified: Secondary | ICD-10-CM | POA: Diagnosis not present

## 2021-10-07 DIAGNOSIS — N401 Enlarged prostate with lower urinary tract symptoms: Secondary | ICD-10-CM | POA: Diagnosis not present

## 2021-10-07 DIAGNOSIS — I10 Essential (primary) hypertension: Secondary | ICD-10-CM | POA: Diagnosis not present

## 2021-10-07 DIAGNOSIS — R7301 Impaired fasting glucose: Secondary | ICD-10-CM | POA: Diagnosis not present

## 2021-10-07 DIAGNOSIS — G2581 Restless legs syndrome: Secondary | ICD-10-CM | POA: Diagnosis not present

## 2021-10-07 DIAGNOSIS — I700 Atherosclerosis of aorta: Secondary | ICD-10-CM | POA: Diagnosis not present

## 2021-11-14 ENCOUNTER — Telehealth: Payer: Self-pay

## 2021-11-14 ENCOUNTER — Ambulatory Visit (INDEPENDENT_AMBULATORY_CARE_PROVIDER_SITE_OTHER): Payer: Medicare HMO

## 2021-11-14 DIAGNOSIS — I495 Sick sinus syndrome: Secondary | ICD-10-CM

## 2021-11-14 LAB — CUP PACEART REMOTE DEVICE CHECK
Battery Remaining Longevity: 98 mo
Battery Remaining Percentage: 94 %
Battery Voltage: 3.01 V
Brady Statistic AP VP Percent: 1 %
Brady Statistic AP VS Percent: 1 %
Brady Statistic AS VP Percent: 4.9 %
Brady Statistic AS VS Percent: 94 %
Brady Statistic RA Percent Paced: 1 %
Brady Statistic RV Percent Paced: 7.1 %
Date Time Interrogation Session: 20230921020014
Implantable Lead Implant Date: 20220922
Implantable Lead Implant Date: 20220922
Implantable Lead Location: 753859
Implantable Lead Location: 753860
Implantable Pulse Generator Implant Date: 20220922
Lead Channel Impedance Value: 480 Ohm
Lead Channel Impedance Value: 510 Ohm
Lead Channel Pacing Threshold Amplitude: 1 V
Lead Channel Pacing Threshold Amplitude: 1.375 V
Lead Channel Pacing Threshold Pulse Width: 0.4 ms
Lead Channel Pacing Threshold Pulse Width: 0.4 ms
Lead Channel Sensing Intrinsic Amplitude: 10.8 mV
Lead Channel Sensing Intrinsic Amplitude: 5 mV
Lead Channel Setting Pacing Amplitude: 2 V
Lead Channel Setting Pacing Amplitude: 5 V
Lead Channel Setting Pacing Pulse Width: 0.4 ms
Lead Channel Setting Sensing Sensitivity: 2 mV
Pulse Gen Model: 2272
Pulse Gen Serial Number: 3964503

## 2021-11-14 NOTE — Telephone Encounter (Signed)
Patient's husband was resting.  Spoke with wife (okay per DPR):  Received the following alerts from [employer]:   Scheduled remote reviewed. Normal device function.   Known PAF, on [place] according to previous reports, AF burden is 79% of the time.  Sent to triage for ongoing AF and ventricular amplitude that auto-adjusted to 5 volts Next remote 91 days.  Kathy Breach, RN, CCDS, [employer]  AF burden at 79% with ongoing history.  He is taking his Eliquis.  Ventricular rates are fairly well controlled with avg <100bpm; however, has known dx of AFIB with RVR with current max average around 150bpm.  In January of this year, Dr. Lovena Le considered digoxin but instead went with increase on Verapamil.  Patient has had some ongoing weakness, fatigue, sleeping a lot per wife and not "feeling well" as well as periods of elevated ventricular rates ongoing. This is not new in onset and has been going over past several months.    Ventricular AutoCapture is in high output mode now at (5.0V).  We will monitor this over the next week to see if autocorrects.  If still at high output rate mode, will bring in to clinic to re-program out of that mode. I will call patient next Thursday and assist with walking through sending [place] a remote transmission.  Patient is not due to see Dr. Lovena Le until January. Offered sooner appointment at wife's request; however, she has refused at present. Will wait for recheck with remote transmission next week and then consider seeing PA in next couple of weeks to address patient/wife's concerns.   Reviewed if any urgent emergent concerns to go to ER.  Ensured wife has our direct office number if husband begins to feel poorly again so that we can get a sooner transmission and/or bring him in sooner for appt.  Wife verbalizes understanding and agrees with plan.

## 2021-11-15 DIAGNOSIS — H6123 Impacted cerumen, bilateral: Secondary | ICD-10-CM | POA: Diagnosis not present

## 2021-11-21 ENCOUNTER — Telehealth: Payer: Self-pay

## 2021-11-21 NOTE — Telephone Encounter (Signed)
Transmission received 11/21/2021. I told the patient wife that Leafy Ro will review the transmission and give him a call back.

## 2021-11-21 NOTE — Telephone Encounter (Signed)
Please refer to phone/event encounter for 11/14/21.

## 2021-11-21 NOTE — Telephone Encounter (Addendum)
Received updated transmission 9/28 for patient:  Transmission still showing Ventricular AutoCap is still in high output mode. Will need to bring in to program out.  As patient is having ongoing symptoms (weakness, fatigue, lethargy and "not feeling well" per wife) will bring in to see Jonni Sanger or Renee (PA) and to adjust out of high output mode.     [place],   Can you reach out to patient/wife and let them know that due to symptoms and need to adjust a setting, we will get him in with PA in next week?  Thanks.

## 2021-11-25 NOTE — Progress Notes (Signed)
Remote pacemaker transmission.   

## 2021-12-08 NOTE — Progress Notes (Unsigned)
Cardiology Office Note Date:  12/08/2021  Patient ID:  Juan Quinn, Juan Quinn 27-Apr-1930, MRN [id number] PCP:  Celene Squibb, MD  Electrophysiologist: Dr. Lovena Le  ***refresh   Chief Complaint: *** adjust outputs  History of Present Illness: Juan Quinn is a 88 y.o. adult with history of HTN, HLD, AFib, tachy-brady w/PPM.  He saw Dr. Lovena Le Jan 2023, s/p PPM implant, some DOE though doing well, mentioned diarrhea better with verapamil. Rates not weel controlled and verapamil increased .  Discussed rhythm control, though not planned for AAD/rhythm control at this juncture.  Remote in Sept 2023 noted auto adjust RV outout to 5V, discussed perhaps not ideal HR control, wife reported pt fatigued, not feeling great in general, planned to come in   *** a remote with auto adjusted RV output to 5V *** rates *** bleeding, eliquis, dose, labs *** symptoms??? *** RV pacing % *** fatigue from?? New?  88 y/o *** DCCV??   Device information Abbott dual chamber PPM implanted 11/15/20   Past Medical History:  Diagnosis Date   AAA (abdominal aortic aneurysm) ([place])    Adenomatous colon polyp 2000   Surveillance due 2017   Atrial fibrillation (HCC)    Atrial flutter ([place])    a. 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   Left Shoulder (curet)   Nodular basal cell carcinoma 11/11/2017   Right Post Auricular (treatment after biopsy)   Nodular basal cell carcinoma 09/28/2018    Right Sideburn (treatment after biopsy)   Nodular basal cell carcinoma 09/28/2018   Right Front Neck (treatment after biopsy)   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   in situ- left neck (Cx35FU)   SCC (squamous cell carcinoma) 10/26/2014   Right Ear(in situ) (curet, cautery, and 5FU)   SCC (squamous cell carcinoma) 10/26/2014   Right Upper Lip(in situ) (LN2 60 second freeze)   SCC (squamous cell carcinoma) 07/01/2017   Left Sideburn(in situ) (treatment after biopsy)   SCC (squamous cell carcinoma) 07/01/2017   Right Inner Cheek(in situ) (treatment after biopsy)   SCC (squamous cell carcinoma) 11/11/2017   Right Inner Cheek Inf.(in situ) (treatment after biopsy)   SCC (squamous cell carcinoma) 09/28/2018   Right Helix Mid(in situ) (treatment after biopsy)   SCC (squamous cell carcinoma) 09/28/2018   Right Inner Cheek,Inf(in situ) (treatment after biopsy)   SCC (squamous cell carcinoma) 04/27/2019   Tip of right nose(in situ) - CX3+5FU   Squamous cell carcinoma of skin 10/26/2014   Below Left Ear(in situ) (curet, cautery, and 5FU)   Superficial basal cell carcinoma 10/26/2014   Top Right Foot (treatment after biopsy)   Superficial nodular basal cell carcinoma 10/26/2014   Right Forearm (treatment after biopsy)    Past Surgical History:  Procedure Laterality Date   BIOPSY  10/24/2016   Procedure: BIOPSY;  Surgeon: Daneil Dolin, MD;  [facility];  Service: Endoscopy;;  colon   BIOPSY  01/06/2018   Procedure: BIOPSY;  Surgeon: Rogene Houston, MD;  [facility];  Service: Endoscopy;;  duodenal   COLONOSCOPY  07/2010   Dr. Gala Romney: adenomatous polyps, due for surveillance if health permits in 3818   COLONOSCOPY N/A 10/24/2016   Rourk: diverticulosis in entire examined colon, 70m polyp tubular adenoma, 564mpolyp tubulovillous adenoma, 1236molyp bening colonic mucosa, internal hemmorhoids   ESOPHAGOGASTRODUODENOSCOPY N/A  05/24/2013   Dr. Rourk:Prepyloric gastric ulcerations-likely source of hematemesis-likely NSAID-related. H.PYLORI SEROLOGY POSITIVE, TREATED WITH PREVPAC   ESOPHAGOGASTRODUODENOSCOPY N/A 05/23/2013   RMREXH:BZJIRCVELFD secondary to much clot and food debris in the stomach   ESOPHAGOGASTRODUODENOSCOPY N/A 08/16/2013   Dr. RouGala Romneyormal esophagus, stomach empty, deformity of antrum. scar present. Previously noted ulcers completed healed. Patent pylorus, normal D1 and D2   ESOPHAGOGASTRODUODENOSCOPY N/A 01/06/2018   Procedure: ESOPHAGOGASTRODUODENOSCOPY (EGD);  Surgeon: RehRogene HoustonD;  [facility];  Service: Endoscopy;  Laterality: N/A;   HEMORRHOID SURGERY     PACEMAKER IMPLANT N/A 11/15/2020   Procedure: PACEMAKER IMPLANT;  Surgeon: TayEvans LanceD;  Location: [facility];  Service: Cardiovascular;  Laterality: N/A;   POLYPECTOMY  10/24/2016   Procedure: POLYPECTOMY;  Surgeon: RouDaneil DolinD;  [facility];  Service: Endoscopy;;  colon   shoulder      Current Outpatient Medications  Medication Sig Dispense Refill   acetaminophen (TYLENOL) 500 MG tablet Take 1,000 mg by mouth at bedtime. May take additional  1000 mg of needed     alfuzosin (UROXATRAL) 10 MG 24 hr tablet Take 1 tablet (10 mg total) by mouth at bedtime. 90 tablet 3   allopurinol (ZYLOPRIM) 300 MG tablet Take 150 mg by mouth daily.      Cholecalciferol (VITAMIN D3) 2000 units TABS Take 6,000 Units by mouth daily.     ELIQUIS 5 MG TABS tablet TAKE 1 TABLET BY MOUTH TWICE A DAY 180 tablet 1   gabapentin (NEURONTIN) 100 MG capsule Take 1 capsule by mouth at bedtime.     ipratropium (ATROVENT) 0.06 % nasal spray Place 2 sprays into both nostrils daily as needed for rhinitis.      LACTOBACILLUS PO Take 1 tablet by mouth daily.     Polyethyl Glycol-Propyl Glycol (SYSTANE OP) Apply 1 drop to eye 4 (four) times daily as needed (dry eyes).     rOPINIRole (REQUIP) 1 MG tablet Take 1 mg by  mouth at bedtime.     simvastatin (ZOCOR) 10 MG tablet Take 1 tablet (10 mg total) by mouth daily. 90 tablet 3   verapamil (CALAN-SR) 180 MG CR tablet Take 1 tablet (180 mg total) by mouth 2 (two) times daily. 180 tablet 3   vitamin B-12 (CYANOCOBALAMIN) 1000 MCG tablet Take 3,000 mcg by mouth daily.      Current Facility-Administered Medications  Medication Dose Route Frequency Provider Last Rate Last Admin   triamcinolone acetonide (KENALOG-40) injection 40 mg  40 mg Intramuscular Once TafLavonna MonarchD        Allergies:   Patient has no known allergies.   Social History:  The patient  reports that he quit smoking about 21 years ago. His smoking use included cigarettes. He has a 100.00 pack-year smoking history. He quit smokeless tobacco use about 22 years ago. He reports that he does not drink alcohol and does not use drugs.   Family History:  The patient's family  history includes Leukemia in his brother; Lung cancer in his sister; Pancreatic cancer in his sister; Stroke in his father and mother.  ROS:  Please see the history of present illness.    All other systems are reviewed and otherwise negative.   PHYSICAL EXAM:  VS:  There were no vitals taken for this visit. BMI: There is no height or weight on file to calculate BMI. Well nourished, well developed, in no acute distress HEENT: normocephalic, atraumatic Neck: no JVD, carotid bruits or masses Cardiac:  *** RRR; no significant murmurs, no rubs, or gallops Lungs:  *** CTA b/l, no wheezing, rhonchi or rales Abd: soft, nontender MS: no deformity or *** atrophy Ext: *** no edema Skin: warm and dry, no rash Neuro:  No gross deficits appreciated Psych: euthymic mood, full affect  *** PPM site is stable, no tethering or discomfort   EKG:  Done today and reviewed by myself shows  ***  Device interrogation done today and reviewed by myself:  ***  01/06/2018: TTE Study Conclusions  - Left ventricle: The cavity size was  normal. Wall thickness was    increased in a pattern of moderate LVH. Systolic function was    vigorous. The estimated ejection fraction was in the range of 65%    to 70%. Wall motion was normal; there were no regional wall    motion abnormalities. The study is not technically sufficient to    allow evaluation of LV diastolic function.  - Aortic valve: Mildly calcified annulus. Trileaflet; mildly    thickened leaflets. Valve area (VTI): 3.46 cm^2. Valve area    (Vmax): 3.29 cm^2.  - Mitral valve: Mildly calcified annulus. Mildly thickened leaflets  Recent Labs: No results found for requested labs within last 365 days.  No results found for requested labs within last 365 days.   CrCl cannot be calculated (Patient's most recent lab result is older than the maximum 21 days allowed.).   Wt Readings from Last 3 Encounters:  04/18/21 208 lb 4.8 oz (94.5 kg)  02/28/21 214 lb 3.2 oz (97.2 kg)  01/21/21 213 lb 12.8 oz (97 kg)     Other studies reviewed: Additional studies/records reviewed today include: summarized above  ASSESSMENT AND PLAN:  PPM ***  Persistent Afib CHA2DS2Vasc is 3, on Eliquis, *** appropriately dosed *** % burden *** rates  HTN ***  Disposition: F/u with ***  Current medicines are reviewed at length with the patient today.  The patient did not have any concerns regarding medicines.  Venetia Night, PA-C 12/08/2021 1:08 PM     [address] [place] [address] [phone] (office)  [phone] (fax)

## 2021-12-09 ENCOUNTER — Ambulatory Visit: Payer: Medicare HMO | Attending: Physician Assistant | Admitting: Physician Assistant

## 2021-12-09 ENCOUNTER — Encounter: Payer: Self-pay | Admitting: Physician Assistant

## 2021-12-09 VITALS — BP 92/52 | HR 61 | Ht 69.0 in | Wt 215.6 lb

## 2021-12-09 DIAGNOSIS — R011 Cardiac murmur, unspecified: Secondary | ICD-10-CM

## 2021-12-09 DIAGNOSIS — I4819 Other persistent atrial fibrillation: Secondary | ICD-10-CM | POA: Diagnosis not present

## 2021-12-09 DIAGNOSIS — I10 Essential (primary) hypertension: Secondary | ICD-10-CM

## 2021-12-09 DIAGNOSIS — Z950 Presence of cardiac pacemaker: Secondary | ICD-10-CM | POA: Diagnosis not present

## 2021-12-09 LAB — CUP PACEART INCLINIC DEVICE CHECK
Battery Remaining Longevity: 124 mo
Battery Voltage: 3.01 V
Brady Statistic RA Percent Paced: 0.2 %
Brady Statistic RV Percent Paced: 6.8 %
Date Time Interrogation Session: 20231016171433
Implantable Lead Implant Date: 20220922
Implantable Lead Implant Date: 20220922
Implantable Lead Location: 753859
Implantable Lead Location: 753860
Implantable Pulse Generator Implant Date: 20220922
Lead Channel Impedance Value: 512.5 Ohm
Lead Channel Impedance Value: 525 Ohm
Lead Channel Pacing Threshold Amplitude: 1.5 V
Lead Channel Pacing Threshold Amplitude: 1.5 V
Lead Channel Pacing Threshold Pulse Width: 0.4 ms
Lead Channel Pacing Threshold Pulse Width: 0.4 ms
Lead Channel Sensing Intrinsic Amplitude: 11.1 mV
Lead Channel Sensing Intrinsic Amplitude: 2.8 mV
Lead Channel Setting Pacing Amplitude: 2 V
Lead Channel Setting Pacing Amplitude: 3 V
Lead Channel Setting Pacing Pulse Width: 0.4 ms
Lead Channel Setting Sensing Sensitivity: 2 mV
Pulse Gen Model: 2272
Pulse Gen Serial Number: 3964503

## 2021-12-09 NOTE — Patient Instructions (Addendum)
Medication Instructions:   Your physician recommends that you continue on your current medications as directed. Please refer to the Current Medication list given to you today.   *If you need a refill on your cardiac medications before your next appointment, please call your pharmacy*   Lab Work: BMET AND CBC TODAY   If you have labs (blood work) drawn today and your tests are completely normal, you will receive your results only by: [place] (if you have MyChart) OR A paper copy in the mail If you have any lab test that is abnormal or we need to change your treatment, we will call you to review the results.   Testing/Procedures: Your physician has requested that you have an echocardiogram. Echocardiography is a painless test that uses sound waves to create images of your heart. It provides your doctor with information about the size and shape of your heart and how well your heart's chambers and valves are working. This procedure takes approximately one hour. There are no restrictions for this procedure. Please do NOT wear cologne, perfume, aftershave, or lotions (deodorant is allowed). Please arrive 15 minutes prior to your appointment time.      Follow-Up: At [facility], you and your health needs are our priority.  As part of our continuing mission to provide you with exceptional heart care, we have created designated Provider Care Teams.  These Care Teams include your primary Cardiologist (physician) and Advanced Practice Providers (APPs -  Physician Assistants and Nurse Practitioners) who all work together to provide you with the care you need, when you need it.  We recommend signing up for the patient portal called "MyChart".  Sign up information is provided on this After Visit Summary.  MyChart is used to connect with patients for Virtual Visits (Telemedicine).  Patients are able to view lab/test results, encounter notes, upcoming appointments, etc.  Non-urgent  messages can be sent to your provider as well.   To learn more about what you can do with MyChart, go to [email or web address].    Your next appointment:   2 month(s)  The format for your next appointment:   In Person  Provider:   You may see Dr. Lovena Le  or one of the following Advanced Practice Providers on your designated Care Team:   Tommye Standard, [place] Legrand Como "Jonni Sanger" Chalmers Cater, [place]   Other Instructions   Important Information About Sugar

## 2021-12-10 LAB — BASIC METABOLIC PANEL
BUN/Creatinine Ratio: 16 (ref 10–24)
BUN: 18 mg/dL (ref 8–27)
CO2: 22 mmol/L (ref 20–29)
Calcium: 9.3 mg/dL (ref 8.6–10.2)
Chloride: 106 mmol/L (ref 96–106)
Creatinine, Ser: 1.16 mg/dL (ref 0.76–1.27)
Glucose: 104 mg/dL — ABNORMAL HIGH (ref 70–99)
Potassium: 4.3 mmol/L (ref 3.5–5.2)
Sodium: 142 mmol/L (ref 134–144)
eGFR: 60 mL/min/{1.73_m2} (ref >59)

## 2021-12-10 LAB — CBC
Hematocrit: 40.2 % (ref 37.5–51.0)
Hemoglobin: 13.9 g/dL (ref 13.0–17.7)
MCH: 35.4 pg — ABNORMAL HIGH (ref 26.6–33.0)
MCHC: 34.6 g/dL (ref 31.5–35.7)
MCV: 102 fL — ABNORMAL HIGH (ref 79–97)
Platelets: 153 10*3/uL (ref 150–450)
RBC: 3.93 x10E6/uL — ABNORMAL LOW (ref 4.14–5.80)
RDW: 12.2 % (ref 11.6–15.4)
WBC: 9.1 10*3/uL (ref 3.4–10.8)

## 2021-12-13 ENCOUNTER — Ambulatory Visit: Payer: Medicare HMO | Admitting: Urology

## 2021-12-13 VITALS — BP 102/69 | HR 77

## 2021-12-13 DIAGNOSIS — R351 Nocturia: Secondary | ICD-10-CM

## 2021-12-13 DIAGNOSIS — N401 Enlarged prostate with lower urinary tract symptoms: Secondary | ICD-10-CM

## 2021-12-13 DIAGNOSIS — N138 Other obstructive and reflux uropathy: Secondary | ICD-10-CM | POA: Diagnosis not present

## 2021-12-13 LAB — URINALYSIS, ROUTINE W REFLEX MICROSCOPIC
Bilirubin, UA: NEGATIVE
Glucose, UA: NEGATIVE
Ketones, UA: NEGATIVE
Leukocytes,UA: NEGATIVE
Nitrite, UA: NEGATIVE
Protein,UA: NEGATIVE
Specific Gravity, UA: 1.01 (ref 1.005–1.030)
Urobilinogen, Ur: 0.2 mg/dL (ref 0.2–1.0)
pH, UA: 5.5 (ref 5.0–7.5)

## 2021-12-13 LAB — MICROSCOPIC EXAMINATION
Bacteria, UA: NONE SEEN
Epithelial Cells (non renal): NONE SEEN /hpf (ref 0–10)
WBC, UA: NONE SEEN /hpf (ref 0–5)

## 2021-12-13 MED ORDER — ALFUZOSIN HCL ER 10 MG PO TB24: 10.0000 mg | ORAL_TABLET | Freq: Every day | ORAL | 3 refills | Status: DC

## 2021-12-13 NOTE — Progress Notes (Unsigned)
12/13/2021 11:43 AM   Omar Person Jun 22, 1930 [id number]  Referring provider: Celene Squibb, MD [address] [place],  [place] [address]  No chief complaint on file.   HPI: IPSS 3 QOl 0 on uroxatral '10mg'$  qhs. Urine stream strong. Nocturia 0-1x. No straining to urinate   PMH: Past Medical History:  Diagnosis Date   AAA (abdominal aortic aneurysm) ([place])    Adenomatous colon polyp 2000   Surveillance due 2017   Atrial fibrillation (HCC)    Atrial flutter ([place])    a. 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   Left Shoulder (curet)   Nodular basal cell carcinoma 11/11/2017   Right Post Auricular (treatment after biopsy)   Nodular basal cell carcinoma 09/28/2018   Right Sideburn (treatment after biopsy)   Nodular basal cell carcinoma 09/28/2018   Right Front Neck (treatment after biopsy)   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   in situ- left neck (Cx35FU)   SCC (squamous cell carcinoma) 10/26/2014   Right Ear(in situ) (curet, cautery, and 5FU)   SCC (squamous cell carcinoma) 10/26/2014   Right Upper Lip(in situ) (LN2 60 second freeze)   SCC (squamous cell carcinoma) 07/01/2017   Left Sideburn(in situ) (treatment after biopsy)   SCC (squamous cell carcinoma) 07/01/2017   Right Inner Cheek(in situ) (treatment after biopsy)   SCC (squamous cell carcinoma) 11/11/2017   Right Inner Cheek Inf.(in situ) (treatment after biopsy)    SCC (squamous cell carcinoma) 09/28/2018   Right Helix Mid(in situ) (treatment after biopsy)   SCC (squamous cell carcinoma) 09/28/2018   Right Inner Cheek,Inf(in situ) (treatment after biopsy)   SCC (squamous cell carcinoma) 04/27/2019   Tip of right nose(in situ) - CX3+5FU   Squamous cell carcinoma of skin 10/26/2014   Below Left Ear(in situ) (curet, cautery, and 5FU)   Superficial basal cell carcinoma 10/26/2014   Top Right Foot (treatment after biopsy)   Superficial nodular basal cell carcinoma 10/26/2014   Right Forearm (treatment after biopsy)    Surgical History: Past Surgical History:  Procedure Laterality Date   BIOPSY  10/24/2016   Procedure: BIOPSY;  Surgeon: Daneil Dolin, MD;  [facility];  Service: Endoscopy;;  colon   BIOPSY  01/06/2018   Procedure: BIOPSY;  Surgeon: Rogene Houston, MD;  [facility];  Service: Endoscopy;;  duodenal   COLONOSCOPY  07/2010   Dr. Gala Romney: adenomatous polyps, due for surveillance if health permits in 3662   COLONOSCOPY N/A 10/24/2016   Rourk: diverticulosis in entire examined colon, 79m polyp tubular adenoma, 563mpolyp tubulovillous adenoma, 121molyp bening colonic mucosa, internal hemmorhoids   ESOPHAGOGASTRODUODENOSCOPY N/A 05/24/2013   Dr. Rourk:Prepyloric gastric ulcerations-likely source of hematemesis-likely NSAID-related. H.PYLORI SEROLOGY POSITIVE, TREATED WITH PREVPAC   ESOPHAGOGASTRODUODENOSCOPY N/A 05/23/2013   RMRHUT:[id number] secondary to much clot and food debris in the stomach  ESOPHAGOGASTRODUODENOSCOPY N/A 08/16/2013   Dr. Gala Romney: normal esophagus, stomach empty, deformity of antrum. scar present. Previously noted ulcers completed healed. Patent pylorus, normal D1 and D2   ESOPHAGOGASTRODUODENOSCOPY N/A 01/06/2018   Procedure: ESOPHAGOGASTRODUODENOSCOPY (EGD);  Surgeon: Rogene Houston, MD;  [facility];  Service: Endoscopy;  Laterality: N/A;   HEMORRHOID SURGERY     PACEMAKER  IMPLANT N/A 11/15/2020   Procedure: PACEMAKER IMPLANT;  Surgeon: Evans Lance, MD;  Location: [facility];  Service: Cardiovascular;  Laterality: N/A;   POLYPECTOMY  10/24/2016   Procedure: POLYPECTOMY;  Surgeon: Daneil Dolin, MD;  [facility];  Service: Endoscopy;;  colon   shoulder      Home Medications:  Allergies as of 12/13/2021   No Known Allergies      Medication List        Accurate as of December 13, 2021 11:43 AM. If you have any questions, ask your nurse or doctor.          acetaminophen 500 MG tablet Commonly known as: TYLENOL Take 1,000 mg by mouth at bedtime. May take additional  1000 mg of needed   alfuzosin 10 MG 24 hr tablet Commonly known as: UROXATRAL Take 1 tablet (10 mg total) by mouth at bedtime.   Eliquis 5 MG Tabs tablet Generic drug: apixaban TAKE 1 TABLET BY MOUTH TWICE A DAY   ipratropium 0.06 % nasal spray Commonly known as: ATROVENT Place 2 sprays into both nostrils daily as needed for rhinitis.   LACTOBACILLUS PO Take 1 tablet by mouth daily.   rOPINIRole 1 MG tablet Commonly known as: REQUIP Take 1 mg by mouth at bedtime.   simvastatin 10 MG tablet Commonly known as: ZOCOR Take 1 tablet (10 mg total) by mouth daily.   SYSTANE OP Apply 1 drop to eye 4 (four) times daily as needed (dry eyes).   verapamil 180 MG CR tablet Commonly known as: CALAN-SR Take 1 tablet (180 mg total) by mouth 2 (two) times daily.   Vitamin D3 50 MCG (2000 UT) Tabs Take 6,000 Units by mouth daily.        Allergies: No Known Allergies  Family History: Family History  Problem Relation Age of Onset   Stroke Mother    Stroke Father    Pancreatic cancer Sister    Leukemia Brother    Lung cancer Sister    Colon cancer Neg Hx     Social History:  reports that he quit smoking about 21 years ago. His smoking use included cigarettes. He has a 100.00 pack-year smoking history. He quit smokeless tobacco use about 22 years  ago. He reports that he does not drink alcohol and does not use drugs.  ROS: All other review of systems were reviewed and are negative except what is noted above in HPI  Physical Exam: BP 102/69   Pulse 77   Constitutional:  Alert and oriented, No acute distress. HEENT: [place] AT, moist mucus membranes.  Trachea midline, no masses. Cardiovascular: No clubbing, cyanosis, or edema. Respiratory: Normal respiratory effort, no increased work of breathing. GI: Abdomen is soft, nontender, nondistended, no abdominal masses GU: No CVA tenderness.  Lymph: No cervical or inguinal lymphadenopathy. Skin: No rashes, bruises or suspicious lesions. Neurologic: Grossly intact, no focal deficits, moving all 4 extremities. Psychiatric: Normal mood and affect.  Laboratory Data: Lab Results  Component Value Date   WBC 9.1 12/09/2021   HGB 13.9 12/09/2021   HCT 40.2 12/09/2021  MCV 102 (H) 12/09/2021   PLT 153 12/09/2021    Lab Results  Component Value Date   CREATININE 1.16 12/09/2021    No results found for: "PSA"  No results found for: "TESTOSTERONE"  No results found for: "HGBA1C"  Urinalysis    Component Value Date/Time   APPEARANCEUR Clear 12/11/2020 1317   GLUCOSEU Negative 12/11/2020 1317   BILIRUBINUR Negative 12/11/2020 1317   PROTEINUR Negative 12/11/2020 1317   UROBILINOGEN negative (A) 09/07/2019 1109   NITRITE Negative 12/11/2020 1317   LEUKOCYTESUR Negative 12/11/2020 1317    Lab Results  Component Value Date   LABMICR Comment 12/11/2020    Pertinent Imaging: *** No results found for this or any previous visit.  No results found for this or any previous visit.  No results found for this or any previous visit.  No results found for this or any previous visit.  No results found for this or any previous visit.  No valid procedures specified. No results found for this or any previous visit.  No results found for this or any previous visit.   Assessment &  Plan:    1. Benign prostatic hyperplasia with urinary obstruction -continue uroxatral '10mg'$  qhs - Urinalysis, Routine w reflex microscopic  2. Nocturia -COntinue uroxatral '10mg'$     No follow-ups on file.  Nicolette Bang, MD  [facility] [place]

## 2021-12-19 ENCOUNTER — Encounter: Payer: Self-pay | Admitting: Urology

## 2021-12-19 DIAGNOSIS — E669 Obesity, unspecified: Secondary | ICD-10-CM | POA: Diagnosis not present

## 2021-12-19 DIAGNOSIS — D6869 Other thrombophilia: Secondary | ICD-10-CM | POA: Diagnosis not present

## 2021-12-19 DIAGNOSIS — G2581 Restless legs syndrome: Secondary | ICD-10-CM | POA: Diagnosis not present

## 2021-12-19 DIAGNOSIS — Z85828 Personal history of other malignant neoplasm of skin: Secondary | ICD-10-CM | POA: Diagnosis not present

## 2021-12-19 DIAGNOSIS — I10 Essential (primary) hypertension: Secondary | ICD-10-CM | POA: Diagnosis not present

## 2021-12-19 DIAGNOSIS — I4891 Unspecified atrial fibrillation: Secondary | ICD-10-CM | POA: Diagnosis not present

## 2021-12-19 DIAGNOSIS — J302 Other seasonal allergic rhinitis: Secondary | ICD-10-CM | POA: Diagnosis not present

## 2021-12-19 DIAGNOSIS — Z7901 Long term (current) use of anticoagulants: Secondary | ICD-10-CM | POA: Diagnosis not present

## 2021-12-19 DIAGNOSIS — Z6831 Body mass index (BMI) 31.0-31.9, adult: Secondary | ICD-10-CM | POA: Diagnosis not present

## 2021-12-19 DIAGNOSIS — Z823 Family history of stroke: Secondary | ICD-10-CM | POA: Diagnosis not present

## 2021-12-19 DIAGNOSIS — G3184 Mild cognitive impairment, so stated: Secondary | ICD-10-CM | POA: Diagnosis not present

## 2021-12-19 DIAGNOSIS — E785 Hyperlipidemia, unspecified: Secondary | ICD-10-CM | POA: Diagnosis not present

## 2021-12-19 NOTE — Patient Instructions (Signed)

## 2021-12-20 ENCOUNTER — Other Ambulatory Visit: Payer: Self-pay | Admitting: *Deleted

## 2021-12-20 MED ORDER — FUROSEMIDE 20 MG PO TABS: 20.0000 mg | ORAL_TABLET | ORAL | 2 refills | Status: DC

## 2021-12-26 ENCOUNTER — Ambulatory Visit (HOSPITAL_COMMUNITY): Payer: Medicare HMO | Attending: Cardiology

## 2021-12-26 DIAGNOSIS — R011 Cardiac murmur, unspecified: Secondary | ICD-10-CM

## 2021-12-26 LAB — ECHOCARDIOGRAM COMPLETE: S' Lateral: 4.5 cm

## 2021-12-30 ENCOUNTER — Other Ambulatory Visit: Payer: Self-pay | Admitting: *Deleted

## 2021-12-30 MED ORDER — LOSARTAN POTASSIUM 25 MG PO TABS: 25.0000 mg | ORAL_TABLET | Freq: Every day | ORAL | 3 refills | Status: DC

## 2022-01-07 DIAGNOSIS — Z08 Encounter for follow-up examination after completed treatment for malignant neoplasm: Secondary | ICD-10-CM | POA: Diagnosis not present

## 2022-01-07 DIAGNOSIS — D1801 Hemangioma of skin and subcutaneous tissue: Secondary | ICD-10-CM | POA: Diagnosis not present

## 2022-01-07 DIAGNOSIS — L821 Other seborrheic keratosis: Secondary | ICD-10-CM | POA: Diagnosis not present

## 2022-01-07 DIAGNOSIS — L570 Actinic keratosis: Secondary | ICD-10-CM | POA: Diagnosis not present

## 2022-01-07 DIAGNOSIS — L814 Other melanin hyperpigmentation: Secondary | ICD-10-CM | POA: Diagnosis not present

## 2022-01-07 DIAGNOSIS — D485 Neoplasm of uncertain behavior of skin: Secondary | ICD-10-CM | POA: Diagnosis not present

## 2022-01-07 DIAGNOSIS — L989 Disorder of the skin and subcutaneous tissue, unspecified: Secondary | ICD-10-CM | POA: Diagnosis not present

## 2022-01-07 DIAGNOSIS — Z85828 Personal history of other malignant neoplasm of skin: Secondary | ICD-10-CM | POA: Diagnosis not present

## 2022-01-23 DIAGNOSIS — C44719 Basal cell carcinoma of skin of left lower limb, including hip: Secondary | ICD-10-CM | POA: Diagnosis not present

## 2022-02-04 ENCOUNTER — Encounter: Payer: Self-pay | Admitting: Internal Medicine

## 2022-02-04 ENCOUNTER — Ambulatory Visit: Payer: Medicare HMO | Attending: Internal Medicine | Admitting: Internal Medicine

## 2022-02-04 VITALS — BP 118/70 | HR 68 | Ht 69.0 in | Wt 224.0 lb

## 2022-02-04 DIAGNOSIS — R55 Syncope and collapse: Secondary | ICD-10-CM | POA: Diagnosis not present

## 2022-02-04 DIAGNOSIS — I4891 Unspecified atrial fibrillation: Secondary | ICD-10-CM

## 2022-02-04 LAB — CUP PACEART INCLINIC DEVICE CHECK
Date Time Interrogation Session: 20231212135428
Implantable Lead Connection Status: 753985
Implantable Lead Connection Status: 753985
Implantable Lead Implant Date: 20220922
Implantable Lead Implant Date: 20220922
Implantable Lead Location: 753859
Implantable Lead Location: 753860
Implantable Pulse Generator Implant Date: 20220922
Pulse Gen Model: 2272
Pulse Gen Serial Number: 3964503

## 2022-02-04 NOTE — Progress Notes (Signed)
HPI Juan Quinn returns today for followup. He is a pleasant 88 yo man with a h/o PAF and symptomatic tachy-brady syndrome who underwent PPM insertion over 13 months ago. The patient denies chest pain and has rare palpitations. He has some dyspnea with exertion. His diarrhea improved with verapamil.  He has had progressive atrial fib. No Known Allergies   Current Outpatient Medications  Medication Sig Dispense Refill   acetaminophen (TYLENOL) 500 MG tablet Take 1,000 mg by mouth at bedtime. May take additional  1000 mg of needed     alfuzosin (UROXATRAL) 10 MG 24 hr tablet Take 1 tablet (10 mg total) by mouth at bedtime. 90 tablet 3   Cholecalciferol (VITAMIN D3) 2000 units TABS Take 6,000 Units by mouth daily.     ELIQUIS 5 MG TABS tablet TAKE 1 TABLET BY MOUTH TWICE A DAY 180 tablet 1   furosemide (LASIX) 20 MG tablet Take 1 tablet (20 mg total) by mouth every other day. 48 tablet 2   ipratropium (ATROVENT) 0.06 % nasal spray Place 2 sprays into both nostrils daily as needed for rhinitis.      LACTOBACILLUS PO Take 1 tablet by mouth daily.     losartan (COZAAR) 25 MG tablet Take 1 tablet (25 mg total) by mouth daily. 90 tablet 3   Polyethyl Glycol-Propyl Glycol (SYSTANE OP) Apply 1 drop to eye 4 (four) times daily as needed (dry eyes).     rOPINIRole (REQUIP) 1 MG tablet Take 1 mg by mouth at bedtime.     simvastatin (ZOCOR) 10 MG tablet Take 1 tablet (10 mg total) by mouth daily. 90 tablet 3   verapamil (CALAN-SR) 180 MG CR tablet Take 1 tablet (180 mg total) by mouth 2 (two) times daily. 180 tablet 3   Ferrous Sulfate (IRON) 325 (65 Fe) MG TABS Take by mouth daily at 6 (six) AM. (Patient not taking: Reported on 02/04/2022)     Current Facility-Administered Medications  Medication Dose Route Frequency Provider Last Rate Last Admin   triamcinolone acetonide (KENALOG-40) injection 40 mg  40 mg Intramuscular Once Lavonna Monarch, MD         Past Medical History:  Diagnosis Date    AAA (abdominal aortic aneurysm) ([place])    Adenomatous colon polyp 2000   Surveillance due 2017   Atrial fibrillation ([place])    Atrial flutter ([place])    a. diagnosed in 12/2017   Basal cell carcinoma 08/15/2020   left knee- anterior (CX35FU)   Basal cell carcinoma 08/15/2020   left forearm-posterior (CX35FU)   Basal cell carcinoma 01/23/2022   BCC (basal cell carcinoma of skin) 04/27/2019   Top Right Shoulder (curet, cautery and 5FU)   BCC (basal cell carcinoma of skin) 08/15/2020   left preauricular area (Cx35FU)   Chronic diarrhea    Gout    Heart murmur    Helicobacter pylori antibody positive 2015   treatment with Prevpac   History of stomach ulcers    HTN (hypertension)    Hypercholesteremia    Nodular basal cell carcinoma 07/17/2016   Left Shoulder (curet)   Nodular basal cell carcinoma 11/11/2017   Right Post Auricular (treatment after biopsy)   Nodular basal cell carcinoma 09/28/2018   Right Sideburn (treatment after biopsy)   Nodular basal cell carcinoma 09/28/2018   Right Front Neck (treatment after biopsy)   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   in situ- left neck (Cx35FU)   SCC (squamous cell  carcinoma) 10/26/2014   Right Ear(in situ) (curet, cautery, and 5FU)   SCC (squamous cell carcinoma) 10/26/2014   Right Upper Lip(in situ) (LN2 60 second freeze)   SCC (squamous cell carcinoma) 07/01/2017   Left Sideburn(in situ) (treatment after biopsy)   SCC (squamous cell carcinoma) 07/01/2017   Right Inner Cheek(in situ) (treatment after biopsy)   SCC (squamous cell carcinoma) 11/11/2017   Right Inner Cheek Inf.(in situ) (treatment after biopsy)   SCC (squamous cell carcinoma) 09/28/2018   Right Helix Mid(in situ) (treatment after biopsy)   SCC (squamous cell carcinoma) 09/28/2018   Right Inner Cheek,Inf(in situ) (treatment after biopsy)   SCC (squamous cell carcinoma) 04/27/2019   Tip of right nose(in situ) - CX3+5FU   Squamous cell  carcinoma of skin 10/26/2014   Below Left Ear(in situ) (curet, cautery, and 5FU)   Superficial basal cell carcinoma 10/26/2014   Top Right Foot (treatment after biopsy)   Superficial nodular basal cell carcinoma 10/26/2014   Right Forearm (treatment after biopsy)    ROS:   All systems reviewed and negative except as noted in the HPI.   Past Surgical History:  Procedure Laterality Date   BIOPSY  10/24/2016   Procedure: BIOPSY;  Surgeon: Daneil Dolin, MD;  [facility];  Service: Endoscopy;;  colon   BIOPSY  01/06/2018   Procedure: BIOPSY;  Surgeon: Rogene Houston, MD;  [facility];  Service: Endoscopy;;  duodenal   COLONOSCOPY  07/2010   Dr. Gala Romney: adenomatous polyps, due for surveillance if health permits in 3382   COLONOSCOPY N/A 10/24/2016   Rourk: diverticulosis in entire examined colon, 13m polyp tubular adenoma, 540mpolyp tubulovillous adenoma, 1285molyp bening colonic mucosa, internal hemmorhoids   ESOPHAGOGASTRODUODENOSCOPY N/A 05/24/2013   Dr. Rourk:Prepyloric gastric ulcerations-likely source of hematemesis-likely NSAID-related. H.PYLORI SEROLOGY POSITIVE, TREATED WITH PREVPAC   ESOPHAGOGASTRODUODENOSCOPY N/A 05/23/2013   RMRNKN:[id number] secondary to much clot and food debris in the stomach   ESOPHAGOGASTRODUODENOSCOPY N/A 08/16/2013   Dr. RouGala Romneyormal esophagus, stomach empty, deformity of antrum. scar present. Previously noted ulcers completed healed. Patent pylorus, normal D1 and D2   ESOPHAGOGASTRODUODENOSCOPY N/A 01/06/2018   Procedure: ESOPHAGOGASTRODUODENOSCOPY (EGD);  Surgeon: RehRogene HoustonD;  [facility];  Service: Endoscopy;  Laterality: N/A;   HEMORRHOID SURGERY     PACEMAKER IMPLANT N/A 11/15/2020   Procedure: PACEMAKER IMPLANT;  Surgeon: TayEvans LanceD;  Location: [facility];  Service: Cardiovascular;  Laterality: N/A;   POLYPECTOMY  10/24/2016   Procedure: POLYPECTOMY;  Surgeon: RouDaneil DolinMD;  [facility];  Service: Endoscopy;;  colon   shoulder       Family History  Problem Relation Age of Onset   Stroke Mother    Stroke Father    Pancreatic cancer Sister    Leukemia Brother    Lung cancer Sister    Colon cancer Neg Hx      Social History   Socioeconomic History   Marital status: Married    Spouse name: Not on file   Number of children: 4   Years of education: Not on file   Highest education level: Not on file  Occupational History   Occupation: retired    Comment: [employer] photograp[employer] Use   Smoking status: Former    Packs/day: 2.00    Years: 50.00    Total pack years: 100.00    Types: Cigarettes    Quit date: 07/28/2000  Years since quitting: 21.5   Smokeless tobacco: Former    Quit date: 02/25/1999  Vaping Use   Vaping Use: Never used  Substance and Sexual Activity   Alcohol use: No    Comment: couple glasses wine/week, occ beer or rmixed drink   Drug use: No   Sexual activity: Not on file  Other Topics Concern   Not on file  Social History Narrative   Lives w/ wife         Social Determinants of Health   Financial Resource Strain: Not on file  Food Insecurity: Not on file  Transportation Needs: Not on file  Physical Activity: Not on file  Stress: Not on file  Social Connections: Not on file  Intimate Partner Violence: Not on file     BP 118/70   Pulse 68   Ht '5\' 9"'$  (1.753 m)   Wt 224 lb (101.6 kg)   SpO2 94%   BMI 33.08 kg/m   Physical Exam:  Well appearing NAD HEENT: Unremarkable Neck:  No JVD, no thyromegally Lymphatics:  No adenopathy Back:  No CVA tenderness Lungs:  Clear HEART:  Regular rate rhythm, no murmurs, no rubs, no clicks Abd:  soft, positive bowel sounds, no organomegally, no rebound, no guarding Ext:  2 plus pulses, no edema, no cyanosis, no clubbing Skin:  No rashes no nodules Neuro:  CN II through XII intact, motor grossly intact  EKG  DEVICE  Normal device function.  See PaceArt for  details.   Assess/Plan:  Symptomatic tachy-brady syndrome - he is improved after PPM but rates are still not well controlled. I have recommended he uptitrate his verapamil. We discussed rhythm control as well. We also discussed digoxin. However for now he will increase the verapamil to 360 mg daily in divided doses. I discussed AA drug therapy but ultimately decided not to try amiodarone. Coags -he has had no bleeding on eliquis.  Near syncope - he has not had any symptoms since his PPM insertion.  Sinus node dysfunction - he is now mostly in atrial fib and he will undergo watchful waiting.   Juan Overlie Arryana Tolleson,MD

## 2022-02-04 NOTE — Patient Instructions (Signed)
Medication Instructions:  Your physician recommends that you continue on your current medications as directed. Please refer to the Current Medication list given to you today.   Labwork: None today  Testing/Procedures: None today  Follow-Up: 1 year  Any Other Special Instructions Will Be Listed Below (If Applicable).  If you need a refill on your cardiac medications before your next appointment, please call your pharmacy.  

## 2022-02-07 DIAGNOSIS — R413 Other amnesia: Secondary | ICD-10-CM | POA: Diagnosis not present

## 2022-02-13 ENCOUNTER — Ambulatory Visit (INDEPENDENT_AMBULATORY_CARE_PROVIDER_SITE_OTHER): Payer: Medicare HMO

## 2022-02-13 DIAGNOSIS — I495 Sick sinus syndrome: Secondary | ICD-10-CM

## 2022-02-13 LAB — CUP PACEART REMOTE DEVICE CHECK
Battery Remaining Longevity: 109 mo
Battery Remaining Percentage: 92 %
Battery Voltage: 3.01 V
Brady Statistic AP VP Percent: 1 %
Brady Statistic AP VS Percent: 1 %
Brady Statistic AS VP Percent: 7.8 %
Brady Statistic AS VS Percent: 92 %
Brady Statistic RA Percent Paced: 1 %
Brady Statistic RV Percent Paced: 6.8 %
Date Time Interrogation Session: 20231221034258
Implantable Lead Connection Status: 753985
Implantable Lead Connection Status: 753985
Implantable Lead Implant Date: 20220922
Implantable Lead Implant Date: 20220922
Implantable Lead Location: 753859
Implantable Lead Location: 753860
Implantable Pulse Generator Implant Date: 20220922
Lead Channel Impedance Value: 480 Ohm
Lead Channel Impedance Value: 530 Ohm
Lead Channel Pacing Threshold Amplitude: 1 V
Lead Channel Pacing Threshold Amplitude: 1.5 V
Lead Channel Pacing Threshold Pulse Width: 0.4 ms
Lead Channel Pacing Threshold Pulse Width: 0.4 ms
Lead Channel Sensing Intrinsic Amplitude: 11.1 mV
Lead Channel Sensing Intrinsic Amplitude: 5 mV
Lead Channel Setting Pacing Amplitude: 2 V
Lead Channel Setting Pacing Amplitude: 3 V
Lead Channel Setting Pacing Pulse Width: 0.4 ms
Lead Channel Setting Sensing Sensitivity: 2 mV
Pulse Gen Model: 2272
Pulse Gen Serial Number: 3964503

## 2022-02-21 ENCOUNTER — Other Ambulatory Visit: Payer: Self-pay | Admitting: Internal Medicine

## 2022-03-06 NOTE — Progress Notes (Signed)
Remote pacemaker transmission.   

## 2022-05-02 ENCOUNTER — Encounter: Payer: Self-pay | Admitting: Internal Medicine

## 2022-05-05 ENCOUNTER — Other Ambulatory Visit: Payer: Self-pay

## 2022-05-05 MED ORDER — FUROSEMIDE 20 MG PO TABS: 20.0000 mg | ORAL_TABLET | Freq: Every day | ORAL | 2 refills | Status: DC

## 2022-05-15 ENCOUNTER — Ambulatory Visit (INDEPENDENT_AMBULATORY_CARE_PROVIDER_SITE_OTHER): Payer: Medicare HMO

## 2022-05-15 DIAGNOSIS — I495 Sick sinus syndrome: Secondary | ICD-10-CM

## 2022-05-15 LAB — CUP PACEART REMOTE DEVICE CHECK
Battery Remaining Longevity: 106 mo
Battery Remaining Percentage: 89 %
Battery Voltage: 3.01 V
Brady Statistic AP VP Percent: 1 %
Brady Statistic AP VS Percent: 1 %
Brady Statistic AS VP Percent: 7.1 %
Brady Statistic AS VS Percent: 92 %
Brady Statistic RA Percent Paced: 1 %
Brady Statistic RV Percent Paced: 7.7 %
Date Time Interrogation Session: 20240321020015
Implantable Lead Connection Status: 753985
Implantable Lead Connection Status: 753985
Implantable Lead Implant Date: 20220922
Implantable Lead Implant Date: 20220922
Implantable Lead Location: 753859
Implantable Lead Location: 753860
Implantable Pulse Generator Implant Date: 20220922
Lead Channel Impedance Value: 490 Ohm
Lead Channel Impedance Value: 530 Ohm
Lead Channel Pacing Threshold Amplitude: 1 V
Lead Channel Pacing Threshold Amplitude: 1.5 V
Lead Channel Pacing Threshold Pulse Width: 0.4 ms
Lead Channel Pacing Threshold Pulse Width: 0.4 ms
Lead Channel Sensing Intrinsic Amplitude: 12 mV
Lead Channel Sensing Intrinsic Amplitude: 5 mV
Lead Channel Setting Pacing Amplitude: 2 V
Lead Channel Setting Pacing Amplitude: 3 V
Lead Channel Setting Pacing Pulse Width: 0.4 ms
Lead Channel Setting Sensing Sensitivity: 2 mV
Pulse Gen Model: 2272
Pulse Gen Serial Number: 3964503

## 2022-05-23 DIAGNOSIS — N529 Male erectile dysfunction, unspecified: Secondary | ICD-10-CM | POA: Diagnosis not present

## 2022-05-23 DIAGNOSIS — N400 Enlarged prostate without lower urinary tract symptoms: Secondary | ICD-10-CM | POA: Diagnosis not present

## 2022-05-23 DIAGNOSIS — I4891 Unspecified atrial fibrillation: Secondary | ICD-10-CM | POA: Diagnosis not present

## 2022-05-23 DIAGNOSIS — E669 Obesity, unspecified: Secondary | ICD-10-CM | POA: Diagnosis not present

## 2022-05-23 DIAGNOSIS — M1990 Unspecified osteoarthritis, unspecified site: Secondary | ICD-10-CM | POA: Diagnosis not present

## 2022-05-23 DIAGNOSIS — N182 Chronic kidney disease, stage 2 (mild): Secondary | ICD-10-CM | POA: Diagnosis not present

## 2022-05-23 DIAGNOSIS — D6869 Other thrombophilia: Secondary | ICD-10-CM | POA: Diagnosis not present

## 2022-05-23 DIAGNOSIS — R69 Illness, unspecified: Secondary | ICD-10-CM | POA: Diagnosis not present

## 2022-05-23 DIAGNOSIS — Z008 Encounter for other general examination: Secondary | ICD-10-CM | POA: Diagnosis not present

## 2022-05-23 DIAGNOSIS — H3530 Unspecified macular degeneration: Secondary | ICD-10-CM | POA: Diagnosis not present

## 2022-05-23 DIAGNOSIS — E785 Hyperlipidemia, unspecified: Secondary | ICD-10-CM | POA: Diagnosis not present

## 2022-05-23 DIAGNOSIS — F0390 Unspecified dementia without behavioral disturbance: Secondary | ICD-10-CM | POA: Diagnosis not present

## 2022-05-23 DIAGNOSIS — R32 Unspecified urinary incontinence: Secondary | ICD-10-CM | POA: Diagnosis not present

## 2022-05-23 DIAGNOSIS — R2681 Unsteadiness on feet: Secondary | ICD-10-CM | POA: Diagnosis not present

## 2022-06-09 DIAGNOSIS — I482 Chronic atrial fibrillation, unspecified: Secondary | ICD-10-CM | POA: Diagnosis not present

## 2022-06-09 DIAGNOSIS — Z0000 Encounter for general adult medical examination without abnormal findings: Secondary | ICD-10-CM | POA: Diagnosis not present

## 2022-06-09 DIAGNOSIS — I10 Essential (primary) hypertension: Secondary | ICD-10-CM | POA: Diagnosis not present

## 2022-06-09 DIAGNOSIS — Z125 Encounter for screening for malignant neoplasm of prostate: Secondary | ICD-10-CM | POA: Diagnosis not present

## 2022-06-09 DIAGNOSIS — E538 Deficiency of other specified B group vitamins: Secondary | ICD-10-CM | POA: Diagnosis not present

## 2022-06-09 DIAGNOSIS — E782 Mixed hyperlipidemia: Secondary | ICD-10-CM | POA: Diagnosis not present

## 2022-06-12 DIAGNOSIS — R7301 Impaired fasting glucose: Secondary | ICD-10-CM | POA: Diagnosis not present

## 2022-06-12 DIAGNOSIS — R059 Cough, unspecified: Secondary | ICD-10-CM | POA: Diagnosis not present

## 2022-06-12 DIAGNOSIS — I10 Essential (primary) hypertension: Secondary | ICD-10-CM | POA: Diagnosis not present

## 2022-06-12 DIAGNOSIS — N401 Enlarged prostate with lower urinary tract symptoms: Secondary | ICD-10-CM | POA: Diagnosis not present

## 2022-06-12 DIAGNOSIS — I482 Chronic atrial fibrillation, unspecified: Secondary | ICD-10-CM | POA: Diagnosis not present

## 2022-06-12 DIAGNOSIS — R413 Other amnesia: Secondary | ICD-10-CM | POA: Diagnosis not present

## 2022-06-12 DIAGNOSIS — I700 Atherosclerosis of aorta: Secondary | ICD-10-CM | POA: Diagnosis not present

## 2022-06-12 DIAGNOSIS — E538 Deficiency of other specified B group vitamins: Secondary | ICD-10-CM | POA: Diagnosis not present

## 2022-06-12 DIAGNOSIS — G2581 Restless legs syndrome: Secondary | ICD-10-CM | POA: Diagnosis not present

## 2022-06-12 DIAGNOSIS — D696 Thrombocytopenia, unspecified: Secondary | ICD-10-CM | POA: Diagnosis not present

## 2022-06-12 DIAGNOSIS — E782 Mixed hyperlipidemia: Secondary | ICD-10-CM | POA: Diagnosis not present

## 2022-06-12 DIAGNOSIS — R197 Diarrhea, unspecified: Secondary | ICD-10-CM | POA: Diagnosis not present

## 2022-06-18 NOTE — Progress Notes (Signed)
Remote pacemaker transmission.   

## 2022-06-19 DIAGNOSIS — L820 Inflamed seborrheic keratosis: Secondary | ICD-10-CM | POA: Diagnosis not present

## 2022-07-08 DIAGNOSIS — D485 Neoplasm of uncertain behavior of skin: Secondary | ICD-10-CM | POA: Diagnosis not present

## 2022-07-08 DIAGNOSIS — Z08 Encounter for follow-up examination after completed treatment for malignant neoplasm: Secondary | ICD-10-CM | POA: Diagnosis not present

## 2022-07-08 DIAGNOSIS — D225 Melanocytic nevi of trunk: Secondary | ICD-10-CM | POA: Diagnosis not present

## 2022-07-08 DIAGNOSIS — C44329 Squamous cell carcinoma of skin of other parts of face: Secondary | ICD-10-CM | POA: Diagnosis not present

## 2022-07-08 DIAGNOSIS — L821 Other seborrheic keratosis: Secondary | ICD-10-CM | POA: Diagnosis not present

## 2022-07-08 DIAGNOSIS — Z85828 Personal history of other malignant neoplasm of skin: Secondary | ICD-10-CM | POA: Diagnosis not present

## 2022-07-08 DIAGNOSIS — L814 Other melanin hyperpigmentation: Secondary | ICD-10-CM | POA: Diagnosis not present

## 2022-07-08 DIAGNOSIS — C44619 Basal cell carcinoma of skin of left upper limb, including shoulder: Secondary | ICD-10-CM | POA: Diagnosis not present

## 2022-08-14 ENCOUNTER — Ambulatory Visit: Payer: Medicare HMO

## 2022-08-14 DIAGNOSIS — I495 Sick sinus syndrome: Secondary | ICD-10-CM

## 2022-08-14 DIAGNOSIS — L989 Disorder of the skin and subcutaneous tissue, unspecified: Secondary | ICD-10-CM | POA: Diagnosis not present

## 2022-08-14 DIAGNOSIS — C44619 Basal cell carcinoma of skin of left upper limb, including shoulder: Secondary | ICD-10-CM | POA: Diagnosis not present

## 2022-08-14 DIAGNOSIS — C44329 Squamous cell carcinoma of skin of other parts of face: Secondary | ICD-10-CM | POA: Diagnosis not present

## 2022-08-14 LAB — CUP PACEART REMOTE DEVICE CHECK
Battery Remaining Longevity: 104 mo
Battery Remaining Percentage: 87 %
Battery Voltage: 3.01 V
Brady Statistic AP VP Percent: 1 %
Brady Statistic AP VS Percent: 3.8 %
Brady Statistic AS VP Percent: 2.8 %
Brady Statistic AS VS Percent: 93 %
Brady Statistic RA Percent Paced: 1.2 %
Brady Statistic RV Percent Paced: 7.1 %
Date Time Interrogation Session: 20240620020013
Implantable Lead Connection Status: 753985
Implantable Lead Connection Status: 753985
Implantable Lead Implant Date: 20220922
Implantable Lead Implant Date: 20220922
Implantable Lead Location: 753859
Implantable Lead Location: 753860
Implantable Pulse Generator Implant Date: 20220922
Lead Channel Impedance Value: 510 Ohm
Lead Channel Impedance Value: 510 Ohm
Lead Channel Pacing Threshold Amplitude: 1 V
Lead Channel Pacing Threshold Amplitude: 1.5 V
Lead Channel Pacing Threshold Pulse Width: 0.4 ms
Lead Channel Pacing Threshold Pulse Width: 0.4 ms
Lead Channel Sensing Intrinsic Amplitude: 12 mV
Lead Channel Sensing Intrinsic Amplitude: 4.7 mV
Lead Channel Setting Pacing Amplitude: 2 V
Lead Channel Setting Pacing Amplitude: 3 V
Lead Channel Setting Pacing Pulse Width: 0.4 ms
Lead Channel Setting Sensing Sensitivity: 2 mV
Pulse Gen Model: 2272
Pulse Gen Serial Number: 3964503

## 2022-09-02 DIAGNOSIS — R413 Other amnesia: Secondary | ICD-10-CM | POA: Diagnosis not present

## 2022-09-03 NOTE — Progress Notes (Signed)
Remote pacemaker transmission.   

## 2022-10-01 DIAGNOSIS — Z961 Presence of intraocular lens: Secondary | ICD-10-CM | POA: Diagnosis not present

## 2022-10-01 DIAGNOSIS — H0102A Squamous blepharitis right eye, upper and lower eyelids: Secondary | ICD-10-CM | POA: Diagnosis not present

## 2022-10-01 DIAGNOSIS — H0102B Squamous blepharitis left eye, upper and lower eyelids: Secondary | ICD-10-CM | POA: Diagnosis not present

## 2022-10-01 DIAGNOSIS — H353131 Nonexudative age-related macular degeneration, bilateral, early dry stage: Secondary | ICD-10-CM | POA: Diagnosis not present

## 2022-10-01 DIAGNOSIS — H43813 Vitreous degeneration, bilateral: Secondary | ICD-10-CM | POA: Diagnosis not present

## 2022-10-08 DIAGNOSIS — H6123 Impacted cerumen, bilateral: Secondary | ICD-10-CM | POA: Diagnosis not present

## 2022-10-13 ENCOUNTER — Telehealth: Payer: Self-pay | Admitting: Internal Medicine

## 2022-10-13 NOTE — Telephone Encounter (Signed)
Patient's wife is calling because the patient has been sleeping for longer periods. Patient's wife stated yesterday he was wake for about 2 hours and slept for the rest of the day. Patient's wife is concerned and is unsure if this would be related to his heart or his age. Please advise.

## 2022-10-13 NOTE — Telephone Encounter (Signed)
Left message for patient's wife to call back.  

## 2022-10-13 NOTE — Telephone Encounter (Signed)
Left a message to call back.

## 2022-10-13 NOTE — Telephone Encounter (Signed)
Pt's wife returning the nurses call. Please advise

## 2022-10-14 NOTE — Telephone Encounter (Signed)
  Pt's wife returning call. She said, to call her on their home phone number first [phone]

## 2022-10-14 NOTE — Telephone Encounter (Signed)
LVM for patient to send manual transmission, also sent MyChart msg with instructions.

## 2022-10-14 NOTE — Telephone Encounter (Signed)
Spoke w patient's wife.  She said he has been sleeping most of the time for at least the last 6 months.  She wants to know if it could have something to do w his heart.  She has not asked PCP for assistance.  Adv I would ask device clinic to eval for any possible changes to device that may help his energy level, also see if any meds can be changed.  BPs run consistently 90s-100s/50s  I asked her to reach out to Dr. Margo Aye for further recommendations.

## 2022-10-15 NOTE — Telephone Encounter (Addendum)
Reviewed patient's transmission.  Normal Device function. Presenting is AF with Irregular R-R rates 48-89.  Only thing I can correlate with current symptoms are ongoing frequent intermittent periods of AF/RVR.   Per wife, patient is sleeping constantly and very fatigued.  This has been progressing over past 6 months but in recent weeks there is noted change with fatigue, cognition and bowel/bladder patterns.    Dr. Ladona Ridgel discussed patient's RVR at 01/2022 OV. Digoxin and amiodarone considered but patient/wife refused at the time. However, verapamil was increased at that time. Overall burden/histograms show some improvement although there is recent uptick in episodes and rates. Patient has cognitive impairment and is on Aricept per wife.   Patient taking losartan 25mg  daily and Verapamil CR 180mg  bid.  BP reported on average runs 90-105/50's.   Considerations to review with provider:  Patient to be set up for appointment here with PA/ Dr. Ladona Ridgel in next 1-2 weeks to discuss sx's and review rate control options if needed.  Should any med changes be made given blood pressures or symptoms in interim?  Patients wife is going to follow up with PCP for further evaluation of overall cognitive decline which may be more of the underlying issue.

## 2022-10-15 NOTE — Telephone Encounter (Addendum)
Reviewed with Francis Dowse, PA-C. Overall AF burden/rates appear improved. Likely not related to patient's decline.  Recommend that he follow up with PCP ASAP and/or ER if sx's worsen.   I spoke with patient's wife.  I gave above information.  She is hesitant to follow up with the PCP, isn't sure that is the right direction.  I reviewed with her Renee's comments and that PCP would be the best recommendation.

## 2022-10-20 DIAGNOSIS — Z6832 Body mass index (BMI) 32.0-32.9, adult: Secondary | ICD-10-CM | POA: Diagnosis not present

## 2022-10-20 DIAGNOSIS — F0390 Unspecified dementia without behavioral disturbance: Secondary | ICD-10-CM | POA: Diagnosis not present

## 2022-10-20 DIAGNOSIS — Z713 Dietary counseling and surveillance: Secondary | ICD-10-CM | POA: Diagnosis not present

## 2022-11-05 ENCOUNTER — Other Ambulatory Visit: Payer: Self-pay | Admitting: Internal Medicine

## 2022-11-05 NOTE — Telephone Encounter (Signed)
Refill Request.  

## 2022-11-13 ENCOUNTER — Ambulatory Visit (INDEPENDENT_AMBULATORY_CARE_PROVIDER_SITE_OTHER): Payer: Medicare HMO

## 2022-11-13 DIAGNOSIS — I495 Sick sinus syndrome: Secondary | ICD-10-CM | POA: Diagnosis not present

## 2022-11-13 LAB — CUP PACEART REMOTE DEVICE CHECK
Battery Remaining Longevity: 103 mo
Battery Remaining Percentage: 85 %
Battery Voltage: 3.01 V
Brady Statistic AP VP Percent: 1 %
Brady Statistic AP VS Percent: 5.4 %
Brady Statistic AS VP Percent: 2.4 %
Brady Statistic AS VS Percent: 92 %
Brady Statistic RA Percent Paced: 2.3 %
Brady Statistic RV Percent Paced: 7.2 %
Date Time Interrogation Session: 20240919024655
Implantable Lead Connection Status: 753985
Implantable Lead Connection Status: 753985
Implantable Lead Implant Date: 20220922
Implantable Lead Implant Date: 20220922
Implantable Lead Location: 753859
Implantable Lead Location: 753860
Implantable Pulse Generator Implant Date: 20220922
Lead Channel Impedance Value: 530 Ohm
Lead Channel Impedance Value: 530 Ohm
Lead Channel Pacing Threshold Amplitude: 1 V
Lead Channel Pacing Threshold Amplitude: 1.5 V
Lead Channel Pacing Threshold Pulse Width: 0.4 ms
Lead Channel Pacing Threshold Pulse Width: 0.4 ms
Lead Channel Sensing Intrinsic Amplitude: 10.8 mV
Lead Channel Sensing Intrinsic Amplitude: 4.8 mV
Lead Channel Setting Pacing Amplitude: 2 V
Lead Channel Setting Pacing Amplitude: 3 V
Lead Channel Setting Pacing Pulse Width: 0.4 ms
Lead Channel Setting Sensing Sensitivity: 2 mV
Pulse Gen Model: 2272
Pulse Gen Serial Number: 3964503

## 2022-11-24 NOTE — Progress Notes (Signed)
Remote pacemaker transmission.   

## 2022-11-30 ENCOUNTER — Other Ambulatory Visit: Payer: Self-pay | Admitting: Internal Medicine

## 2022-12-05 ENCOUNTER — Other Ambulatory Visit: Payer: Self-pay | Admitting: Physician Assistant

## 2022-12-12 ENCOUNTER — Encounter: Payer: Self-pay | Admitting: Urology

## 2022-12-12 ENCOUNTER — Ambulatory Visit: Payer: Medicare HMO | Admitting: Urology

## 2022-12-12 VITALS — BP 89/55 | HR 76

## 2022-12-12 DIAGNOSIS — R351 Nocturia: Secondary | ICD-10-CM | POA: Diagnosis not present

## 2022-12-12 DIAGNOSIS — N401 Enlarged prostate with lower urinary tract symptoms: Secondary | ICD-10-CM | POA: Diagnosis not present

## 2022-12-12 DIAGNOSIS — N138 Other obstructive and reflux uropathy: Secondary | ICD-10-CM | POA: Diagnosis not present

## 2022-12-12 LAB — URINALYSIS, ROUTINE W REFLEX MICROSCOPIC
Bilirubin, UA: NEGATIVE
Glucose, UA: NEGATIVE
Ketones, UA: NEGATIVE
Leukocytes,UA: NEGATIVE
Nitrite, UA: NEGATIVE
Protein,UA: NEGATIVE
RBC, UA: NEGATIVE
Specific Gravity, UA: 1.01 (ref 1.005–1.030)
Urobilinogen, Ur: 0.2 mg/dL (ref 0.2–1.0)
pH, UA: 6 (ref 5.0–7.5)

## 2022-12-12 MED ORDER — ALFUZOSIN HCL ER 10 MG PO TB24: 10.0000 mg | ORAL_TABLET | Freq: Every day | ORAL | 3 refills | Status: AC

## 2022-12-12 NOTE — Progress Notes (Unsigned)
12/12/2022 12:27 PM   Juan Quinn 05/19/1930 [id number]  Referring provider: Benita Stabile, MD [address] [place],  [place] [address]  Followup BPH and nocturia   HPI: [name] is a 89yo here for followup for BPH with nocturia. IPSS 2 QOL 0 on uroxatral 10mg  at bedtime. Nocturia 1x. Uirne stream strong. No straining to urinate. No hematuria or dysuria. No UTIs in the past year   PMH: Past Medical History:  Diagnosis Date   AAA (abdominal aortic aneurysm) ([facility])    Adenomatous colon polyp 2000   Surveillance due 2017   Atrial fibrillation ([facility])    Atrial flutter ([facility])    a. diagnosed in 12/2017   Basal cell carcinoma 08/15/2020   left knee- anterior (CX35FU)   Basal cell carcinoma 08/15/2020   left forearm-posterior (CX35FU)   Basal cell carcinoma 01/23/2022   BCC (basal cell carcinoma of skin) 04/27/2019   Top Right Shoulder (curet, cautery and 5FU)   BCC (basal cell carcinoma of skin) 08/15/2020   left preauricular area (Cx35FU)   Chronic diarrhea    Gout    Heart murmur    Helicobacter pylori antibody positive 2015   treatment with Prevpac   History of stomach ulcers    HTN (hypertension)    Hypercholesteremia    Nodular basal cell carcinoma 07/17/2016   Left Shoulder (curet)   Nodular basal cell carcinoma 11/11/2017   Right Post Auricular (treatment after biopsy)   Nodular basal cell carcinoma 09/28/2018   Right Sideburn (treatment after biopsy)   Nodular basal cell carcinoma 09/28/2018   Right Front Neck (treatment after biopsy)   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   in situ- left neck (Cx35FU)   SCC (squamous cell carcinoma) 10/26/2014   Right Ear(in situ) (curet, cautery, and 5FU)   SCC (squamous cell carcinoma) 10/26/2014   Right Upper Lip(in situ) (LN2 60 second freeze)   SCC (squamous cell carcinoma) 07/01/2017   Left Sideburn(in situ) (treatment after biopsy)   SCC (squamous cell carcinoma) 07/01/2017   Right Inner  Cheek(in situ) (treatment after biopsy)   SCC (squamous cell carcinoma) 11/11/2017   Right Inner Cheek Inf.(in situ) (treatment after biopsy)   SCC (squamous cell carcinoma) 09/28/2018   Right Helix Mid(in situ) (treatment after biopsy)   SCC (squamous cell carcinoma) 09/28/2018   Right Inner Cheek,Inf(in situ) (treatment after biopsy)   SCC (squamous cell carcinoma) 04/27/2019   Tip of right nose(in situ) - CX3+5FU   Squamous cell carcinoma of skin 10/26/2014   Below Left Ear(in situ) (curet, cautery, and 5FU)   Superficial basal cell carcinoma 10/26/2014   Top Right Foot (treatment after biopsy)   Superficial nodular basal cell carcinoma 10/26/2014   Right Forearm (treatment after biopsy)    Surgical History: Past Surgical History:  Procedure Laterality Date   BIOPSY  10/24/2016   Procedure: BIOPSY;  Surgeon: Corbin Ade, MD;  [facility];  Service: Endoscopy;;  colon   BIOPSY  01/06/2018   Procedure: BIOPSY;  Surgeon: Malissa Hippo, MD;  [facility];  Service: Endoscopy;;  duodenal   COLONOSCOPY  07/2010   Dr. Jena Gauss: adenomatous polyps, due for surveillance if health permits in 2017   COLONOSCOPY N/A 10/24/2016   Rourk: diverticulosis in entire examined colon, 4mm polyp tubular adenoma, 5mm polyp tubulovillous adenoma, 12mm polyp bening colonic mucosa, internal hemmorhoids   ESOPHAGOGASTRODUODENOSCOPY N/A 05/24/2013   Dr. Rourk:Prepyloric gastric ulcerations-likely source of hematemesis-likely  NSAID-related. H.PYLORI SEROLOGY POSITIVE, TREATED WITH PREVPAC   ESOPHAGOGASTRODUODENOSCOPY N/A 05/23/2013   ZOX:[id number] EGD secondary to much clot and food debris in the stomach   ESOPHAGOGASTRODUODENOSCOPY N/A 08/16/2013   Dr. Jena Gauss: normal esophagus, stomach empty, deformity of antrum. scar present. Previously noted ulcers completed healed. Patent pylorus, normal D1 and D2   ESOPHAGOGASTRODUODENOSCOPY N/A 01/06/2018   Procedure:  ESOPHAGOGASTRODUODENOSCOPY (EGD);  Surgeon: Malissa Hippo, MD;  [facility];  Service: Endoscopy;  Laterality: N/A;   HEMORRHOID SURGERY     PACEMAKER IMPLANT N/A 11/15/2020   Procedure: PACEMAKER IMPLANT;  Surgeon: Marinus Maw, MD;  [facility];  Service: Cardiovascular;  Laterality: N/A;   POLYPECTOMY  10/24/2016   Procedure: POLYPECTOMY;  Surgeon: Corbin Ade, MD;  [facility];  Service: Endoscopy;;  colon   shoulder      Home Medications:  Allergies as of 12/12/2022   No Known Allergies      Medication List        Accurate as of December 12, 2022 12:27 PM. If you have any questions, ask your nurse or doctor.          acetaminophen 500 MG tablet Commonly known as: TYLENOL Take 1,000 mg by mouth at bedtime. May take additional  1000 mg of needed   alfuzosin 10 MG 24 hr tablet Commonly known as: UROXATRAL Take 1 tablet (10 mg total) by mouth at bedtime.   Eliquis 5 MG Tabs tablet Generic drug: apixaban TAKE 1 TABLET BY MOUTH TWICE A DAY   furosemide 20 MG tablet Commonly known as: LASIX TAKE 1 TABLET BY MOUTH EVERY DAY   ipratropium 0.06 % nasal spray Commonly known as: ATROVENT Place 2 sprays into both nostrils daily as needed for rhinitis.   Iron 325 (65 Fe) MG Tabs Take by mouth daily at 6 (six) AM.   LACTOBACILLUS PO Take 1 tablet by mouth daily.   losartan 25 MG tablet Commonly known as: COZAAR TAKE 1 TABLET (25 MG TOTAL) BY MOUTH DAILY.   rOPINIRole 1 MG tablet Commonly known as: REQUIP Take 1 mg by mouth at bedtime.   simvastatin 10 MG tablet Commonly known as: ZOCOR Take 1 tablet (10 mg total) by mouth daily.   SYSTANE OP Apply 1 drop to eye 4 (four) times daily as needed (dry eyes).   verapamil 180 MG CR tablet Commonly known as: CALAN-SR TAKE 1 TABLET BY MOUTH TWICE A DAY   Vitamin D3 50 MCG (2000 UT) Tabs Take 6,000 Units by mouth daily.        Allergies: No Known  Allergies  Family History: Family History  Problem Relation Age of Onset   Stroke Mother    Stroke Father    Pancreatic cancer Sister    Leukemia Brother    Lung cancer Sister    Colon cancer Neg Hx     Social History:  reports that he quit smoking about 22 years ago. His smoking use included cigarettes. He started smoking about 72 years ago. He has a 100 pack-year smoking history. He quit smokeless tobacco use about 23 years ago. He reports that he does not drink alcohol and does not use drugs.  ROS: All other review of systems were reviewed and are negative except what is noted above in HPI  Physical Exam: There were no vitals taken for this visit.  Constitutional:  Alert and oriented, No acute distress. HEENT: [place] AT, moist mucus membranes.  Trachea midline, no  masses. Cardiovascular: No clubbing, cyanosis, or edema. Respiratory: Normal respiratory effort, no increased work of breathing. GI: Abdomen is soft, nontender, nondistended, no abdominal masses GU: No CVA tenderness.  Lymph: No cervical or inguinal lymphadenopathy. Skin: No rashes, bruises or suspicious lesions. Neurologic: Grossly intact, no focal deficits, moving all 4 extremities. Psychiatric: Normal mood and affect.  Laboratory Data: Lab Results  Component Value Date   WBC 9.1 12/09/2021   HGB 13.9 12/09/2021   HCT 40.2 12/09/2021   MCV 102 (H) 12/09/2021   PLT 153 12/09/2021    Lab Results  Component Value Date   CREATININE 1.16 12/09/2021    No results found for: "PSA"  No results found for: "TESTOSTERONE"  No results found for: "HGBA1C"  Urinalysis    Component Value Date/Time   APPEARANCEUR Clear 12/13/2021 1124   GLUCOSEU Negative 12/13/2021 1124   BILIRUBINUR Negative 12/13/2021 1124   PROTEINUR Negative 12/13/2021 1124   UROBILINOGEN negative (A) 09/07/2019 1109   NITRITE Negative 12/13/2021 1124   LEUKOCYTESUR Negative 12/13/2021 1124    Lab Results  Component Value Date    LABMICR See below: 12/13/2021   WBCUA None seen 12/13/2021   LABEPIT None seen 12/13/2021   BACTERIA None seen 12/13/2021    Pertinent Imaging:  No results found for this or any previous visit.  No results found for this or any previous visit.  No results found for this or any previous visit.  No results found for this or any previous visit.  No results found for this or any previous visit.  No valid procedures specified. No results found for this or any previous visit.  No results found for this or any previous visit.   Assessment & Plan:    1. Benign prostatic hyperplasia with urinary obstruction -continue urxatral 10mg  qhs - Urinalysis, Routine w reflex microscopic  2. Nocturia Continue uroxatral 10mg     No follow-ups on file.  Wilkie Aye, MD  [facility] [place]

## 2022-12-12 NOTE — Patient Instructions (Signed)

## 2022-12-15 DIAGNOSIS — E782 Mixed hyperlipidemia: Secondary | ICD-10-CM | POA: Diagnosis not present

## 2022-12-15 DIAGNOSIS — Z23 Encounter for immunization: Secondary | ICD-10-CM | POA: Diagnosis not present

## 2022-12-15 DIAGNOSIS — Z85828 Personal history of other malignant neoplasm of skin: Secondary | ICD-10-CM | POA: Diagnosis not present

## 2022-12-15 DIAGNOSIS — E538 Deficiency of other specified B group vitamins: Secondary | ICD-10-CM | POA: Diagnosis not present

## 2022-12-15 DIAGNOSIS — R4589 Other symptoms and signs involving emotional state: Secondary | ICD-10-CM | POA: Diagnosis not present

## 2022-12-15 DIAGNOSIS — I482 Chronic atrial fibrillation, unspecified: Secondary | ICD-10-CM | POA: Diagnosis not present

## 2022-12-15 DIAGNOSIS — R7301 Impaired fasting glucose: Secondary | ICD-10-CM | POA: Diagnosis not present

## 2022-12-15 DIAGNOSIS — I10 Essential (primary) hypertension: Secondary | ICD-10-CM | POA: Diagnosis not present

## 2022-12-15 DIAGNOSIS — D696 Thrombocytopenia, unspecified: Secondary | ICD-10-CM | POA: Diagnosis not present

## 2022-12-15 DIAGNOSIS — N401 Enlarged prostate with lower urinary tract symptoms: Secondary | ICD-10-CM | POA: Diagnosis not present

## 2022-12-15 DIAGNOSIS — F0390 Unspecified dementia without behavioral disturbance: Secondary | ICD-10-CM | POA: Diagnosis not present

## 2022-12-15 DIAGNOSIS — I700 Atherosclerosis of aorta: Secondary | ICD-10-CM | POA: Diagnosis not present

## 2023-02-11 ENCOUNTER — Encounter: Payer: Medicare HMO | Admitting: Internal Medicine

## 2023-02-12 ENCOUNTER — Ambulatory Visit: Payer: Medicare HMO

## 2023-02-12 DIAGNOSIS — I495 Sick sinus syndrome: Secondary | ICD-10-CM | POA: Diagnosis not present

## 2023-02-12 LAB — CUP PACEART REMOTE DEVICE CHECK
Battery Remaining Longevity: 99 mo
Battery Remaining Percentage: 83 %
Battery Voltage: 3.01 V
Brady Statistic AP VP Percent: 1 %
Brady Statistic AP VS Percent: 5 %
Brady Statistic AS VP Percent: 2 %
Brady Statistic AS VS Percent: 93 %
Brady Statistic RA Percent Paced: 2.4 %
Brady Statistic RV Percent Paced: 7.5 %
Date Time Interrogation Session: 20241219020012
Implantable Lead Connection Status: 753985
Implantable Lead Connection Status: 753985
Implantable Lead Implant Date: 20220922
Implantable Lead Implant Date: 20220922
Implantable Lead Location: 753859
Implantable Lead Location: 753860
Implantable Pulse Generator Implant Date: 20220922
Lead Channel Impedance Value: 460 Ohm
Lead Channel Impedance Value: 530 Ohm
Lead Channel Pacing Threshold Amplitude: 1 V
Lead Channel Pacing Threshold Amplitude: 1.5 V
Lead Channel Pacing Threshold Pulse Width: 0.4 ms
Lead Channel Pacing Threshold Pulse Width: 0.4 ms
Lead Channel Sensing Intrinsic Amplitude: 11.8 mV
Lead Channel Sensing Intrinsic Amplitude: 4.6 mV
Lead Channel Setting Pacing Amplitude: 2 V
Lead Channel Setting Pacing Amplitude: 3 V
Lead Channel Setting Pacing Pulse Width: 0.4 ms
Lead Channel Setting Sensing Sensitivity: 2 mV
Pulse Gen Model: 2272
Pulse Gen Serial Number: 3964503

## 2023-02-20 ENCOUNTER — Ambulatory Visit (INDEPENDENT_AMBULATORY_CARE_PROVIDER_SITE_OTHER): Payer: Medicare HMO | Admitting: Otolaryngology

## 2023-02-20 VITALS — BP 134/63 | HR 63 | Ht 69.0 in | Wt 228.0 lb

## 2023-02-20 DIAGNOSIS — H6123 Impacted cerumen, bilateral: Secondary | ICD-10-CM | POA: Insufficient documentation

## 2023-02-20 NOTE — Progress Notes (Signed)
Patient ID: Juan Quinn, adult   DOB: Nov 03, 1930, 89 y.o.   MRN: [id number]  Procedure: Bilateral cerumen disimpaction.   Indication: Cerumen impaction, resulting in ear discomfort and conductive hearing loss.   Description: The patient is placed supine on the operating table. Under the operating microscope, the right ear canal is examined and is noted to be impacted with cerumen. The cerumen is carefully removed with a combination of suction catheters, cerumen curette, and alligator forceps. After the cerumen removal, the ear canal and tympanic membrane are noted to be normal. No middle ear effusion is noted. The same procedure is then repeated on the left side without exception. The patient tolerated the procedure well.  Follow-up care:  The patient is instructed not to use Q-tips to clean the ear canals. The patient will follow up in 6 months.

## 2023-03-04 ENCOUNTER — Encounter: Payer: Self-pay | Admitting: Internal Medicine

## 2023-03-04 ENCOUNTER — Ambulatory Visit: Payer: Medicare HMO | Attending: Internal Medicine | Admitting: Internal Medicine

## 2023-03-04 VITALS — BP 106/76 | HR 66 | Ht 69.0 in | Wt 225.0 lb

## 2023-03-04 DIAGNOSIS — I4891 Unspecified atrial fibrillation: Secondary | ICD-10-CM

## 2023-03-04 NOTE — Patient Instructions (Signed)

## 2023-03-04 NOTE — Progress Notes (Signed)
 HPI [name] returns today for followup. He is a pleasant 89 yo man with a h/o PAF and symptomatic tachy-brady syndrome who underwent PPM insertion over 2 years ago. The patient denies chest pain and has rare palpitations. He has some dyspnea with exertion. His diarrhea improved with verapamil .  He has had progressive atrial fib.  No Known Allergies   Current Outpatient Medications  Medication Sig Dispense Refill   acetaminophen  (TYLENOL ) 500 MG tablet Take 1,000 mg by mouth at bedtime. May take additional  1000 mg of needed     alfuzosin  (UROXATRAL ) 10 MG 24 hr tablet Take 1 tablet (10 mg total) by mouth at bedtime. 90 tablet 3   Cholecalciferol  (VITAMIN D3) 2000 units TABS Take 6,000 Units by mouth daily.     ELIQUIS  5 MG TABS tablet TAKE 1 TABLET BY MOUTH TWICE A DAY 180 tablet 1   Ferrous Sulfate (IRON) 325 (65 Fe) MG TABS Take by mouth daily at 6 (six) AM.     furosemide  (LASIX ) 20 MG tablet TAKE 1 TABLET BY MOUTH EVERY DAY 90 tablet 2   ipratropium (ATROVENT ) 0.06 % nasal spray Place 2 sprays into both nostrils daily as needed for rhinitis.      LACTOBACILLUS PO Take 1 tablet by mouth daily.     losartan  (COZAAR ) 25 MG tablet TAKE 1 TABLET (25 MG TOTAL) BY MOUTH DAILY. 90 tablet 0   Polyethyl Glycol-Propyl Glycol (SYSTANE OP) Apply 1 drop to eye 4 (four) times daily as needed (dry eyes).     rOPINIRole (REQUIP) 1 MG tablet Take 1 mg by mouth at bedtime.     simvastatin  (ZOCOR ) 10 MG tablet Take 1 tablet (10 mg total) by mouth daily. 90 tablet 3   verapamil  (CALAN -SR) 180 MG CR tablet TAKE 1 TABLET BY MOUTH TWICE A DAY 180 tablet 3   Current Facility-Administered Medications  Medication Dose Route Frequency Provider Last Rate Last Admin   triamcinolone  acetonide (KENALOG -40) injection 40 mg  40 mg Intramuscular Once Livingston Rigg, MD         Past Medical History:  Diagnosis Date   AAA (abdominal aortic aneurysm) (HCC)    Adenomatous colon polyp 2000   Surveillance due  2017   Atrial fibrillation (HCC)    Atrial flutter (HCC)    a. diagnosed in 12/2017   Basal cell carcinoma 08/15/2020   left knee- anterior (CX35FU)   Basal cell carcinoma 08/15/2020   left forearm-posterior (CX35FU)   Basal cell carcinoma 01/23/2022   BCC (basal cell carcinoma of skin) 04/27/2019   Top Right Shoulder (curet, cautery and 5FU)   BCC (basal cell carcinoma of skin) 08/15/2020   left preauricular area (Cx35FU)   Chronic diarrhea    Gout    Heart murmur    Helicobacter pylori antibody positive 2015   treatment with Prevpac   History of stomach ulcers    HTN (hypertension)    Hypercholesteremia    Nodular basal cell carcinoma 07/17/2016   Left Shoulder (curet)   Nodular basal cell carcinoma 11/11/2017   Right Post Auricular (treatment after biopsy)   Nodular basal cell carcinoma 09/28/2018   Right Sideburn (treatment after biopsy)   Nodular basal cell carcinoma 09/28/2018   Right Front Neck (treatment after biopsy)   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   in situ- left neck (Cx35FU)   SCC (squamous cell carcinoma) 10/26/2014   Right Ear(in situ) (curet, cautery, and 5FU)  SCC (squamous cell carcinoma) 10/26/2014   Right Upper Lip(in situ) (LN2 60 second freeze)   SCC (squamous cell carcinoma) 07/01/2017   Left Sideburn(in situ) (treatment after biopsy)   SCC (squamous cell carcinoma) 07/01/2017   Right Inner Cheek(in situ) (treatment after biopsy)   SCC (squamous cell carcinoma) 11/11/2017   Right Inner Cheek Inf.(in situ) (treatment after biopsy)   SCC (squamous cell carcinoma) 09/28/2018   Right Helix Mid(in situ) (treatment after biopsy)   SCC (squamous cell carcinoma) 09/28/2018   Right Inner Cheek,Inf(in situ) (treatment after biopsy)   SCC (squamous cell carcinoma) 04/27/2019   Tip of right nose(in situ) - CX3+5FU   Squamous cell carcinoma of skin 10/26/2014   Below Left Ear(in situ) (curet, cautery, and 5FU)   Superficial  basal cell carcinoma 10/26/2014   Top Right Foot (treatment after biopsy)   Superficial nodular basal cell carcinoma 10/26/2014   Right Forearm (treatment after biopsy)    ROS:   All systems reviewed and negative except as noted in the HPI.   Past Surgical History:  Procedure Laterality Date   BIOPSY  10/24/2016   Procedure: BIOPSY;  Surgeon: Shaaron Lamar HERO, MD;  [facility];  Service: Endoscopy;;  colon   BIOPSY  01/06/2018   Procedure: BIOPSY;  Surgeon: Golda Claudis PENNER, MD;  [facility];  Service: Endoscopy;;  duodenal   COLONOSCOPY  07/2010   Dr. Shaaron: adenomatous polyps, due for surveillance if health permits in 2017   COLONOSCOPY N/A 10/24/2016   Rourk: diverticulosis in entire examined colon, 4mm polyp tubular adenoma, 5mm polyp tubulovillous adenoma, 12mm polyp bening colonic mucosa, internal hemmorhoids   ESOPHAGOGASTRODUODENOSCOPY N/A 05/24/2013   Dr. Rourk:Prepyloric gastric ulcerations-likely source of hematemesis-likely NSAID-related. H.PYLORI SEROLOGY POSITIVE, TREATED WITH PREVPAC   ESOPHAGOGASTRODUODENOSCOPY N/A 05/23/2013   MFM:Pwrnfeozuz EGD secondary to much clot and food debris in the stomach   ESOPHAGOGASTRODUODENOSCOPY N/A 08/16/2013   Dr. Shaaron: normal esophagus, stomach empty, deformity of antrum. scar present. Previously noted ulcers completed healed. Patent pylorus, normal D1 and D2   ESOPHAGOGASTRODUODENOSCOPY N/A 01/06/2018   Procedure: ESOPHAGOGASTRODUODENOSCOPY (EGD);  Surgeon: Golda Claudis PENNER, MD;  [facility];  Service: Endoscopy;  Laterality: N/A;   HEMORRHOID SURGERY     PACEMAKER IMPLANT N/A 11/15/2020   Procedure: PACEMAKER IMPLANT;  Surgeon: Waddell Danelle ORN, MD;  [facility];  Service: Cardiovascular;  Laterality: N/A;   POLYPECTOMY  10/24/2016   Procedure: POLYPECTOMY;  Surgeon: Shaaron Lamar HERO, MD;  [facility];  Service: Endoscopy;;  colon   shoulder       Family History   Problem Relation Age of Onset   Stroke Mother    Stroke Father    Pancreatic cancer Sister    Leukemia Brother    Lung cancer Sister    Colon cancer Neg Hx      Social History   Socioeconomic History   Marital status: Married    Spouse name: Not on file   Number of children: 4   Years of education: Not on file   Highest education level: Not on file  Occupational History   Occupation: retired    Comment: airline pilot  Tobacco Use   Smoking status: Former    Current packs/day: 0.00    Average packs/day: 2.0 packs/day for 50.0 years (100.0 ttl pk-yrs)    Types: Cigarettes    Start date: 07/29/1950    Quit date: 07/28/2000    Years since quitting: 22.6  Smokeless tobacco: Former    Quit date: 02/25/1999  Vaping Use   Vaping status: Never Used  Substance and Sexual Activity   Alcohol use: No    Comment: couple glasses wine/week, occ beer or rmixed drink   Drug use: No   Sexual activity: Not on file  Other Topics Concern   Not on file  Social History Narrative   Lives w/ wife         Social Drivers of Corporate Investment Banker Strain: Not on file  Food Insecurity: Not on file  Transportation Needs: Not on file  Physical Activity: Not on file  Stress: Not on file  Social Connections: Not on file  Intimate Partner Violence: Not on file     Ht 5' 9 (1.753 m)   Wt 225 lb (102.1 kg)   BMI 33.23 kg/m   Physical Exam:  Well appearing NAD HEENT: Unremarkable Neck:  No JVD, no thyromegally Lymphatics:  No adenopathy Back:  No CVA tenderness Lungs:  Clear HEART:  Regular rate rhythm, no murmurs, no rubs, no clicks Abd:  soft, positive bowel sounds, no organomegally, no rebound, no guarding Ext:  2 plus pulses, no edema, no cyanosis, no clubbing Skin:  No rashes no nodules Neuro:  CN II through XII intact, motor grossly intact  EKG  DEVICE  Normal device function.  See PaceArt for details.   Assess/Plan: Symptomatic tachy-brady syndrome - he is improved after  PPM and his HR is better controlled on verapamil .  Coags -he has had no bleeding on eliquis .  Near syncope - he has not had any symptoms since his PPM insertion.  Sinus node dysfunction - he is now mostly in atrial fib and he will undergo watchful waiting.   Danelle Kayann Maj,MD

## 2023-03-05 LAB — CUP PACEART INCLINIC DEVICE CHECK
Brady Statistic RA Percent Paced: 2.3 %
Brady Statistic RV Percent Paced: 7.8 %
Date Time Interrogation Session: 20250108103550
Implantable Lead Connection Status: 753985
Implantable Lead Connection Status: 753985
Implantable Lead Implant Date: 20220922
Implantable Lead Implant Date: 20220922
Implantable Lead Location: 753859
Implantable Lead Location: 753860
Implantable Pulse Generator Implant Date: 20220922
Pulse Gen Model: 2272
Pulse Gen Serial Number: 3964503

## 2023-03-09 DIAGNOSIS — F0393 Unspecified dementia, unspecified severity, with mood disturbance: Secondary | ICD-10-CM | POA: Diagnosis not present

## 2023-03-09 DIAGNOSIS — Z7189 Other specified counseling: Secondary | ICD-10-CM | POA: Diagnosis not present

## 2023-03-09 DIAGNOSIS — F0390 Unspecified dementia without behavioral disturbance: Secondary | ICD-10-CM | POA: Diagnosis not present

## 2023-03-09 DIAGNOSIS — Z539 Procedure and treatment not carried out, unspecified reason: Secondary | ICD-10-CM | POA: Diagnosis not present

## 2023-03-09 DIAGNOSIS — F39 Unspecified mood [affective] disorder: Secondary | ICD-10-CM | POA: Diagnosis not present

## 2023-03-09 DIAGNOSIS — Z7182 Exercise counseling: Secondary | ICD-10-CM | POA: Diagnosis not present

## 2023-03-18 NOTE — Progress Notes (Signed)
Remote pacemaker transmission.   

## 2023-03-27 ENCOUNTER — Other Ambulatory Visit: Payer: Self-pay | Admitting: Internal Medicine

## 2023-04-01 ENCOUNTER — Other Ambulatory Visit: Payer: Self-pay

## 2023-04-01 ENCOUNTER — Emergency Department (HOSPITAL_COMMUNITY)
Admission: EM | Admit: 2023-04-01 | Discharge: 2023-04-01 | Discharge status: Against Medical Advice | Payer: Medicare HMO | Attending: Emergency Medicine | Admitting: Emergency Medicine

## 2023-04-01 ENCOUNTER — Encounter (HOSPITAL_COMMUNITY): Payer: Self-pay | Admitting: *Deleted

## 2023-04-01 ENCOUNTER — Emergency Department (HOSPITAL_COMMUNITY): Payer: Medicare HMO

## 2023-04-01 DIAGNOSIS — R519 Headache, unspecified: Secondary | ICD-10-CM | POA: Diagnosis not present

## 2023-04-01 DIAGNOSIS — S0081XA Abrasion of other part of head, initial encounter: Secondary | ICD-10-CM | POA: Diagnosis not present

## 2023-04-01 DIAGNOSIS — Z5321 Procedure and treatment not carried out due to patient leaving prior to being seen by health care provider: Secondary | ICD-10-CM | POA: Diagnosis not present

## 2023-04-01 DIAGNOSIS — Y92003 Bedroom of unspecified non-institutional (private) residence as the place of occurrence of the external cause: Secondary | ICD-10-CM | POA: Insufficient documentation

## 2023-04-01 DIAGNOSIS — S0993XA Unspecified injury of face, initial encounter: Secondary | ICD-10-CM | POA: Diagnosis not present

## 2023-04-01 DIAGNOSIS — S199XXA Unspecified injury of neck, initial encounter: Secondary | ICD-10-CM | POA: Diagnosis not present

## 2023-04-01 DIAGNOSIS — R9082 White matter disease, unspecified: Secondary | ICD-10-CM | POA: Diagnosis not present

## 2023-04-01 DIAGNOSIS — W06XXXA Fall from bed, initial encounter: Secondary | ICD-10-CM | POA: Insufficient documentation

## 2023-04-01 NOTE — ED Triage Notes (Signed)
Pt fell out of bed early this morning.  Pt is on blood thinner but has not taken this morning.  Pt denies LOC, wife denies any change in his behavior. Pt with abrasion noted to left forehead.

## 2023-05-03 NOTE — Progress Notes (Unsigned)
 [facility]  PATIENT: Juan Quinn DOB: [birth year]  REFERRING DOCTOR OR PCP: Lupita Raider, NP SOURCE: Patient, notes from PCP, imaging and lab reports CT scan images personally reviewed.  _________________________________   HISTORICAL  CHIEF COMPLAINT:  No chief complaint on file.   HISTORY OF PRESENT ILLNESS:  I had the pleasure seeing patient, Juan Quinn, at [facility] for neurologic consultation regarding his cognitive decline.  He presented to the emergency room on 04/01/2023 due to a fall.  CT scan of the head showed advanced generalized cortical atrophy and advanced chronic microvascular ischemic changes.  Degenerative changes were noted in the cervical spine.  There were no acute findings in the brain or spine.  The patient left without being seen.   Imaging: CT scan of the head 04/01/2023 showed advanced generalized cortical atrophy.  The ventricles were enlarged in proportion to the atrophy.  Advanced chronic microvascular ischemic changes are also noted in the cerebral hemispheres.  There were no acute findings.  CT scan of the cervical spine 04/01/2023 showed mild multilevel degenerative changes but there did not appear to be any acute findings.  No spinal stenosis is noted.  REVIEW OF SYSTEMS: Constitutional: No fevers, chills, sweats, or change in appetite Eyes: No visual changes, double vision, eye pain Ear, nose and throat: No hearing loss, ear pain, nasal congestion, sore throat Cardiovascular: No chest pain, palpitations Respiratory:  No shortness of breath at rest or with exertion.   No wheezes GastrointestinaI: No nausea, vomiting, diarrhea, abdominal pain, fecal incontinence Genitourinary:  No dysuria, urinary retention or frequency.  No nocturia. Musculoskeletal:  No neck pain, back pain Integumentary: No rash, pruritus, skin lesions Neurological: as above Psychiatric: No depression at this time.  No anxiety Endocrine: No  palpitations, diaphoresis, change in appetite, change in weigh or increased thirst Hematologic/Lymphatic:  No anemia, purpura, petechiae. Allergic/Immunologic: No itchy/runny eyes, nasal congestion, recent allergic reactions, rashes  ALLERGIES: No Known Allergies  HOME MEDICATIONS:  Current Outpatient Medications:    acetaminophen (TYLENOL) 500 MG tablet, Take 1,000 mg by mouth at bedtime. May take additional  1000 mg of needed, Disp: , Rfl:    alfuzosin (UROXATRAL) 10 MG 24 hr tablet, Take 1 tablet (10 mg total) by mouth at bedtime., Disp: 90 tablet, Rfl: 3   Cholecalciferol (VITAMIN D3) 2000 units TABS, Take 6,000 Units by mouth daily., Disp: , Rfl:    ELIQUIS 5 MG TABS tablet, TAKE 1 TABLET BY MOUTH TWICE A DAY, Disp: 180 tablet, Rfl: 1   Ferrous Sulfate (IRON) 325 (65 Fe) MG TABS, Take by mouth daily at 6 (six) AM., Disp: , Rfl:    furosemide (LASIX) 20 MG tablet, TAKE 1 TABLET BY MOUTH EVERY DAY, Disp: 90 tablet, Rfl: 2   ipratropium (ATROVENT) 0.06 % nasal spray, Place 2 sprays into both nostrils daily as needed for rhinitis. , Disp: , Rfl:    LACTOBACILLUS PO, Take 1 tablet by mouth daily., Disp: , Rfl:    losartan (COZAAR) 25 MG tablet, TAKE 1 TABLET (25 MG TOTAL) BY MOUTH DAILY., Disp: 90 tablet, Rfl: 3   Polyethyl Glycol-Propyl Glycol (SYSTANE OP), Apply 1 drop to eye 4 (four) times daily as needed (dry eyes)., Disp: , Rfl:    rOPINIRole (REQUIP) 1 MG tablet, Take 1 mg by mouth at bedtime., Disp: , Rfl:    simvastatin (ZOCOR) 10 MG tablet, Take 1 tablet (10 mg total) by mouth daily., Disp: 90 tablet, Rfl: 3   verapamil (CALAN-SR) 180  MG CR tablet, TAKE 1 TABLET BY MOUTH TWICE A DAY, Disp: 180 tablet, Rfl: 3  Current Facility-Administered Medications:    triamcinolone acetonide (KENALOG-40) injection 40 mg, 40 mg, Intramuscular, Once, Janalyn Harder, MD  PAST MEDICAL HISTORY: Past Medical History:  Diagnosis Date   AAA (abdominal aortic aneurysm) (HCC)    Adenomatous colon  polyp 2000   Surveillance due 2017   Atrial fibrillation (HCC)    Atrial flutter (HCC)    a. diagnosed in 12/2017   Basal cell carcinoma 08/15/2020   left knee- anterior (CX35FU)   Basal cell carcinoma 08/15/2020   left forearm-posterior (CX35FU)   Basal cell carcinoma 01/23/2022   BCC (basal cell carcinoma of skin) 04/27/2019   Top Right Shoulder (curet, cautery and 5FU)   BCC (basal cell carcinoma of skin) 08/15/2020   left preauricular area (Cx35FU)   Chronic diarrhea    Gout    Heart murmur    Helicobacter pylori antibody positive 2015   treatment with Prevpac   History of stomach ulcers    HTN (hypertension)    Hypercholesteremia    Nodular basal cell carcinoma 07/17/2016   Left Shoulder (curet)   Nodular basal cell carcinoma 11/11/2017   Right Post Auricular (treatment after biopsy)   Nodular basal cell carcinoma 09/28/2018   Right Sideburn (treatment after biopsy)   Nodular basal cell carcinoma 09/28/2018   Right Front Neck (treatment after biopsy)   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   in situ- left neck (Cx35FU)   SCC (squamous cell carcinoma) 10/26/2014   Right Ear(in situ) (curet, cautery, and 5FU)   SCC (squamous cell carcinoma) 10/26/2014   Right Upper Lip(in situ) (LN2 60 second freeze)   SCC (squamous cell carcinoma) 07/01/2017   Left Sideburn(in situ) (treatment after biopsy)   SCC (squamous cell carcinoma) 07/01/2017   Right Inner Cheek(in situ) (treatment after biopsy)   SCC (squamous cell carcinoma) 11/11/2017   Right Inner Cheek Inf.(in situ) (treatment after biopsy)   SCC (squamous cell carcinoma) 09/28/2018   Right Helix Mid(in situ) (treatment after biopsy)   SCC (squamous cell carcinoma) 09/28/2018   Right Inner Cheek,Inf(in situ) (treatment after biopsy)   SCC (squamous cell carcinoma) 04/27/2019   Tip of right nose(in situ) - CX3+5FU   Squamous cell carcinoma of skin 10/26/2014   Below Left Ear(in situ) (curet,  cautery, and 5FU)   Superficial basal cell carcinoma 10/26/2014   Top Right Foot (treatment after biopsy)   Superficial nodular basal cell carcinoma 10/26/2014   Right Forearm (treatment after biopsy)    PAST SURGICAL HISTORY: Past Surgical History:  Procedure Laterality Date   BIOPSY  10/24/2016   Procedure: BIOPSY;  Surgeon: Corbin Ade, MD;  [facility];  Service: Endoscopy;;  colon   BIOPSY  01/06/2018   Procedure: BIOPSY;  Surgeon: Malissa Hippo, MD;  [facility];  Service: Endoscopy;;  duodenal   COLONOSCOPY  07/2010   Dr. Jena Gauss: adenomatous polyps, due for surveillance if health permits in 2017   COLONOSCOPY N/A 10/24/2016   Rourk: diverticulosis in entire examined colon, 4mm polyp tubular adenoma, 5mm polyp tubulovillous adenoma, 12mm polyp bening colonic mucosa, internal hemmorhoids   ESOPHAGOGASTRODUODENOSCOPY N/A 05/24/2013   Dr. Rourk:Prepyloric gastric ulcerations-likely source of hematemesis-likely NSAID-related. H.PYLORI SEROLOGY POSITIVE, TREATED WITH PREVPAC   ESOPHAGOGASTRODUODENOSCOPY N/A 05/23/2013   NWG:NFAOZHYQMV EGD secondary to much clot and food debris in the stomach   ESOPHAGOGASTRODUODENOSCOPY N/A 08/16/2013   Dr. Jena Gauss: normal  esophagus, stomach empty, deformity of antrum. scar present. Previously noted ulcers completed healed. Patent pylorus, normal D1 and D2   ESOPHAGOGASTRODUODENOSCOPY N/A 01/06/2018   Procedure: ESOPHAGOGASTRODUODENOSCOPY (EGD);  Surgeon: Malissa Hippo, MD;  [facility];  Service: Endoscopy;  Laterality: N/A;   HEMORRHOID SURGERY     PACEMAKER IMPLANT N/A 11/15/2020   Procedure: PACEMAKER IMPLANT;  Surgeon: Marinus Maw, MD;  [facility];  Service: Cardiovascular;  Laterality: N/A;   POLYPECTOMY  10/24/2016   Procedure: POLYPECTOMY;  Surgeon: Corbin Ade, MD;  [facility];  Service: Endoscopy;;  colon   shoulder      FAMILY HISTORY: Family History   Problem Relation Age of Onset   Stroke Mother    Stroke Father    Pancreatic cancer Sister    Leukemia Brother    Lung cancer Sister    Colon cancer Neg Hx     SOCIAL HISTORY: Social History   Socioeconomic History   Marital status: Married    Spouse name: Not on file   Number of children: 4   Years of education: Not on file   Highest education level: Not on file  Occupational History   Occupation: retired    Comment: Airline pilot  Tobacco Use   Smoking status: Former    Current packs/day: 0.00    Average packs/day: 2.0 packs/day for 50.0 years (100.0 ttl pk-yrs)    Types: Cigarettes    Start date: 07/29/1950    Quit date: 07/28/2000    Years since quitting: 22.7   Smokeless tobacco: Former    Quit date: 02/25/1999  Vaping Use   Vaping status: Never Used  Substance and Sexual Activity   Alcohol use: No    Comment: couple glasses wine/week, occ beer or rmixed drink   Drug use: No   Sexual activity: Not on file  Other Topics Concern   Not on file  Social History Narrative   Lives w/ wife         Social Drivers of Corporate investment banker Strain: Not on file  Food Insecurity: Not on file  Transportation Needs: Not on file  Physical Activity: Not on file  Stress: Not on file  Social Connections: Not on file  Intimate Partner Violence: Not on file       PHYSICAL EXAM  There were no vitals filed for this visit.  There is no height or weight on file to calculate BMI.   General: The patient is well-developed and well-nourished and in no acute distress  HEENT:  Head is [place]/AT.  Sclera are anicteric.  Funduscopic exam shows normal optic discs and retinal vessels.  Neck: No carotid bruits are noted.  The neck is nontender.  Cardiovascular: The heart has a regular rate and rhythm with a normal S1 and S2. There were no murmurs, gallops or rubs.    Skin: Extremities are without rash or  edema.  Musculoskeletal:  Back is nontender  Neurologic Exam  Mental  status: The patient is alert and oriented x 3 at the time of the examination. The patient has apparent normal recent and remote memory, with an apparently normal attention span and concentration ability.   Speech is normal.  Cranial nerves: Extraocular movements are full. Pupils are equal, round, and reactive to light and accomodation.  Visual fields are full.  Facial symmetry is present. There is good facial sensation to soft touch bilaterally.Facial strength is normal.  Trapezius and sternocleidomastoid strength is  normal. No dysarthria is noted.  The tongue is midline, and the patient has symmetric elevation of the soft palate. No obvious hearing deficits are noted.  Motor:  Muscle bulk is normal.   Tone is normal. Strength is  5 / 5 in all 4 extremities.   Sensory: Sensory testing is intact to pinprick, soft touch and vibration sensation in all 4 extremities.  Coordination: Cerebellar testing reveals good finger-nose-finger and heel-to-shin bilaterally.  Gait and station: Station is normal.   Gait is normal. Tandem gait is normal. Romberg is negative.   Reflexes: Deep tendon reflexes are symmetric and normal bilaterally.   Plantar responses are flexor.    DIAGNOSTIC DATA (LABS, IMAGING, TESTING) - I reviewed patient records, labs, notes, testing and imaging myself where available.  Lab Results  Component Value Date   WBC 9.1 12/09/2021   HGB 13.9 12/09/2021   HCT 40.2 12/09/2021   MCV 102 (H) 12/09/2021   PLT 153 12/09/2021      Component Value Date/Time   NA 142 12/09/2021 1552   K 4.3 12/09/2021 1552   CL 106 12/09/2021 1552   CO2 22 12/09/2021 1552   GLUCOSE 104 (H) 12/09/2021 1552   GLUCOSE 100 (H) 11/08/2020 1049   BUN 18 12/09/2021 1552   CREATININE 1.16 12/09/2021 1552   CREATININE 1.09 12/15/2019 1536   CALCIUM 9.3 12/09/2021 1552   PROT 6.1 10/21/2018 0948   ALBUMIN 4.0 01/05/2018 1001   AST 14 10/21/2018 0948   ALT 10 10/21/2018 0948   ALKPHOS 63 01/05/2018  1001   BILITOT 0.6 10/21/2018 0948   GFRNONAA >60 11/08/2020 1049   GFRNONAA 70 12/25/2016 1258   GFRAA >60 07/18/2018 1837   GFRAA 81 12/25/2016 1258   Lab Results  Component Value Date   CHOL 192 10/21/2018   HDL 55 10/21/2018   LDLCALC 111 (H) 10/21/2018   TRIG 148 10/21/2018   CHOLHDL 3.5 10/21/2018   No results found for: "HGBA1C" Lab Results  Component Value Date   VITAMINB12 837 07/02/2018   Lab Results  Component Value Date   TSH 1.97 10/21/2018       ASSESSMENT AND PLAN  ***   Farris Blash A. Epimenio Foot, MD, [facility] 05/03/2023, 2:31 PM Certified in Neurology, Clinical Neurophysiology, Sleep Medicine and Neuroimaging  [facility] [address] [place], [place] [address] [phone]

## 2023-05-04 ENCOUNTER — Ambulatory Visit: Admitting: Neurology

## 2023-05-04 ENCOUNTER — Encounter: Payer: Self-pay | Admitting: Neurology

## 2023-05-04 VITALS — BP 100/71 | HR 69 | Ht 69.0 in | Wt 234.0 lb

## 2023-05-04 DIAGNOSIS — F03B18 Unspecified dementia, moderate, with other behavioral disturbance: Secondary | ICD-10-CM | POA: Diagnosis not present

## 2023-05-04 DIAGNOSIS — I4891 Unspecified atrial fibrillation: Secondary | ICD-10-CM

## 2023-05-04 DIAGNOSIS — R0683 Snoring: Secondary | ICD-10-CM | POA: Diagnosis not present

## 2023-05-04 DIAGNOSIS — G4719 Other hypersomnia: Secondary | ICD-10-CM | POA: Diagnosis not present

## 2023-05-04 DIAGNOSIS — I10 Essential (primary) hypertension: Secondary | ICD-10-CM

## 2023-05-04 DIAGNOSIS — R413 Other amnesia: Secondary | ICD-10-CM

## 2023-05-04 MED ORDER — MODAFINIL 100 MG PO TABS: 100.0000 mg | ORAL_TABLET | Freq: Every day | ORAL | 5 refills | Status: AC

## 2023-05-07 ENCOUNTER — Encounter: Payer: Self-pay | Admitting: Neurology

## 2023-05-07 LAB — ATN PROFILE
A -- Beta-amyloid 42/40 Ratio: 0.116 (ref >0.102)
Beta-amyloid 40: 252.39 pg/mL
Beta-amyloid 42: 29.39 pg/mL
N -- NfL, Plasma: 5.32 pg/mL (ref 0.00–11.55)
T -- p-tau181: 1.73 pg/mL — ABNORMAL HIGH (ref 0.00–0.97)

## 2023-05-07 LAB — THYROID PANEL WITH TSH
Free Thyroxine Index: 1.8 (ref 1.2–4.9)
T3 Uptake Ratio: 26 % (ref 24–39)
T4, Total: 7.1 ug/dL (ref 4.5–12.0)
TSH: 3.72 u[IU]/mL (ref 0.450–4.500)

## 2023-05-07 LAB — VITAMIN B12: Vitamin B-12: 342 pg/mL (ref 232–1245)

## 2023-05-14 ENCOUNTER — Other Ambulatory Visit: Payer: Self-pay | Admitting: Neurology

## 2023-05-14 ENCOUNTER — Ambulatory Visit: Payer: Medicare HMO

## 2023-05-14 DIAGNOSIS — I495 Sick sinus syndrome: Secondary | ICD-10-CM | POA: Diagnosis not present

## 2023-05-14 MED ORDER — DONEPEZIL HCL 10 MG PO TABS: 10.0000 mg | ORAL_TABLET | Freq: Every day | ORAL | 3 refills | Status: AC

## 2023-05-15 LAB — CUP PACEART REMOTE DEVICE CHECK
Battery Remaining Longevity: 95 mo
Battery Remaining Percentage: 81 %
Battery Voltage: 3.01 V
Brady Statistic AP VP Percent: 1 %
Brady Statistic AP VS Percent: 2.1 %
Brady Statistic AS VP Percent: 4.4 %
Brady Statistic AS VS Percent: 93 %
Brady Statistic RA Percent Paced: 1 %
Brady Statistic RV Percent Paced: 14 %
Date Time Interrogation Session: 20250320020026
Implantable Lead Connection Status: 753985
Implantable Lead Connection Status: 753985
Implantable Lead Implant Date: 20220922
Implantable Lead Implant Date: 20220922
Implantable Lead Location: 753859
Implantable Lead Location: 753860
Implantable Pulse Generator Implant Date: 20220922
Lead Channel Impedance Value: 460 Ohm
Lead Channel Impedance Value: 480 Ohm
Lead Channel Pacing Threshold Amplitude: 1 V
Lead Channel Pacing Threshold Amplitude: 1.25 V
Lead Channel Pacing Threshold Pulse Width: 0.4 ms
Lead Channel Pacing Threshold Pulse Width: 0.4 ms
Lead Channel Sensing Intrinsic Amplitude: 10.4 mV
Lead Channel Sensing Intrinsic Amplitude: 3.2 mV
Lead Channel Setting Pacing Amplitude: 2 V
Lead Channel Setting Pacing Amplitude: 3 V
Lead Channel Setting Pacing Pulse Width: 0.4 ms
Lead Channel Setting Sensing Sensitivity: 2 mV
Pulse Gen Model: 2272
Pulse Gen Serial Number: 3964503

## 2023-05-18 ENCOUNTER — Other Ambulatory Visit (HOSPITAL_COMMUNITY): Payer: Self-pay

## 2023-05-18 ENCOUNTER — Telehealth: Payer: Self-pay

## 2023-05-18 ENCOUNTER — Encounter: Payer: Self-pay | Admitting: Internal Medicine

## 2023-05-18 NOTE — Telephone Encounter (Signed)
 Pharmacy Patient Advocate Encounter   Received notification from CoverMyMeds that prior authorization for Modafinil 100MG  Tablet is required/requested.   Insurance verification completed.   The patient is insured through [facility] .   Per test claim: PA required; PA submitted to above mentioned insurance via CoverMyMeds Key/confirmation #/EOC [id number] Status is pending

## 2023-05-20 NOTE — Telephone Encounter (Addendum)
 Called niece, Lawson Fiscal at [phone] and LVM for her to call office. I was going to discuss them using good rx coupon to fill modafinil to help with cost since PA denied

## 2023-05-20 NOTE — Telephone Encounter (Signed)
 Pharmacy Patient Advocate Encounter  Received notification from [facility] that Prior Authorization for Modafinil 100MG  tablets has been DENIED.  Full denial letter will be uploaded to the media tab. See denial reason below.   PA #/Case ID/Reference #: PA Case ID #: [id number]

## 2023-05-20 NOTE — Telephone Encounter (Signed)
 Pt's niece is asking for a call back from [place], [place]

## 2023-05-21 NOTE — Telephone Encounter (Signed)
 LVM for niece to call office

## 2023-05-25 NOTE — Telephone Encounter (Signed)
 LVM for niece, Lawson Fiscal at [phone] to call office.

## 2023-05-25 NOTE — Telephone Encounter (Signed)
 Took call from Lisa/phone room and spoke w/ Lawson Fiscal. Relayed below info. She will help them use goodrx coupon. She is familiar with this process. If they have difficulty picking up med, she will let us know. Aware rx called in to see if it will help sleepiness. Should take in the morning.

## 2023-06-02 ENCOUNTER — Ambulatory Visit: Payer: Medicare HMO | Admitting: Neurology

## 2023-06-15 DIAGNOSIS — F39 Unspecified mood [affective] disorder: Secondary | ICD-10-CM | POA: Diagnosis not present

## 2023-06-15 DIAGNOSIS — I482 Chronic atrial fibrillation, unspecified: Secondary | ICD-10-CM | POA: Diagnosis not present

## 2023-06-15 DIAGNOSIS — E538 Deficiency of other specified B group vitamins: Secondary | ICD-10-CM | POA: Diagnosis not present

## 2023-06-15 DIAGNOSIS — I700 Atherosclerosis of aorta: Secondary | ICD-10-CM | POA: Diagnosis not present

## 2023-06-15 DIAGNOSIS — Z85828 Personal history of other malignant neoplasm of skin: Secondary | ICD-10-CM | POA: Diagnosis not present

## 2023-06-15 DIAGNOSIS — D696 Thrombocytopenia, unspecified: Secondary | ICD-10-CM | POA: Diagnosis not present

## 2023-06-15 DIAGNOSIS — F0390 Unspecified dementia without behavioral disturbance: Secondary | ICD-10-CM | POA: Diagnosis not present

## 2023-06-15 DIAGNOSIS — R7301 Impaired fasting glucose: Secondary | ICD-10-CM | POA: Diagnosis not present

## 2023-06-15 DIAGNOSIS — F0393 Unspecified dementia, unspecified severity, with mood disturbance: Secondary | ICD-10-CM | POA: Diagnosis not present

## 2023-06-15 DIAGNOSIS — E782 Mixed hyperlipidemia: Secondary | ICD-10-CM | POA: Diagnosis not present

## 2023-06-15 DIAGNOSIS — I10 Essential (primary) hypertension: Secondary | ICD-10-CM | POA: Diagnosis not present

## 2023-06-15 DIAGNOSIS — N401 Enlarged prostate with lower urinary tract symptoms: Secondary | ICD-10-CM | POA: Diagnosis not present

## 2023-06-16 ENCOUNTER — Ambulatory Visit: Payer: Medicare HMO | Admitting: Neurology

## 2023-06-17 ENCOUNTER — Ambulatory Visit: Payer: Medicare HMO | Admitting: Neurology

## 2023-06-22 DIAGNOSIS — I739 Peripheral vascular disease, unspecified: Secondary | ICD-10-CM | POA: Diagnosis not present

## 2023-06-22 DIAGNOSIS — B351 Tinea unguium: Secondary | ICD-10-CM | POA: Diagnosis not present

## 2023-06-24 NOTE — Addendum Note (Signed)
 Addended by: Lott Rouleau A on: 06/24/2023 11:18 AM   Modules accepted: Orders

## 2023-06-24 NOTE — Progress Notes (Signed)
 Remote pacemaker transmission.

## 2023-07-15 DIAGNOSIS — C44329 Squamous cell carcinoma of skin of other parts of face: Secondary | ICD-10-CM | POA: Diagnosis not present

## 2023-07-15 DIAGNOSIS — D485 Neoplasm of uncertain behavior of skin: Secondary | ICD-10-CM | POA: Diagnosis not present

## 2023-07-15 DIAGNOSIS — C44719 Basal cell carcinoma of skin of left lower limb, including hip: Secondary | ICD-10-CM | POA: Diagnosis not present

## 2023-08-13 ENCOUNTER — Ambulatory Visit (INDEPENDENT_AMBULATORY_CARE_PROVIDER_SITE_OTHER): Payer: Medicare HMO

## 2023-08-13 DIAGNOSIS — I495 Sick sinus syndrome: Secondary | ICD-10-CM | POA: Diagnosis not present

## 2023-08-13 LAB — CUP PACEART REMOTE DEVICE CHECK
Battery Remaining Longevity: 91 mo
Battery Remaining Percentage: 78 %
Battery Voltage: 3.01 V
Brady Statistic AP VP Percent: 1 %
Brady Statistic AP VS Percent: 1.7 %
Brady Statistic AS VP Percent: 8.6 %
Brady Statistic AS VS Percent: 89 %
Brady Statistic RA Percent Paced: 1 %
Brady Statistic RV Percent Paced: 14 %
Date Time Interrogation Session: 20250619025540
Implantable Lead Connection Status: 753985
Implantable Lead Connection Status: 753985
Implantable Lead Implant Date: 20220922
Implantable Lead Implant Date: 20220922
Implantable Lead Location: 753859
Implantable Lead Location: 753860
Implantable Pulse Generator Implant Date: 20220922
Lead Channel Impedance Value: 440 Ohm
Lead Channel Impedance Value: 460 Ohm
Lead Channel Pacing Threshold Amplitude: 1 V
Lead Channel Pacing Threshold Amplitude: 1.25 V
Lead Channel Pacing Threshold Pulse Width: 0.4 ms
Lead Channel Pacing Threshold Pulse Width: 0.4 ms
Lead Channel Sensing Intrinsic Amplitude: 3.2 mV
Lead Channel Sensing Intrinsic Amplitude: 9.4 mV
Lead Channel Setting Pacing Amplitude: 2 V
Lead Channel Setting Pacing Amplitude: 3 V
Lead Channel Setting Pacing Pulse Width: 0.4 ms
Lead Channel Setting Sensing Sensitivity: 2 mV
Pulse Gen Model: 2272
Pulse Gen Serial Number: 3964503

## 2023-08-16 ENCOUNTER — Ambulatory Visit: Payer: Self-pay | Admitting: Internal Medicine

## 2023-08-24 ENCOUNTER — Ambulatory Visit (INDEPENDENT_AMBULATORY_CARE_PROVIDER_SITE_OTHER): Payer: Medicare HMO | Admitting: Otolaryngology

## 2023-09-04 ENCOUNTER — Other Ambulatory Visit (HOSPITAL_COMMUNITY): Payer: Self-pay | Admitting: Family Medicine

## 2023-09-04 ENCOUNTER — Ambulatory Visit (HOSPITAL_COMMUNITY)
Admission: RE | Admit: 2023-09-04 | Discharge: 2023-09-04 | Disposition: A | Discharge status: Home / Self Care | Source: Ambulatory Visit | Attending: Family Medicine | Admitting: Family Medicine

## 2023-09-04 DIAGNOSIS — F0390 Unspecified dementia without behavioral disturbance: Secondary | ICD-10-CM | POA: Diagnosis not present

## 2023-09-04 DIAGNOSIS — F0393 Unspecified dementia, unspecified severity, with mood disturbance: Secondary | ICD-10-CM | POA: Diagnosis not present

## 2023-09-04 DIAGNOSIS — F39 Unspecified mood [affective] disorder: Secondary | ICD-10-CM | POA: Diagnosis not present

## 2023-09-04 DIAGNOSIS — M79642 Pain in left hand: Secondary | ICD-10-CM

## 2023-09-04 DIAGNOSIS — M7989 Other specified soft tissue disorders: Secondary | ICD-10-CM | POA: Diagnosis not present

## 2023-09-04 DIAGNOSIS — W19XXXA Unspecified fall, initial encounter: Secondary | ICD-10-CM | POA: Diagnosis not present

## 2023-09-04 DIAGNOSIS — S60222A Contusion of left hand, initial encounter: Secondary | ICD-10-CM | POA: Diagnosis not present

## 2023-09-04 DIAGNOSIS — S80212A Abrasion, left knee, initial encounter: Secondary | ICD-10-CM | POA: Diagnosis not present

## 2023-09-04 DIAGNOSIS — Z9181 History of falling: Secondary | ICD-10-CM | POA: Diagnosis not present

## 2023-09-18 ENCOUNTER — Other Ambulatory Visit: Payer: Self-pay | Admitting: Internal Medicine

## 2023-10-01 ENCOUNTER — Ambulatory Visit (INDEPENDENT_AMBULATORY_CARE_PROVIDER_SITE_OTHER): Admitting: Otolaryngology

## 2023-10-15 NOTE — Progress Notes (Signed)
 Remote pacemaker transmission.

## 2023-11-02 DIAGNOSIS — C44719 Basal cell carcinoma of skin of left lower limb, including hip: Secondary | ICD-10-CM | POA: Diagnosis not present

## 2023-11-02 DIAGNOSIS — C44329 Squamous cell carcinoma of skin of other parts of face: Secondary | ICD-10-CM | POA: Diagnosis not present

## 2023-11-12 ENCOUNTER — Ambulatory Visit (INDEPENDENT_AMBULATORY_CARE_PROVIDER_SITE_OTHER)

## 2023-11-12 DIAGNOSIS — I495 Sick sinus syndrome: Secondary | ICD-10-CM

## 2023-11-12 LAB — CUP PACEART REMOTE DEVICE CHECK
Battery Remaining Longevity: 90 mo
Battery Remaining Percentage: 76 %
Battery Voltage: 2.99 V
Brady Statistic AP VP Percent: 1 %
Brady Statistic AP VS Percent: 1.6 %
Brady Statistic AS VP Percent: 9.1 %
Brady Statistic AS VS Percent: 89 %
Brady Statistic RA Percent Paced: 1 %
Brady Statistic RV Percent Paced: 14 %
Date Time Interrogation Session: 20250918033535
Implantable Lead Connection Status: 753985
Implantable Lead Connection Status: 753985
Implantable Lead Implant Date: 20220922
Implantable Lead Implant Date: 20220922
Implantable Lead Location: 753859
Implantable Lead Location: 753860
Implantable Pulse Generator Implant Date: 20220922
Lead Channel Impedance Value: 460 Ohm
Lead Channel Impedance Value: 510 Ohm
Lead Channel Pacing Threshold Amplitude: 1 V
Lead Channel Pacing Threshold Amplitude: 1.25 V
Lead Channel Pacing Threshold Pulse Width: 0.4 ms
Lead Channel Pacing Threshold Pulse Width: 0.4 ms
Lead Channel Sensing Intrinsic Amplitude: 10.2 mV
Lead Channel Sensing Intrinsic Amplitude: 3.6 mV
Lead Channel Setting Pacing Amplitude: 2 V
Lead Channel Setting Pacing Amplitude: 3 V
Lead Channel Setting Pacing Pulse Width: 0.4 ms
Lead Channel Setting Sensing Sensitivity: 2 mV
Pulse Gen Model: 2272
Pulse Gen Serial Number: 3964503

## 2023-11-17 NOTE — Progress Notes (Signed)
 Remote PPM Transmission

## 2023-11-21 ENCOUNTER — Ambulatory Visit: Payer: Self-pay | Admitting: Internal Medicine

## 2023-11-26 ENCOUNTER — Telehealth: Payer: Self-pay | Admitting: Internal Medicine

## 2023-11-26 NOTE — Telephone Encounter (Signed)
 Wife calling in about patient medication. Please advise

## 2023-11-26 NOTE — Telephone Encounter (Signed)
 Spoke with the patient's wife who states that the patient has dementia and she is sometimes unable to give him his Eliquis  twice per day. She states that sometimes he will not wake up enough to take his medications until 4pm in the afternoon so she will give him his first dose at that time but then she will not give him another dose that day because he is asleep again when it would be due. She states that if he gets up early enough in the morning around 10am she will give him his first dose of eliquis  and then she is usually able to give him his second dose later that night. She wanted to make Dr. Waddell aware. Advised that we could see about switching him to a once per day blood thinner. Wife states that might work better although she is hesitant to switch his eliquis  since he has been on it for so long.

## 2023-11-30 NOTE — Telephone Encounter (Signed)
 Would stop the eliquis  and start xarelto 15 ,mg daily, and give at night after eating supper with food on the stomach. GT

## 2023-12-02 MED ORDER — RIVAROXABAN 15 MG PO TABS: 15.0000 mg | ORAL_TABLET | Freq: Every day | ORAL | 3 refills | Status: DC

## 2023-12-02 NOTE — Telephone Encounter (Signed)
 Pt's spouse is calling to f/u and would like a c/b. Please Advise

## 2023-12-02 NOTE — Telephone Encounter (Signed)
 Spoke with patient's wife, Cathlean (OK per [facility]). Shared Dr. Adrian recommendation:  Would stop the eliquis  and start xarelto 15 ,mg daily, and give at night after eating supper with food on the stomach. GT    Xarelto 15 mg daily [facility] in [place].  Cathlean asked if there is any labs that would be needed after being on Xarelto for a month, and if patient would need to stop verapamil .   Drug-Drug: Rivaroxaban and verapamilModerate inhibitors of CYP3A4 and P-glycoprotein (eg, Inhibitors of CYP3A4 (Moderate) and P-glycoprotein) may increase the serum concentration of rivaroxaban.  Will forward to Dr. Waddell to review and advise on follow-up labs and if patient should continue verapamil .

## 2023-12-03 ENCOUNTER — Other Ambulatory Visit (HOSPITAL_COMMUNITY): Payer: Self-pay

## 2023-12-03 NOTE — Telephone Encounter (Signed)
 Unfortunately, all the PAP options for DOAC's require patient to meet a 3-4% out of pocket spending on drugs for 2025 based on their annual income. Patient would likely benefit from setting up a Medicare payment plan with their insurance. Patient can contact the customer service number on the back of their insurance card and request to setup a payment plan.

## 2023-12-03 NOTE — Telephone Encounter (Signed)
 Wife called to say the medication is too expensive. Please advise

## 2023-12-04 MED ORDER — RIVAROXABAN 15 MG PO TABS: 15.0000 mg | ORAL_TABLET | Freq: Every day | ORAL | 3 refills | Status: DC

## 2023-12-04 NOTE — Telephone Encounter (Signed)
 Spoke with [facility] who states it is fine for pt to take Verapamil  and Xarelto.

## 2023-12-04 NOTE — Telephone Encounter (Signed)
 Spoke with pt's wife, DPR and advised will send Rx to [facility] to see if medication can be covered at a lower cost.  Pt's wife verbalizes understanding and agrees with current plan.

## 2023-12-04 NOTE — Telephone Encounter (Signed)
 Patient has [facility] coverage. Have they tried to get medication from the [facility]?

## 2023-12-15 ENCOUNTER — Ambulatory Visit (INDEPENDENT_AMBULATORY_CARE_PROVIDER_SITE_OTHER): Admitting: Otolaryngology

## 2023-12-18 ENCOUNTER — Telehealth: Payer: Self-pay

## 2023-12-18 ENCOUNTER — Ambulatory Visit: Payer: Medicare HMO | Admitting: Urology

## 2023-12-18 DIAGNOSIS — N138 Other obstructive and reflux uropathy: Secondary | ICD-10-CM

## 2023-12-18 DIAGNOSIS — R351 Nocturia: Secondary | ICD-10-CM

## 2023-12-18 NOTE — Telephone Encounter (Signed)
 Called pt to let her know per verbal from MD McKenzie pt can do a urine drop off and pt needs to continue taking alfuzosin . Pt wife voiced her understanding and was scheduled for UA drop off pt wife states its hard for pt to give ua sample but she will collect it keep it refrigerated and drop off the next day. Per lab tech that's fine

## 2023-12-18 NOTE — Telephone Encounter (Signed)
 Patient has dementia. He would not come to visit. Wife is asking if she could bring a urine specimen. Also, has questions regarding alfuzosin .  Please advise.  Call:  [phone]

## 2023-12-18 NOTE — Telephone Encounter (Signed)
See other telephone encounter for details.

## 2023-12-22 ENCOUNTER — Other Ambulatory Visit

## 2023-12-22 DIAGNOSIS — R351 Nocturia: Secondary | ICD-10-CM

## 2023-12-22 NOTE — Telephone Encounter (Signed)
 Called pt wife to schedule f/u per MD recommendations pt wife confirmed upcoming appt but stated she would do her best to get him to his appt but pt has dementia

## 2023-12-23 LAB — URINALYSIS, ROUTINE W REFLEX MICROSCOPIC
Bilirubin, UA: NEGATIVE
Glucose, UA: NEGATIVE
Ketones, UA: NEGATIVE
Nitrite, UA: NEGATIVE
Protein,UA: NEGATIVE
RBC, UA: NEGATIVE
Specific Gravity, UA: 1.015 (ref 1.005–1.030)
Urobilinogen, Ur: 0.2 mg/dL (ref 0.2–1.0)
pH, UA: 6.5 (ref 5.0–7.5)

## 2023-12-23 LAB — MICROSCOPIC EXAMINATION: Bacteria, UA: NONE SEEN

## 2023-12-24 NOTE — Telephone Encounter (Signed)
 Patients wife, Cathlean states that eliquis  is still to expensive, $540ish/month. She is looking for other alternatives.

## 2023-12-25 ENCOUNTER — Telehealth: Payer: Self-pay | Admitting: Pharmacy Technician

## 2023-12-25 ENCOUNTER — Other Ambulatory Visit (HOSPITAL_COMMUNITY): Payer: Self-pay

## 2023-12-25 ENCOUNTER — Telehealth: Payer: Self-pay | Admitting: Internal Medicine

## 2023-12-25 MED ORDER — RIVAROXABAN 15 MG PO TABS: 15.0000 mg | ORAL_TABLET | Freq: Every day | ORAL | 3 refills | Status: DC

## 2023-12-25 NOTE — Telephone Encounter (Signed)
 Patient is actually requesting Xarelto due to daily dosing. Rx sent to [facility]

## 2023-12-25 NOTE — Telephone Encounter (Signed)
 Patient was going to call and check with the [facility] to see about the price.

## 2023-12-25 NOTE — Addendum Note (Signed)
 Addended by: Sascha Baugher D on: 12/25/2023 01:28 PM   Modules accepted: Orders

## 2023-12-25 NOTE — Telephone Encounter (Addendum)
 Reached out to patient via mychart   No answer on phone

## 2023-12-25 NOTE — Telephone Encounter (Signed)
 Patient's wife said that patient has dementia and that she cannot always wake him twice a day to take the eliquis  so she wants him to switch to the xarelto or something once daily and it be sent to the [facility] so that they can mail it to her cheaper.

## 2023-12-25 NOTE — Telephone Encounter (Signed)
*  STAT* If patient is at the pharmacy, call can be transferred to refill team.   1. Which medications need to be refilled? (please list name of each medication and dose if known)   Rivaroxaban (XARELTO) 15 MG TABS tablet     2. Would you like to learn more about the convenience, safety, & potential cost savings by using the [facility]? no   3. Are you open to using the [facility] (Type [facility].  ). no   4. Which pharmacy/location (including street and city if local pharmacy) is medication to be sent to?[facility] - [place], [place] - [address]    5. Do they need a 30 day or 90 day supply? 30 day   Pt spouse states that the RX needs to be faxed to Dr Rockey with current medication and what pt is taking medication for please advise

## 2023-12-29 ENCOUNTER — Telehealth: Payer: Self-pay | Admitting: Internal Medicine

## 2023-12-29 ENCOUNTER — Other Ambulatory Visit: Payer: Self-pay

## 2023-12-29 MED ORDER — RIVAROXABAN 15 MG PO TABS: 15.0000 mg | ORAL_TABLET | Freq: Every day | ORAL | 0 refills | Status: DC

## 2023-12-29 NOTE — Telephone Encounter (Signed)
 Pt c/o medication issue:  1. Name of Medication:   Rivaroxaban (XARELTO) 15 MG TABS tablet    2. How are you currently taking this medication (dosage and times per day)? As written   3. Are you having a reaction (difficulty breathing--STAT)? No   4. What is your medication issue?  Pt spouse called in stating pt doctor at the [facility] would like to know why pt was started on Xarelto. She asked if it can be attn to Dr. Rockey. Please advise.    Fax: [phone]

## 2023-12-29 NOTE — Telephone Encounter (Signed)
 Faxed conversation in chart with rx med assistance and pharmacy as to why pt is on Xarelto to number provided by pt's wife at the attn of Dr. Rockey.

## 2023-12-29 NOTE — Telephone Encounter (Signed)
 Faxed to [facility] 12/29/23

## 2024-01-08 ENCOUNTER — Ambulatory Visit: Admitting: Family Medicine

## 2024-01-16 ENCOUNTER — Other Ambulatory Visit: Payer: Self-pay | Admitting: Internal Medicine

## 2024-01-27 ENCOUNTER — Encounter: Payer: Self-pay | Admitting: Family Medicine

## 2024-01-27 ENCOUNTER — Ambulatory Visit: Admitting: Family Medicine

## 2024-01-27 VITALS — BP 136/85 | HR 67 | Ht 69.0 in | Wt 219.0 lb

## 2024-01-27 DIAGNOSIS — F03B18 Unspecified dementia, moderate, with other behavioral disturbance: Secondary | ICD-10-CM | POA: Diagnosis not present

## 2024-01-27 DIAGNOSIS — G4719 Other hypersomnia: Secondary | ICD-10-CM | POA: Diagnosis not present

## 2024-01-27 NOTE — Patient Instructions (Signed)
 Below is our plan:  We will continue donepezil  mg daily   We will send a palliative care consult. Listen for them to call.   Ask your cardiologist about taking modafinil  to see if it would give him a little energy.   Please make sure you are staying well hydrated. I recommend 50-60 ounces daily. Well balanced diet and regular exercise encouraged. Consistent sleep schedule with 6-8 hours recommended.   Please continue follow up with care team as directed.   Follow up with me pending palliative care consult.  You may receive a survey regarding today's visit. I encourage you to leave honest feed back as I do use this information to improve patient care. Thank you for seeing me today!   Management of Memory Problems   There are some general things you can do to help manage your memory problems.  Your memory may not in fact recover, but by using techniques and strategies you will be able to manage your memory difficulties better.   1)  Establish a routine. Try to establish and then stick to a regular routine.  By doing this, you will get used to what to expect and you will reduce the need to rely on your memory.  Also, try to do things at the same time of day, such as taking your medication or checking your calendar first thing in the morning. Think about think that you can do as a part of a regular routine and make a list.  Then enter them into a daily planner to remind you.  This will help you establish a routine.   2)  Organize your environment. Organize your environment so that it is uncluttered.  Decrease visual stimulation.  Place everyday items such as keys or cell phone in the same place every day (ie.  Basket next to front door) Use post it notes with a brief message to yourself (ie. Turn off light, lock the door) Use labels to indicate where things go (ie. Which cupboards are for food, dishes, etc.) Keep a notepad and pen by the telephone to take messages   3)  Memory Aids A diary  or journal/notebook/daily planner Making a list (shopping list, chore list, to do list that needs to be done) Using an alarm as a reminder (kitchen timer or cell phone alarm) Using cell phone to store information (Notes, Calendar, Reminders) Calendar/White board placed in a prominent position Post-it notes   In order for memory aids to be useful, you need to have good habits.  It's no good remembering to make a note in your journal if you don't remember to look in it.  Try setting aside a certain time of day to look in journal.   4)  Improving mood and managing fatigue. There may be other factors that contribute to memory difficulties.  Factors, such as anxiety, depression and tiredness can affect memory. Regular gentle exercise can help improve your mood and give you more energy. Exercise: there are short videos created by the [employer] on Health specially for older adults: https://bit.ly/2I30q97.  Mediterranean diet: which emphasizes fruits, vegetables, whole grains, legumes, fish, and other seafood; unsaturated fats such as olive oils; and low amounts of red meat, eggs, and sweets. A variation of this, called MIND (Mediterranean-DASH Intervention for Neurodegenerative Delay) incorporates the DASH (Dietary Approaches to Stop Hypertension) diet, which has been shown to lower high blood pressure, a risk factor for Alzheimer's disease. More information at: exitmarketing.de.  Aerobic exercise that improve heart  health is also good for the mind.  [employer] on Aging have short videos for exercises that you can do at home: Blindworkshop.com.pt Simple relaxation techniques may help relieve symptoms of anxiety Try to get back to completing activities or hobbies you enjoyed doing in the past. Learn to pace yourself through activities to decrease fatigue. Find out about some local support groups where you can  share experiences with others. Try and achieve 7-8 hours of sleep at night.   Resources for Family/Caregiver  Online caregiver support groups can be found at westerntunes.it or call Alzheimer's Association's 24/7 hotline: (207)648-9015. [facility] Counsel[facility] Program offers in-person, virtual support groups and individual counseling for both care partners and persons with memory loss. Call for more information at [phone].   Advanced care plan: there are two types of Power of Attorney: healthcare and durable. Healthcare POA is a designated person to make healthcare decisions on your behalf if you were too sick to make them yourself. This person can be selected and documented by your physician. Durable POA has to be set up with a lawyer who takes charge of your finances and estate if you were too sick or cognitively impaired to manage your finances accurately. You can find a local Elder Therapist, art here: newportranch.at.  Check out www.planyourlifespan.org, which will help you plan before a crisis and decide who will take care of life considerations in a circumstance where you may not be able to speak for yourself.   Helpful books (available on [employer] or your local bookstore):  By Dr. Katherene Gentry: Keeping Love Alive as Memories Fade: The 5 Love Languages and the Alzheimer's Journey Nov 25, 2014 The Dementia Care Partner's Workbook: A Guide for Understanding, Education, and [employer] - July 25, 2017.  Both available for less than $15.   Coping with behavior change in dementia: a family caregiver's guide by Landry Mirza & Mitzie Pizza A Caregiver's Guide to Dementia: Using Activities and Other Strategies to Prevent, Reduce and Manage Behavioral Symptoms by Leita SAILOR. [place] and Catherine Piersol.  Creating Moments of Joy for the Person with Alzheimer's or Dementia 4th edition by Melanie Mings  Caregiver videos on common behaviors related to dementia:  populationgame.pl  [place] Caregiver Portal: free to sign up, links to local resources: [email or web address].[email or web address]

## 2024-01-27 NOTE — Progress Notes (Signed)
 Chief Complaint  Patient presents with   Memory Loss    RM 1 with spouse and caregiver  Pt is well, caregiver reports memory has declined since last visit      HISTORY OF PRESENT ILLNESS:  01/27/24 ALL:  Juan Quinn is a 89 y.o. adult here today for follow up for dementia. He was seen in consult with Dr Vear 04/2023. MMSE 13/30. ATN profile showed elevated [id number] but normal beta-amyloid ratio. Likely vascular etiology. He was started on modafinil  and donepezil .   Since, memory continues to decline. He has tolerated donepezil  but not sure it has helped. Wife did not start modafinil . He stays confused most of the time. He is very hard of hearing He is sleeping most all the time. He slept 22 of the past 24 hours. He does not want to move. He may stay up from about 4p-8p but he goes right back to bed. Drinking less. Eats one meal per day. He has a caregiver 4 days a week. Lives with wife.    HISTORY (copied from Dr Duncan previous note)  I had the pleasure seeing patient, Juan Quinn, at [facility] for neurologic consultation regarding his cognitive decline.   He is here with his wife and niece.    He is a 89 year old man.   He denies any issues with his memory but notes he does not keep up with dates.    He denies any hobbies and spends most days siting in his recliner most of the day and also watching TV.    He sleeps 16-20 hours a day in bed and the recliner.    His wife notes he started to have cognitive decline about 18 months ago but daughter feels cognitive decline started more than 2 years ago.    His daughter notes he used to be social and played card games (now does much less).    He does not play as well as he used to.    He does not dress himself without help.  He cannot follow complex instructions.          Daughter notes his personality has changed and he is now very apathetic.   He needs reminders for personal care.  He gets frustrated and is argumentative.    He can say hurtful things to his daughter.   There is no physical abuse.   He craves sweets more.         He is physcally much less active and his gait has changed.   He has had a couple falls.    He presented to the emergency room on 04/01/2023 due to a fall.  CT scan of the head showed advanced generalized cortical atrophy and advanced chronic microvascular ischemic changes.  Degenerative changes were noted in the cervical spine.  There were no acute findings in the brain or spine.  The patient left without being seen.       05/04/2023   10:34 AM  MMSE - Mini Mental State Exam  Orientation to time 0  Orientation to Place 2  Registration 3  Attention/ Calculation 1  Recall 0  Language- name 2 objects 2  Language- repeat 0  Language- follow 3 step command 3  Language- read & follow direction 1  Write a sentence 1  Copy design 0  Total score 13      He is very sleepy and sleeps 16-20 hours many days.    He snores.  He would not use CPAP if he had OSA.      Vascular risks:   HTN, AFib (on ELiquis )   Imaging: CT scan of the head 04/01/2023 showed advanced generalized cortical atrophy.  The ventricles were enlarged in proportion to the atrophy.  Advanced chronic microvascular ischemic changes are also noted in the cerebral hemispheres.  There were no acute findings.   CT scan of the cervical spine 04/01/2023 showed mild multilevel degenerative changes but there did not appear to be any acute findings.  No spinal stenosis is noted.   REVIEW OF SYSTEMS: Out of a complete 14 system review of symptoms, the patient complains only of the following symptoms, memory loss, hard of hearing, and all other reviewed systems are negative.   ALLERGIES: No Known Allergies   HOME MEDICATIONS: Outpatient Medications Prior to Visit  Medication Sig Dispense Refill   acetaminophen  (TYLENOL ) 500 MG tablet Take 1,000 mg by mouth at bedtime. May take additional  1000 mg of needed     alfuzosin   (UROXATRAL ) 10 MG 24 hr tablet Take 1 tablet (10 mg total) by mouth at bedtime. 90 tablet 3   Cholecalciferol  (VITAMIN D3) 2000 units TABS Take 6,000 Units by mouth daily.     donepezil  (ARICEPT ) 10 MG tablet Take 1 tablet (10 mg total) by mouth daily. 90 tablet 3   furosemide  (LASIX ) 20 MG tablet TAKE 1 TABLET BY MOUTH EVERY DAY 90 tablet 3   ipratropium (ATROVENT ) 0.06 % nasal spray Place 2 sprays into both nostrils daily as needed for rhinitis.      losartan  (COZAAR ) 25 MG tablet TAKE 1 TABLET (25 MG TOTAL) BY MOUTH DAILY. 90 tablet 3   modafinil  (PROVIGIL ) 100 MG tablet Take 1 tablet (100 mg total) by mouth daily. 30 tablet 5   Polyethyl Glycol-Propyl Glycol (SYSTANE OP) Apply 1 drop to eye 4 (four) times daily as needed (dry eyes).     Rivaroxaban  (XARELTO ) 15 MG TABS tablet Take 1 tablet (15 mg total) by mouth daily with supper. 90 tablet 0   sertraline (ZOLOFT) 25 MG tablet Take 1 tablet by mouth daily.     simvastatin  (ZOCOR ) 10 MG tablet Take 1 tablet (10 mg total) by mouth daily. 90 tablet 3   verapamil  (CALAN -SR) 180 MG CR tablet TAKE 1 TABLET BY MOUTH TWICE A DAY 180 tablet 0   No facility-administered medications prior to visit.     PAST MEDICAL HISTORY: Past Medical History:  Diagnosis Date   AAA (abdominal aortic aneurysm)    Adenomatous colon polyp 2000   Surveillance due 2017   Atrial fibrillation (HCC)    Atrial flutter (HCC)    a. diagnosed in 12/2017   Basal cell carcinoma 08/15/2020   left knee- anterior (CX35FU)   Basal cell carcinoma 08/15/2020   left forearm-posterior (CX35FU)   Basal cell carcinoma 01/23/2022   BCC (basal cell carcinoma of skin) 04/27/2019   Top Right Shoulder (curet, cautery and 5FU)   BCC (basal cell carcinoma of skin) 08/15/2020   left preauricular area (Cx35FU)   Chronic diarrhea    Gout    Heart murmur    Helicobacter pylori antibody positive 2015   treatment with Prevpac   History of stomach ulcers    HTN (hypertension)     Hypercholesteremia    Nodular basal cell carcinoma 07/17/2016   Left Shoulder (curet)   Nodular basal cell carcinoma 11/11/2017   Right Post Auricular (treatment after biopsy)   Nodular basal cell carcinoma  09/28/2018   Right Sideburn (treatment after biopsy)   Nodular basal cell carcinoma 09/28/2018   Right Front Neck (treatment after biopsy)   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   in situ- left neck (Cx35FU)   SCC (squamous cell carcinoma) 10/26/2014   Right Ear(in situ) (curet, cautery, and 5FU)   SCC (squamous cell carcinoma) 10/26/2014   Right Upper Lip(in situ) (LN2 60 second freeze)   SCC (squamous cell carcinoma) 07/01/2017   Left Sideburn(in situ) (treatment after biopsy)   SCC (squamous cell carcinoma) 07/01/2017   Right Inner Cheek(in situ) (treatment after biopsy)   SCC (squamous cell carcinoma) 11/11/2017   Right Inner Cheek Inf.(in situ) (treatment after biopsy)   SCC (squamous cell carcinoma) 09/28/2018   Right Helix Mid(in situ) (treatment after biopsy)   SCC (squamous cell carcinoma) 09/28/2018   Right Inner Cheek,Inf(in situ) (treatment after biopsy)   SCC (squamous cell carcinoma) 04/27/2019   Tip of right nose(in situ) - CX3+5FU   Squamous cell carcinoma of skin 10/26/2014   Below Left Ear(in situ) (curet, cautery, and 5FU)   Superficial basal cell carcinoma 10/26/2014   Top Right Foot (treatment after biopsy)   Superficial nodular basal cell carcinoma 10/26/2014   Right Forearm (treatment after biopsy)     PAST SURGICAL HISTORY: Past Surgical History:  Procedure Laterality Date   BIOPSY  10/24/2016   Procedure: BIOPSY;  Surgeon: Shaaron Lamar HERO, MD;  [facility]: [facility];  Service: Endoscopy;;  colon   BIOPSY  01/06/2018   Procedure: BIOPSY;  Surgeon: Golda Claudis PENNER, MD;  [facility]: [facility];  Service: Endoscopy;;  duodenal   COLONOSCOPY  07/2010   Dr. Shaaron: adenomatous polyps, due for surveillance if health  permits in 2017   COLONOSCOPY N/A 10/24/2016   Rourk: diverticulosis in entire examined colon, 4mm polyp tubular adenoma, 5mm polyp tubulovillous adenoma, 12mm polyp bening colonic mucosa, internal hemmorhoids   ESOPHAGOGASTRODUODENOSCOPY N/A 05/24/2013   Dr. Rourk:Prepyloric gastric ulcerations-likely source of hematemesis-likely NSAID-related. H.PYLORI SEROLOGY POSITIVE, TREATED WITH PREVPAC   ESOPHAGOGASTRODUODENOSCOPY N/A 05/23/2013   MFM:Pwrnfeozuz EGD secondary to much clot and food debris in the stomach   ESOPHAGOGASTRODUODENOSCOPY N/A 08/16/2013   Dr. Shaaron: normal esophagus, stomach empty, deformity of antrum. scar present. Previously noted ulcers completed healed. Patent pylorus, normal D1 and D2   ESOPHAGOGASTRODUODENOSCOPY N/A 01/06/2018   Procedure: ESOPHAGOGASTRODUODENOSCOPY (EGD);  Surgeon: Golda Claudis PENNER, MD;  [facility]: [facility];  Service: Endoscopy;  Laterality: N/A;   HEMORRHOID SURGERY     PACEMAKER IMPLANT N/A 11/15/2020   Procedure: PACEMAKER IMPLANT;  Surgeon: Waddell Danelle ORN, MD;  [facility]: [facility];  Service: Cardiovascular;  Laterality: N/A;   POLYPECTOMY  10/24/2016   Procedure: POLYPECTOMY;  Surgeon: Shaaron Lamar HERO, MD;  Location: [facility];  Service: Endoscopy;;  colon   shoulder       FAMILY HISTORY: Family History  Problem Relation Age of Onset   Stroke Mother    Stroke Father    Pancreatic cancer Sister    Leukemia Brother    Lung cancer Sister    Colon cancer Neg Hx      SOCIAL HISTORY: Social History   Socioeconomic History   Marital status: Married    Spouse name: Not on file   Number of children: 4   Years of education: Not on file   Highest education level: Not on file  Occupational History   Occupation: retired    Comment: airline pilot  Tobacco  Use   Smoking status: Former    Current packs/day: 0.00    Average packs/day: 2.0 packs/day for 50.0 years (100.0 ttl pk-yrs)    Types: Cigarettes    Start date: 07/29/1950     Quit date: 07/28/2000    Years since quitting: 23.5   Smokeless tobacco: Former    Quit date: 02/25/1999  Vaping Use   Vaping status: Never Used  Substance and Sexual Activity   Alcohol use: No    Comment: couple glasses wine/week, occ beer or rmixed drink   Drug use: No   Sexual activity: Not on file  Other Topics Concern   Not on file  Social History Narrative   Lives w/ wife         Social Drivers of Corporate Investment Banker Strain: Not on file  Food Insecurity: Not on file  Transportation Needs: Not on file  Physical Activity: Not on file  Stress: Not on file  Social Connections: Not on file  Intimate Partner Violence: Not At Risk (08/03/2023)   Received from [facility]   HITS    Over the last 12 months how often did your partner physically hurt you?: Never    Over the last 12 months how often did your partner insult you or talk down to you?: Never    Over the last 12 months how often did your partner threaten you with physical harm?: Never    Over the last 12 months how often did your partner scream or curse at you?: Never     PHYSICAL EXAM  Vitals:   01/27/24 1405  BP: 136/85  Pulse: 67  Weight: 219 lb (99.3 kg)  Height: 5' 9 (1.753 m)   Body mass index is 32.34 kg/m.  Generalized: Well developed, in no acute distress  Cardiology: normal rate and rhythm, no murmur auscultated  Respiratory: clear to auscultation bilaterally    Neurological examination  Mentation: Alert oriented to time, place, history taking. Follows all commands speech and language fluent Cranial nerve II-XII: Pupils were equal round reactive to light. Extraocular movements were full, visual field were full on confrontational test. Facial sensation and strength were normal. Uvula tongue midline. Head turning and shoulder shrug  were normal and symmetric. Motor: The motor testing reveals 5 over 5 strength of all 4 extremities. Good symmetric motor tone is noted throughout.  Sensory:  Sensory testing is intact to soft touch on all 4 extremities. No evidence of extinction is noted.  Coordination: Cerebellar testing reveals good finger-nose-finger and heel-to-shin bilaterally.  Gait and station: Gait is normal. Tandem gait is normal. Romberg is negative. No drift is seen.  Reflexes: Deep tendon reflexes are symmetric and normal bilaterally.    DIAGNOSTIC DATA (LABS, IMAGING, TESTING) - I reviewed patient records, labs, notes, testing and imaging myself where available.  Lab Results  Component Value Date   WBC 9.1 12/09/2021   HGB 13.9 12/09/2021   HCT 40.2 12/09/2021   MCV 102 (H) 12/09/2021   PLT 153 12/09/2021      Component Value Date/Time   NA 142 12/09/2021 1552   K 4.3 12/09/2021 1552   CL 106 12/09/2021 1552   CO2 22 12/09/2021 1552   GLUCOSE 104 (H) 12/09/2021 1552   GLUCOSE 100 (H) 11/08/2020 1049   BUN 18 12/09/2021 1552   CREATININE 1.16 12/09/2021 1552   CREATININE 1.09 12/15/2019 1536   CALCIUM 9.3 12/09/2021 1552   PROT 6.1 10/21/2018 0948   ALBUMIN 4.0 01/05/2018 1001  AST 14 10/21/2018 0948   ALT 10 10/21/2018 0948   ALKPHOS 63 01/05/2018 1001   BILITOT 0.6 10/21/2018 0948   GFRNONAA >60 11/08/2020 1049   GFRNONAA 70 12/25/2016 1258   GFRAA >60 07/18/2018 1837   GFRAA 81 12/25/2016 1258   Lab Results  Component Value Date   CHOL 192 10/21/2018   HDL 55 10/21/2018   LDLCALC 111 (H) 10/21/2018   TRIG 148 10/21/2018   CHOLHDL 3.5 10/21/2018   No results found for: HGBA1C Lab Results  Component Value Date   VITAMINB12 342 05/04/2023   Lab Results  Component Value Date   TSH 3.720 05/04/2023       05/04/2023   10:34 AM  MMSE - Mini Mental State Exam  Orientation to time 0  Orientation to Place 2  Registration 3  Attention/ Calculation 1  Recall 0  Language- name 2 objects 2  Language- repeat 0  Language- follow 3 step command 3  Language- read & follow direction 1  Write a sentence 1  Copy design 0  Total score 13          No data to display           ASSESSMENT AND PLAN  89 y.o. year old adult  has a past medical history of AAA (abdominal aortic aneurysm), Adenomatous colon polyp (2000), Atrial fibrillation (HCC), Atrial flutter (HCC), Basal cell carcinoma (08/15/2020), Basal cell carcinoma (08/15/2020), Basal cell carcinoma (01/23/2022), BCC (basal cell carcinoma of skin) (04/27/2019), BCC (basal cell carcinoma of skin) (08/15/2020), Chronic diarrhea, Gout, Heart murmur, Helicobacter pylori antibody positive (2015), History of stomach ulcers, HTN (hypertension), Hypercholesteremia, Nodular basal cell carcinoma (07/17/2016), Nodular basal cell carcinoma (11/11/2017), Nodular basal cell carcinoma (09/28/2018), Nodular basal cell carcinoma (09/28/2018), 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 here with    Moderate dementia with other behavioral disturbance, unspecified dementia type (HCC) - Plan: Amb Referral to Palliative Care  Excessive daytime sleepiness  Juan Quinn continue to decline. Unable to complete MMSE, today. We will continue donepezil  10mg  daily. I have advised she contact Dr Waddell, cardiology, if concerned about modafinil  having adverse effects on heart. I feel it is safe to consider a low dose if it would help him become more active. We have discussed progressive nature of dementia. Palliative care consult placed. Healthy lifestyle habits encouraged. He will follow up  with PCP as directed. He will return to see me as needed. Mrs Juan Quinn understanding and agreement with this plan.   Orders Placed This Encounter  Procedures   Amb Referral to Palliative Care    Referral Priority:   Routine    Referral Type:   Consultation    Number of Visits Requested:   1     No orders of the defined types were placed in this encounter.  I personally spent a total of 40 minutes in the care of the patient today including preparing to see the patient, getting/reviewing separately obtained history, performing a medically appropriate exam/evaluation, counseling and educating, referring and communicating with other health care professionals, documenting clinical information in the EHR, independently interpreting results, and communicating results.  Juan Forbes, MSN, FNP-C 01/27/2024, 3:46 PM  [facility] [address], [address] [place], [place] [address] [phone]

## 2024-02-11 ENCOUNTER — Ambulatory Visit

## 2024-02-11 DIAGNOSIS — I495 Sick sinus syndrome: Secondary | ICD-10-CM

## 2024-02-11 LAB — CUP PACEART REMOTE DEVICE CHECK
Battery Remaining Longevity: 88 mo
Battery Remaining Percentage: 74 %
Battery Voltage: 2.99 V
Brady Statistic AP VP Percent: 1 %
Brady Statistic AP VS Percent: 1.4 %
Brady Statistic AS VP Percent: 8.7 %
Brady Statistic AS VS Percent: 90 %
Brady Statistic RA Percent Paced: 1 %
Brady Statistic RV Percent Paced: 13 %
Date Time Interrogation Session: 20251218031934
Implantable Lead Connection Status: 753985
Implantable Lead Connection Status: 753985
Implantable Lead Implant Date: 20220922
Implantable Lead Implant Date: 20220922
Implantable Lead Location: 753859
Implantable Lead Location: 753860
Implantable Pulse Generator Implant Date: 20220922
Lead Channel Impedance Value: 510 Ohm
Lead Channel Impedance Value: 510 Ohm
Lead Channel Pacing Threshold Amplitude: 1 V
Lead Channel Pacing Threshold Amplitude: 1.25 V
Lead Channel Pacing Threshold Pulse Width: 0.4 ms
Lead Channel Pacing Threshold Pulse Width: 0.4 ms
Lead Channel Sensing Intrinsic Amplitude: 11.2 mV
Lead Channel Sensing Intrinsic Amplitude: 5 mV
Lead Channel Setting Pacing Amplitude: 2 V
Lead Channel Setting Pacing Amplitude: 3 V
Lead Channel Setting Pacing Pulse Width: 0.4 ms
Lead Channel Setting Sensing Sensitivity: 2 mV
Pulse Gen Model: 2272
Pulse Gen Serial Number: 3964503

## 2024-02-12 NOTE — Progress Notes (Signed)
 Remote PPM Transmission

## 2024-02-19 ENCOUNTER — Ambulatory Visit: Payer: Self-pay | Admitting: Student in an Organized Health Care Education/Training Program

## 2024-03-03 ENCOUNTER — Encounter: Payer: Self-pay | Admitting: Family Medicine

## 2024-03-04 ENCOUNTER — Ambulatory Visit
Attending: Student in an Organized Health Care Education/Training Program | Admitting: Student in an Organized Health Care Education/Training Program

## 2024-03-04 ENCOUNTER — Encounter: Payer: Self-pay | Admitting: Student in an Organized Health Care Education/Training Program

## 2024-03-04 VITALS — BP 89/62 | HR 90 | Ht 69.0 in | Wt 218.0 lb

## 2024-03-04 DIAGNOSIS — I4811 Longstanding persistent atrial fibrillation: Secondary | ICD-10-CM | POA: Diagnosis not present

## 2024-03-04 DIAGNOSIS — I495 Sick sinus syndrome: Secondary | ICD-10-CM

## 2024-03-04 DIAGNOSIS — I10 Essential (primary) hypertension: Secondary | ICD-10-CM

## 2024-03-04 LAB — CUP PACEART INCLINIC DEVICE CHECK
Battery Remaining Longevity: 99 mo
Battery Voltage: 2.99 V
Brady Statistic RA Percent Paced: 0.13 %
Brady Statistic RV Percent Paced: 12 %
Date Time Interrogation Session: 20260109174020
Implantable Lead Connection Status: 753985
Implantable Lead Connection Status: 753985
Implantable Lead Implant Date: 20220922
Implantable Lead Implant Date: 20220922
Implantable Lead Location: 753859
Implantable Lead Location: 753860
Implantable Pulse Generator Implant Date: 20220922
Lead Channel Impedance Value: 525 Ohm
Lead Channel Impedance Value: 525 Ohm
Lead Channel Pacing Threshold Amplitude: 1.25 V
Lead Channel Pacing Threshold Amplitude: 1.25 V
Lead Channel Pacing Threshold Pulse Width: 0.4 ms
Lead Channel Pacing Threshold Pulse Width: 0.4 ms
Lead Channel Sensing Intrinsic Amplitude: 12 mV
Lead Channel Sensing Intrinsic Amplitude: 4.6 mV
Lead Channel Setting Pacing Amplitude: 2 V
Lead Channel Setting Pacing Amplitude: 3 V
Lead Channel Setting Pacing Pulse Width: 0.4 ms
Lead Channel Setting Sensing Sensitivity: 2 mV
Pulse Gen Model: 2272
Pulse Gen Serial Number: 3964503

## 2024-03-04 MED ORDER — LOSARTAN POTASSIUM 25 MG PO TABS: 25.0000 mg | ORAL_TABLET | Freq: Every day | ORAL | 3 refills | Status: AC

## 2024-03-04 MED ORDER — RIVAROXABAN 15 MG PO TABS: 15.0000 mg | ORAL_TABLET | Freq: Every day | ORAL | 3 refills | Status: AC

## 2024-03-04 NOTE — Progress Notes (Unsigned)
 " Cardiology Office Note   Date: 03/04/24 ID:  Juan Quinn, DOB February 04, 1931, MRN [id number] PCP: Juan Norleen PEDLAR, MD  [facility] Providers Cardiologist:  Juan Birmingham, MD Electrophysiologist:  Juan DELENA Primus, MD   History of Present Illness Juan Quinn is a 89 y.o. adult with advanced dementia, persistent AF, SSS s/p LEFT Abbott DC PPM who presents for arrhythmia and device management.   Discussed the use of AI scribe software for clinical note transcription with the patient, who gave verbal consent to proceed.  History of Present Illness Juan Quinn is a 89 year old male with atrial fibrillation and dementia who presents with increased fatigue and issues with medication management.  He has been experiencing increased fatigue, sleeping up to 20 hours a day, with this change in sleep pattern noted over the past several months and significantly increasing in the last three to six months. His family member reports difficulty waking him to take his medications, raising concerns about his current treatment regimen.  He has a history of atrial fibrillation and has been on Eliquis  for several years. Attempts to switch him to Xarelto  have been [facility]. He is currently taking Eliquis  once a day instead of the prescribed twice daily due to his excessive sleepiness and not taking his evening medications.  His medical history is significant for dementia, which has been progressively worsening. He is on donepezil  for dementia management. There is a family history of dementia, as his mother also had the condition. He is not currently on modafinil , which was considered for his fatigue, but concerns about interactions with his heart medications were raised.  He has a pacemaker and has been in atrial fibrillation for over two years. His heart rate is generally controlled, with rates in the 80s and 90s, but there are occasional higher rates. He has been in atrial  fibrillation since 2022.  His current medications include sertraline, donepezil , and Eliquis . The family member assisting with his care has expressed concerns about the cost and accessibility of Xarelto  through the [facility], as well as the impact of his medication regimen on his quality of life.  ROS: fatigue, somnolence   Studies Reviewed  ECG review 03/04/24:  AF/VR 90, QRS 132, QT/c 400/489, RBBB/LAFB 11/15/20: AF/ VR 70, QRS 126, QT/c 436/470, RBBB/LAFB 07/18/18: (21:21:36) NSR 64, PR 184 (manual), QRS 142, QT/c 450/465, RBBB/LAFB 07/18/18: (18:20:55) AFL/RVR 124, QRS 119, QT/c 356/512, RBBB/LAFB 07/02/18: AF/RVR 132, QRS 133, QT/c 351/521, RBBB/LAFB 01/05/18: AFL/RVR 142, QRS 129, QT/c 351/540, RBBB/LAFB 05/24/13: AFL/RVR 158, QRS 108, QT/c 264/428, LAFB  TTE Result date: 12/26/21  1. Left ventricular ejection fraction, by estimation, is 45 to 50%. The  left ventricle has mildly decreased function. The left ventricle  demonstrates global hypokinesis. There is mild concentric left ventricular  hypertrophy. Left ventricular diastolic  parameters are indeterminate.   2. Right ventricular systolic function is mildly reduced. The right  ventricular size is normal. There is mildly elevated pulmonary artery  systolic pressure. The estimated right ventricular systolic pressure is  44.7 mmHg.   3. Left atrial size was mildly dilated.   4. The mitral valve is normal in structure. Trivial mitral valve  regurgitation. No evidence of mitral stenosis.   5. The aortic valve is tricuspid. There is moderate calcification of the  aortic valve. Aortic valve regurgitation is not visualized. Aortic valve  sclerosis/calcification is present, without any evidence of aortic  stenosis.   6. The inferior  vena cava is dilated in size with >50% respiratory  variability, suggesting right atrial pressure of 8 mmHg.   7. The patient was in atrial fibrillation.   Risk Assessment/Calculations  CHA2DS2-VASc  Score = 4  This indicates a 4.8% annual risk of stroke. The patient's score is based upon: CHF History: 1 HTN History: 1 Diabetes History: 0 Stroke History: 0 Vascular Disease History: 0 Age Score: 2 Gender Score: 0  Physical Exam VS:  BP (!) 89/62 (BP Location: Left Arm, Cuff Size: Normal)   Pulse 90   Ht 5' 9 (1.753 m)   Wt 218 lb (98.9 kg)   SpO2 94%   BMI 32.19 kg/m   Wt Readings from Last 3 Encounters:  03/04/24 218 lb (98.9 kg)  01/27/24 219 lb (99.3 kg)  05/04/23 234 lb (106.1 kg)    GEN: Well nourished, well developed in no acute distress CARDIAC: irregular rhythm, rate controlled, no murmurs, rubs, gallops RESPIRATORY:  Clear to auscultation without rales, wheezing or rhonchi  EXTREMITIES:  No edema; No deformity   ASSESSMENT AND PLAN Juan Quinn is a 89 y.o. adult with advanced dementia, persistent AF, SSS s/p LEFT Abbott DC PPM who presents for arrhythmia and device management.  Assessment & Plan Longstanding persistent AF Chronic AFib managed with pacemaker. Stroke prevention prioritized. Eliquis  initially dose reduced due to age and renal function. His wife reports that he is only taking once daily because she can't wake him up at night for the second dose.  For this reason I recommended transitioning to Xarelto  15 mg daily.  This has been recommended before and prescription faxed to the [facility] however there was some issue getting the prescription. I reviewed the notes that I could access from the [facility] and Dr. Denece Quinn is his PCP. In his note he references the Eliquis  and says to follow up with us  for continued prescriptions. Juan Quinn tried to get Xarelto  outside the [facility] but it was >$500/month. The prescription was then faxed to Juan Quinn but his wife was unable to get it filled. I provided a paper prescription for her today to take to the [facility] and asked her to f/u with Juan Quinn in person if there was an issue getting it filled.  We had a long discussion today about his AF  and her concern was whether the AF was driving his sx of excessive sleep/fatigue. I doubt this is the case as he has been in longstanding persistent AF for several years. He has had intermittent NSR on device interrogations but has had an AF burden of 50-90% since device implant. I think that his sx are related to progressive dementia as opposed to AF being the main driver. Most of his sx have started to gradually worsen over the past 6-12 months. She seems overwhelmed with his current trajectory and with his sleep patterns as well as the likelihood that this is progressive dementia. During my exam he didn't interact much at all and she answered all questions for him.  - Attempt to obtain Xarelto  prescription from [facility]. - Continue Eliquis  until Xarelto  is obtained. - Avoid cardioversion or rhythm control procedures.  Sick sinus syndrome with pacemaker Pacemaker effectively managing heart rate, preventing bradycardia. Pacemaker functioning well. - Continue current pacemaker management.  Dispo: RTC 1 yr  Signed, Juan DELENA Primus, MD  "

## 2024-03-04 NOTE — Patient Instructions (Signed)
 Medication Instructions:  Your physician recommends that you continue on your current medications as directed. Please refer to the Current Medication list given to you today.  *If you need a refill on your cardiac medications before your next appointment, please call your pharmacy*  Lab Work: None ordered.  If you have labs (blood work) drawn today and your tests are completely normal, you will receive your results only by: MyChart Message (if you have MyChart) OR A paper copy in the mail If you have any lab test that is abnormal or we need to change your treatment, we will call you to review the results.  Testing/Procedures: None ordered.   Follow-Up: At [facility], you and your health needs are our priority.  As part of our continuing mission to provide you with exceptional heart care, our providers are all part of one team.  This team includes your primary Cardiologist (physician) and Advanced Practice Providers or APPs (Physician Assistants and Nurse Practitioners) who all work together to provide you with the care you need, when you need it.  Your next appointment:   12 months with dr Almetta

## 2024-03-04 NOTE — Progress Notes (Unsigned)
" °  Cardiology Office Note   Date:  03/04/2024  ID:  Juan Quinn, DOB [birth year], MRN [id number] PCP: Shona Norleen PEDLAR, MD  [facility] Providers Cardiologist:  Danelle Birmingham, MD { Click to update primary MD,subspecialty MD or APP then REFRESH:1}    History of Present Illness Juan Quinn is a 89 y.o. adult ***  ROS: ***  Studies Reviewed      *** Risk Assessment/Calculations {Does this patient have ATRIAL FIBRILLATION?:[phone]}         Physical Exam VS:  BP (!) 89/62 (BP Location: Left Arm, Cuff Size: Normal)   Pulse 90   Ht 5' 9 (1.753 m)   Wt 218 lb (98.9 kg)   SpO2 94%   BMI 32.19 kg/m        Wt Readings from Last 3 Encounters:  03/04/24 218 lb (98.9 kg)  01/27/24 219 lb (99.3 kg)  05/04/23 234 lb (106.1 kg)    GEN: Well nourished, well developed in no acute distress NECK: No JVD; No carotid bruits CARDIAC: ***RRR, no murmurs, rubs, gallops RESPIRATORY:  Clear to auscultation without rales, wheezing or rhonchi  ABDOMEN: Soft, non-tender, non-distended EXTREMITIES:  No edema; No deformity   ASSESSMENT AND PLAN ***    {Are you ordering a CV Procedure (e.g. stress test, cath, DCCV, TEE, etc)?   Press F2        :[id number]}  Dispo: ***  Signed, Donnice DELENA Primus, MD  "

## 2024-03-07 ENCOUNTER — Other Ambulatory Visit: Payer: Self-pay | Admitting: Family Medicine

## 2024-03-07 DIAGNOSIS — F03B18 Unspecified dementia, moderate, with other behavioral disturbance: Secondary | ICD-10-CM

## 2024-03-08 ENCOUNTER — Telehealth: Payer: Self-pay | Admitting: Family Medicine

## 2024-03-08 NOTE — Telephone Encounter (Signed)
 Referral for palliative care fax to [facility]. Phone: [phone], Fax: [phone]

## 2024-03-31 ENCOUNTER — Ambulatory Visit (INDEPENDENT_AMBULATORY_CARE_PROVIDER_SITE_OTHER): Admitting: Otolaryngology

## 2024-04-07 ENCOUNTER — Ambulatory Visit (INDEPENDENT_AMBULATORY_CARE_PROVIDER_SITE_OTHER): Admitting: Otolaryngology

## 2024-04-27 ENCOUNTER — Ambulatory Visit: Admitting: Urology

## 2024-05-12 ENCOUNTER — Encounter

## 2024-08-11 ENCOUNTER — Encounter

## 2024-11-10 ENCOUNTER — Encounter
